# Patient Record
Sex: Female | Born: 1960 | Race: White | Hispanic: No | Marital: Married | Smoking: Never smoker
Health system: Southern US, Community
[De-identification: ages and names within clinical notes are randomized; demographics above are authoritative.]

## PROBLEM LIST (undated history)

## (undated) DIAGNOSIS — M545 Low back pain, unspecified: Secondary | ICD-10-CM

## (undated) DIAGNOSIS — Z5189 Encounter for other specified aftercare: Secondary | ICD-10-CM

## (undated) DIAGNOSIS — R7303 Prediabetes: Secondary | ICD-10-CM

## (undated) DIAGNOSIS — N189 Chronic kidney disease, unspecified: Secondary | ICD-10-CM

## (undated) DIAGNOSIS — D649 Anemia, unspecified: Secondary | ICD-10-CM

## (undated) DIAGNOSIS — E785 Hyperlipidemia, unspecified: Secondary | ICD-10-CM

## (undated) DIAGNOSIS — E039 Hypothyroidism, unspecified: Secondary | ICD-10-CM

## (undated) DIAGNOSIS — M1990 Unspecified osteoarthritis, unspecified site: Secondary | ICD-10-CM

## (undated) DIAGNOSIS — T7840XA Allergy, unspecified, initial encounter: Secondary | ICD-10-CM

## (undated) DIAGNOSIS — E559 Vitamin D deficiency, unspecified: Secondary | ICD-10-CM

## (undated) DIAGNOSIS — I10 Essential (primary) hypertension: Secondary | ICD-10-CM

## (undated) DIAGNOSIS — R600 Localized edema: Secondary | ICD-10-CM

## (undated) DIAGNOSIS — E079 Disorder of thyroid, unspecified: Secondary | ICD-10-CM

## (undated) DIAGNOSIS — M549 Dorsalgia, unspecified: Secondary | ICD-10-CM

## (undated) HISTORY — DX: Encounter for other specified aftercare: Z51.89

## (undated) HISTORY — DX: Dorsalgia, unspecified: M54.9

## (undated) HISTORY — PX: TUBAL LIGATION: SHX77

## (undated) HISTORY — DX: Vitamin D deficiency, unspecified: E55.9

## (undated) HISTORY — DX: Localized edema: R60.0

## (undated) HISTORY — PX: SPINE SURGERY: SHX786

## (undated) HISTORY — DX: Hyperlipidemia, unspecified: E78.5

## (undated) HISTORY — DX: Disorder of thyroid, unspecified: E07.9

## (undated) HISTORY — PX: ABDOMINAL HYSTERECTOMY: SHX81

## (undated) HISTORY — DX: Prediabetes: R73.03

## (undated) HISTORY — DX: Essential (primary) hypertension: I10

## (undated) HISTORY — DX: Allergy, unspecified, initial encounter: T78.40XA

## (undated) HISTORY — DX: Hypothyroidism, unspecified: E03.9

## (undated) HISTORY — PX: MENISCUS REPAIR: SHX5179

## (undated) HISTORY — DX: Low back pain, unspecified: M54.50

## (undated) HISTORY — DX: Chronic kidney disease, unspecified: N18.9

## (undated) HISTORY — DX: Anemia, unspecified: D64.9

---
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
## 1999-02-05 ENCOUNTER — Other Ambulatory Visit: Admission: RE | Admit: 1999-02-05 | Discharge: 1999-02-05 | Payer: Self-pay | Admitting: Obstetrics & Gynecology

## 2000-02-05 ENCOUNTER — Other Ambulatory Visit: Admission: RE | Admit: 2000-02-05 | Discharge: 2000-02-05 | Payer: Self-pay | Admitting: Obstetrics and Gynecology

## 2001-03-14 ENCOUNTER — Other Ambulatory Visit: Admission: RE | Admit: 2001-03-14 | Discharge: 2001-03-14 | Payer: Self-pay | Admitting: Obstetrics and Gynecology

## 2001-07-27 ENCOUNTER — Other Ambulatory Visit: Admission: RE | Admit: 2001-07-27 | Discharge: 2001-07-27 | Payer: Self-pay | Admitting: Obstetrics and Gynecology

## 2003-03-28 ENCOUNTER — Other Ambulatory Visit: Admission: RE | Admit: 2003-03-28 | Discharge: 2003-03-28 | Payer: Self-pay | Admitting: Obstetrics and Gynecology

## 2004-12-03 ENCOUNTER — Emergency Department (HOSPITAL_COMMUNITY): Admission: EM | Admit: 2004-12-03 | Discharge: 2004-12-03 | Payer: Self-pay | Admitting: Emergency Medicine

## 2004-12-09 ENCOUNTER — Other Ambulatory Visit: Admission: RE | Admit: 2004-12-09 | Discharge: 2004-12-09 | Payer: Self-pay | Admitting: Obstetrics and Gynecology

## 2005-01-26 ENCOUNTER — Ambulatory Visit: Payer: Self-pay | Admitting: Cardiology

## 2005-12-21 ENCOUNTER — Other Ambulatory Visit: Admission: RE | Admit: 2005-12-21 | Discharge: 2005-12-21 | Payer: Self-pay | Admitting: Obstetrics and Gynecology

## 2006-11-16 HISTORY — PX: TOTAL VAGINAL HYSTERECTOMY: SHX2548

## 2007-11-18 ENCOUNTER — Ambulatory Visit (HOSPITAL_COMMUNITY): Admission: RE | Admit: 2007-11-18 | Discharge: 2007-11-19 | Payer: Self-pay | Admitting: Obstetrics and Gynecology

## 2007-11-18 ENCOUNTER — Encounter (INDEPENDENT_AMBULATORY_CARE_PROVIDER_SITE_OTHER): Payer: Self-pay | Admitting: Obstetrics and Gynecology

## 2010-12-17 HISTORY — PX: OTHER SURGICAL HISTORY: SHX169
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
## 2011-03-31 NOTE — Discharge Summary (Signed)
[name], [name]             ACCOUNT NO.:  [id number]   MEDICAL RECORD NO.:  [id number]          PATIENT TYPE:  OIB   LOCATION:  9319                          [facility]ILITY:  [facility]   PHYSICIAN:  Guy Sandifer. Henderson Cloud, M.D. DATE OF BIRTH:  Jan 04, 1961   DATE OF ADMISSION:  11/18/2007  DATE OF DISCHARGE:  11/19/2007                               DISCHARGE SUMMARY   ADMITTING DIAGNOSIS:  Uterine leiomyomata.   DISCHARGE DIAGNOSIS:  Uterine leiomyomata.   PROCEDURE:  On November 18, 2007 is laparoscopically-assisted vaginal  hysterectomy with aspiration of right ovarian cyst.   REASON FOR ADMISSION:  This patient is a 50 year old married white  female G2, P1 status post tubal ligation with increasingly heavy menses.  Details are dictated in the history and physical.  She is admitted for  surgical management.   HOSPITAL COURSE:  The patient is admitted to hospital and undergoes the  above procedure.  On the evening of the surgery, she has good pain  relief.  Vital signs are stable.  She is afebrile with clear urine  output.  On the day of discharge, she is feeling good.  She is  ambulating, passing flatus, tolerating a regular diet, and voiding.  Vital signs are stable.  She remains afebrile.  Hemoglobin of 11.7 and  pathology is pending.   CONDITION ON DISCHARGE:  Good.   DIET:  Regular as tolerated.   ACTIVITY:  No lifting, operation of automobiles, no vaginal entry.   PLAN:  She is to call the office for problems including, but not limited  to temperature of 101 degrees, persistent nausea, vomiting, heavy  bleeding or increasing pain.   MEDICATIONS:  Percocet 5/325 mg #30 1 to 2 p.o. q.6 hours p.r.n.,  ibuprofen 800 mg q.8 hours p.r.n., multivitamin daily.  Followup is in  the office in 2 weeks with Dr. Henderson Cloud.      Guy Sandifer Henderson Cloud, M.D.  Electronically Signed     JET/MEDQ  D:  11/19/2007  T:  11/19/2007  Job:  [id number]

## 2011-03-31 NOTE — Op Note (Signed)
[name], [name]             ACCOUNT NO.:  [id number]   MEDICAL RECORD NO.:  [id number]          PATIENT TYPE:  AMB   LOCATION:  SDC                           [facility]   PHYSICIAN:  Guy Sandifer. Henderson Cloud, M.D. DATE OF BIRTH:  1961/05/16   DATE OF PROCEDURE:  11/18/2007  DATE OF DISCHARGE:                               OPERATIVE REPORT   PREOPERATIVE DIAGNOSIS:  Uterine leiomyomata.   POSTOPERATIVE DIAGNOSES:  1. Uterine leiomyomata.  2. Right ovarian cyst.   PROCEDURE:  Laparoscopically-assisted vaginal hysterectomy and  aspiration of right ovarian cyst.   SURGEON:  Guy Sandifer. Henderson Cloud, MD   ASSISTANT:  Freddy Finner, MD   ANESTHESIA:  General with endotracheal intubation.   ESTIMATED BLOOD LOSS:  100 mL.   SPECIMENS:  Uterus and aspirate of right ovarian cyst, both to  pathology.   INDICATIONS AND CONSENT:  This patient is a 50 year old married white  female G2, P1, status post tubal ligation with increasingly heavy  menses.  Details are dictated in the history and physical.  Laparoscopically-assisted vaginal hysterectomy and removal of an ovary  only if distinctly abnormal have been discussed preoperatively.  Potential risks and complications have been discussed including, but not  limited to, infection, organ damage, bleeding with transfusion of blood  products, HIV and hepatitis acquisition, DVT, PE, pneumonia, fistula  formation, laparotomy, postoperative pain or dyspareunia.  All questions  have been answered and consent is signed on the chart.   FINDINGS:  Upper abdomen is grossly normal.  Uterus is about 8 weeks in  size, irregular in contour with uterine leiomyomata.  The right ovary  has a 1-cm, smooth, translucent cyst.  The left ovary is normal.  Tubes  are status post ligation bilaterally.  Anterior and posterior cul-de-  sacs are normal.   PROCEDURE:  The patient was taken to the operating room where she was  identified, placed in the dorsal supine  position and general anesthesia  was induced via endotracheal intubation.  She was then placed in the  dorsal lithotomy position where she was prepped abdominally and  vaginally.  The bladder was straight catheterized.  A Hulka tenaculum  was placed in the uterus as a manipulator, and she was draped in a  sterile fashion.  The infraumbilical and suprapubic areas were injected  in the midline with 0.5% plain Marcaine.  A small infraumbilical  incision was then made and a disposable Veress needle was placed on the  first attempt without difficulty.  Placement was verified with the  syringe and drop test, and 2 liters of gas were insufflated under low  pressure with good tympany in the right upper quadrant.  The Veress  needle was removed and a 10/11 XL bladeless disposable trocar sleeve was  placed using direct visualization with the diagnostic laparoscopic.  After placement, the operative laparoscope was placed.  A small  suprapubic incision is made in the midline, and a 5-mm XL bladeless  disposable trocar sleeve was placed under direct visualization without  difficulty.  The above findings were noted.  Using the gyrus bipolar  cautery cutting instrument,  the proximal ligaments were taken down  bilaterally to the level of the vesicouterine peritoneum.  The  vesicouterine peritoneum was taken down sharply.  Good hemostasis was  maintained.  The suprapubic trocar sleeve was removed, the instruments  were removed, and attention was turned to the vagina.  The posterior cul-  de-sac was entered sharply and the cervix was circumscribed with  unipolar cautery.  The mucosa was advanced sharply and bluntly.  Then  using the gyrus bipolar cautery instrument, the uterosacral ligaments  followed by the cardinal ligaments and the uterine vessels were taken  bilaterally.  The fundus was delivered posteriorly, the proximal  ligaments were taken down, and the specimen was delivered.  The  uterosacral  ligaments were then plicated to the vaginal cuff bilaterally  with separate sutures of zero Monocryl.  All suture will be zero  Monocryl unless otherwise designated.  The uterosacral ligaments were  then plicated in the midline with a third suture.  The cuff was closed  with figure-of-eights.  A Foley catheter was placed in the bladder as a  drain, and clear urine was noted.  Attention was returned to the  abdomen.  A pneumoperitoneum was reintroduced.  The suprapubic trocar  sleeve was reintroduced under direct visualization.  Copious irrigation  revealed minor oozing at peritoneal edges which was controlled with  bipolar cautery.  The right ovary was then aspirated for approximately 3  mL of straw-colored serous fluid which was sent to cytology.  Good  hemostasis was noted all around.  Excess fluid was removed.  Suprapubic  trocar sleeve was removed.  Pneumoperitoneum was reduced and the  umbilical trocar sleeve was removed.  The umbilical incision was closed  with interrupted skin stitches of 3-0 Vicryl.  Dermabond was placed on  both incisions.  All counts were correct.  The patient was awakened and  taken to the recovery room in stable condition.      Guy Sandifer Henderson Cloud, M.D.  Electronically Signed     JET/MEDQ  D:  11/18/2007  T:  11/18/2007  Job:  [id number]

## 2011-08-06 LAB — CBC
HCT: 33.4 — ABNORMAL LOW
Platelets: 331
RBC: 3.49 — ABNORMAL LOW
WBC: 13.8 — ABNORMAL HIGH

## 2011-08-21 LAB — COMPREHENSIVE METABOLIC PANEL
Alkaline Phosphatase: 59
BUN: 14
Chloride: 104
Creatinine, Ser: 0.8
Glucose, Bld: 95
Potassium: 4.1
Total Bilirubin: 0.8
Total Protein: 6.6

## 2011-08-21 LAB — CBC
HCT: 40
Hemoglobin: 13.8
MCV: 95.6
RDW: 13.8

## 2011-08-21 LAB — URINALYSIS, ROUTINE W REFLEX MICROSCOPIC
Nitrite: NEGATIVE
Protein, ur: NEGATIVE

## 2012-01-08 ENCOUNTER — Other Ambulatory Visit: Payer: Self-pay

## 2012-01-08 MED ORDER — ENALAPRIL MALEATE 10 MG PO TABS: 10.0000 mg | ORAL_TABLET | Freq: Every day | ORAL | Status: DC

## 2012-02-10 ENCOUNTER — Other Ambulatory Visit: Payer: Self-pay | Admitting: *Deleted

## 2012-02-10 MED ORDER — METOPROLOL SUCCINATE ER 25 MG PO TB24: 25.0000 mg | ORAL_TABLET | Freq: Every day | ORAL | Status: DC

## 2012-06-10 ENCOUNTER — Ambulatory Visit (INDEPENDENT_AMBULATORY_CARE_PROVIDER_SITE_OTHER): Payer: BC Managed Care – PPO | Admitting: Internal Medicine

## 2012-06-10 VITALS — BP 122/76 | HR 75 | Temp 98.0°F | Resp 16 | Ht 60.0 in | Wt 187.0 lb

## 2012-06-10 DIAGNOSIS — S61409A Unspecified open wound of unspecified hand, initial encounter: Secondary | ICD-10-CM

## 2012-06-10 DIAGNOSIS — I10 Essential (primary) hypertension: Secondary | ICD-10-CM

## 2012-06-10 DIAGNOSIS — E039 Hypothyroidism, unspecified: Secondary | ICD-10-CM

## 2012-06-10 NOTE — Progress Notes (Signed)
Patient ID: [name] MRN: [id number], DOB: 1961-09-21, 51 y.o. Date of Encounter: 06/10/2012, 11:03 AM   PROCEDURE NOTE: Superficial wound of right thenar eminence with smaller superficial laceration adjacent.  Verbal consent obtained. Sterile technique employed. Numbing: Anesthesia obtained with 2% lidocaine plain.   Cleansed with soap and water. Irrigated.  Wound explored, no deep structures involved, no foreign bodies.   Wound #1 repaired with # 5 SI and #2 HM using 4-0 ethilon.  Wound #2 repaired with #1 SI suture.  Hemostasis obtained. Wound cleansed and dressed.  Wound care instructions including precautions covered with patient. Handout given.  Anticipate suture removal in 7-10 days  Rhoderick Moody, PA-C 06/10/2012 11:03 AM

## 2012-06-10 NOTE — Progress Notes (Signed)
  Subjective:    Patient ID: [name], female    DOB: 19-Dec-1960, 51 y.o.   MRN: [id number]  HPI  At home cut hand on glass in sink that broke See scanned hand sheet Tdap 2010 Review of Systems     Objective:   Physical Exam See scanned hand sheet Repair wound       Assessment & Plan:  Wound care

## 2012-06-10 NOTE — Patient Instructions (Addendum)

## 2012-06-14 ENCOUNTER — Other Ambulatory Visit: Payer: Self-pay | Admitting: Obstetrics and Gynecology

## 2012-06-14 DIAGNOSIS — R928 Other abnormal and inconclusive findings on diagnostic imaging of breast: Secondary | ICD-10-CM

## 2012-06-18 ENCOUNTER — Ambulatory Visit (INDEPENDENT_AMBULATORY_CARE_PROVIDER_SITE_OTHER): Payer: BC Managed Care – PPO | Admitting: Physician Assistant

## 2012-06-18 VITALS — BP 125/80 | HR 81 | Temp 98.0°F | Resp 16 | Ht 60.0 in | Wt 189.0 lb

## 2012-06-18 DIAGNOSIS — S61009A Unspecified open wound of unspecified thumb without damage to nail, initial encounter: Secondary | ICD-10-CM

## 2012-06-18 DIAGNOSIS — S61209A Unspecified open wound of unspecified finger without damage to nail, initial encounter: Secondary | ICD-10-CM

## 2012-06-18 NOTE — Progress Notes (Signed)
  Subjective:    Patient ID: [name], female    DOB: 05-Nov-1961, 51 y.o.   MRN: [id number]  HPI  Pt presents to clinic for suture removal. She has had some redness around the wound and tightness.  Review of Systems     Objective:   Physical Exam   Sutures removed from well healed wound.  There is some erythema from stitch reaction but no signs of infection.     Assessment & Plan:  Wound thumb - well healed - wound care d/w pt.

## 2012-06-20 ENCOUNTER — Ambulatory Visit
Admission: RE | Admit: 2012-06-20 | Discharge: 2012-06-20 | Disposition: A | Discharge status: Home / Self Care | Payer: BC Managed Care – PPO | Source: Ambulatory Visit | Attending: Obstetrics and Gynecology | Admitting: Obstetrics and Gynecology

## 2012-06-20 DIAGNOSIS — R928 Other abnormal and inconclusive findings on diagnostic imaging of breast: Secondary | ICD-10-CM

## 2012-07-05 ENCOUNTER — Encounter: Payer: Self-pay | Admitting: Internal Medicine

## 2012-07-19 ENCOUNTER — Other Ambulatory Visit: Payer: Self-pay | Admitting: Physician Assistant

## 2012-09-16 ENCOUNTER — Other Ambulatory Visit: Payer: Self-pay | Admitting: Family Medicine

## 2012-09-16 NOTE — Telephone Encounter (Signed)
Chart pulled to PA pool at nurses station [phone]

## 2012-09-16 NOTE — Telephone Encounter (Signed)
Please pull chart for lab review

## 2012-09-28 ENCOUNTER — Other Ambulatory Visit: Payer: Self-pay | Admitting: Family Medicine

## 2012-11-07 ENCOUNTER — Other Ambulatory Visit: Payer: Self-pay | Admitting: Physician Assistant

## 2012-12-18 ENCOUNTER — Ambulatory Visit (INDEPENDENT_AMBULATORY_CARE_PROVIDER_SITE_OTHER): Payer: BC Managed Care – PPO | Admitting: Family Medicine

## 2012-12-18 VITALS — BP 132/83 | HR 102 | Temp 98.5°F | Resp 17 | Ht 60.5 in | Wt 191.0 lb

## 2012-12-18 DIAGNOSIS — E079 Disorder of thyroid, unspecified: Secondary | ICD-10-CM

## 2012-12-18 DIAGNOSIS — E039 Hypothyroidism, unspecified: Secondary | ICD-10-CM

## 2012-12-18 DIAGNOSIS — Q851 Tuberous sclerosis: Secondary | ICD-10-CM | POA: Insufficient documentation

## 2012-12-18 DIAGNOSIS — Z23 Encounter for immunization: Secondary | ICD-10-CM

## 2012-12-18 LAB — POCT CBC
Granulocyte percent: 55.3 %G (ref 37–80)
HCT, POC: 47 % (ref 37.7–47.9)
MCHC: 32.8 g/dL (ref 31.8–35.4)
MID (cbc): 0.4 (ref 0–0.9)
MPV: 8.9 fL (ref 0–99.8)
POC Granulocyte: 3.8 (ref 2–6.9)
POC LYMPH PERCENT: 38.6 %L (ref 10–50)
POC MID %: 6.1 %M (ref 0–12)
Platelet Count, POC: 394 10*3/uL (ref 142–424)
RDW, POC: 14.2 %

## 2012-12-18 MED ORDER — LEVOTHYROXINE SODIUM 112 MCG PO TABS: 112.0000 ug | ORAL_TABLET | Freq: Every day | ORAL | Status: DC

## 2012-12-18 NOTE — Progress Notes (Signed)
[address]   [place], [place]  [address]   [phone]  Subjective:    Patient ID: [name], female    DOB: [birth year], 52 y.o.   MRN: [id number]  HPI This 52 y.o. female presents for evaluation of the following:  1.  Hypothyroidism: diagnosed since mid 30s.  Family history of thyroid abnormalities. No history of goiter.  Symptomatic. Continue to have symptoms despite normal levels.  No bradycardia; no constipation.  Feels much better slightly over medicated.  +thinning hair.  2.  Health Maintenance: Pap smear 08/2012 [place].  Mammogram 08/2012.  Fecal occult blood 08/2012.  TDAP 06/2009.  Flu vaccines sporadic; 2013.  No colonoscopy.  Bone density 2005.  Eye exam 2013; +glasses.  Dental exam every six months.    3. 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  Followed by nephrology at [facility]. Looking for dermatologist to remove various facial skin lesions.  Normal BUN/Cr.  Review of Systems  Constitutional: Positive for fatigue. Negative for fever, chills and diaphoresis.  Respiratory: Negative for shortness of breath and wheezing.   Cardiovascular: Negative for chest pain, palpitations and leg swelling.  Skin: Positive for color change.        Past Medical History  Diagnosis Date  . Hypertension   . Thyroid disease   . Chronic kidney disease     Tuberous Sclerosis; [facility] at [facility].  [name] Allergy     dust mite allergies; Zyrtec.  [name] Blood transfusion without reported diagnosis     post-operatively in 20s after L1 fracture with spinal cord injury after horseback riding.  [name] Hyperlipidemia     Past Surgical History  Procedure Date  . Tubal ligation   . Cesarean section   . Abdominal hysterectomy     DUB; ovaries remaining.  [name] Spine surgery     L1 fracture with spinal cord injury with horseback riding.    Prior to Admission medications   Medication Sig Start Date End Date Taking? Authorizing Provider  enalapril (VASOTEC) 10 MG tablet TAKE 3 TABLETS  EVERY DAY 07/19/12  Yes Ryan M Dunn, PA-C  levothyroxine (SYNTHROID, LEVOTHROID) 112 MCG tablet Take 1 tablet (112 mcg total) by mouth daily. Need office visit & labs for additional refills. SECOND NOTICE. 11/07/12  Yes Chelle Tessa Lerner, PA-C    Allergies  Allergen Reactions  . Sulfa Antibiotics Hives    History   Social History  . Marital Status: Married    Spouse Name: N/A    Number of Children: N/A  . Years of Education: N/A   Occupational History  . Not on file.   Social History Main Topics  . Smoking status: Never Smoker   . Smokeless tobacco: Not on file  . Alcohol Use: No  . Drug Use: No  . Sexually Active: Yes    Birth Control/ Protection: None   Other Topics Concern  . Not on file   Social History Narrative   Marital status: married x 20+ years; happily married.   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   Tobacco; none   Alcohol:  Socially   Drugs: none   Exercise: sporadic    Family History  Problem Relation Age of Onset  . Thyroid disease Mother   . Arthritis Mother   . Asthma Mother   . Migraines Mother   . Heart disease Father 44    AMI age  80; CABG at 85.  . Arthritis Father     Objective:   Physical Exam  Nursing note and vitals reviewed. Constitutional: She is oriented to person, place, and time. She appears well-developed and well-nourished. No distress.  HENT:  Mouth/Throat: Oropharynx is clear and moist.  Eyes: Conjunctivae normal are normal. Pupils are equal, round, and reactive to light.  Neck: Normal range of motion. Neck supple. No thyromegaly present.  Cardiovascular: Normal rate, regular rhythm and normal heart sounds.  Exam reveals no gallop and no friction rub.   No murmur heard. Pulmonary/Chest: Effort normal and breath sounds normal. She has no wheezes. She has no rales.  Lymphadenopathy:    She has no cervical adenopathy.  Neurological: She is alert  and oriented to person, place, and time. She has normal reflexes. No cranial nerve deficit. She exhibits normal muscle tone. Coordination normal.  Skin: She is not diaphoretic.  Psychiatric: She has a normal mood and affect. Her behavior is normal. Judgment and thought content normal.    INFLUENZA VACCINE ADMINISTERED.      Results for orders placed in visit on 12/18/12  POCT CBC      Component Value Range   WBC 6.9  4.6 - 10.2 K/uL   Lymph, poc 2.7  0.6 - 3.4   POC LYMPH PERCENT 38.6  10 - 50 %L   MID (cbc) 0.4  0 - 0.9   POC MID % 6.1  0 - 12 %M   POC Granulocyte 3.8  2 - 6.9   Granulocyte percent 55.3  37 - 80 %G   RBC 4.81  4.04 - 5.48 M/uL   Hemoglobin 15.4  12.2 - 16.2 g/dL   HCT, POC 16.1  09.6 - 47.9 %   MCV 97.7 (*) 80 - 97 fL   MCH, POC 32.0 (*) 27 - 31.2 pg   MCHC 32.8  31.8 - 35.4 g/dL   RDW, POC 04.5     Platelet Count, POC 394  142 - 424 K/uL   MPV 8.9  0 - 99.8 fL    Assessment & Plan:   1. Flu vaccine need  Flu vaccine greater than or equal to 3yo preservative free IM  2. Thyroid disease  POCT CBC, TSH, T4, Free  3. Hypothyroidism    4. Tuberous sclerosis       1.  Hypothyroidism:  Stable; obtain labs; refill provided. 2.  S/p flu vaccine in office. 3.  Tuberous Sclerosis:  Stable; followed by nephrology at [facility] yearly.

## 2012-12-18 NOTE — Patient Instructions (Addendum)
1. Flu vaccine need  Flu vaccine greater than or equal to 53yo preservative free IM  2. Thyroid disease  POCT CBC, TSH, T4, Free  3. Hypothyroidism    4. Tuberous sclerosis

## 2012-12-19 LAB — T4, FREE: Free T4: 1.42 ng/dL (ref 0.80–1.80)

## 2012-12-19 LAB — TSH: TSH: 0.324 u[IU]/mL — ABNORMAL LOW (ref 0.350–4.500)

## 2012-12-19 MED ORDER — LEVOTHYROXINE SODIUM 112 MCG PO TABS: 112.0000 ug | ORAL_TABLET | Freq: Every day | ORAL | Status: DC

## 2012-12-19 NOTE — Addendum Note (Signed)
Addended by: Ethelda Chick on: 12/19/2012 02:58 PM   Modules accepted: Orders

## 2013-03-02 ENCOUNTER — Encounter: Payer: Self-pay | Admitting: Emergency Medicine

## 2013-08-08 ENCOUNTER — Ambulatory Visit (INDEPENDENT_AMBULATORY_CARE_PROVIDER_SITE_OTHER): Payer: BC Managed Care – PPO | Admitting: Family Medicine

## 2013-08-08 VITALS — BP 126/74 | HR 77 | Temp 98.1°F | Resp 18 | Ht 60.5 in | Wt 171.0 lb

## 2013-08-08 DIAGNOSIS — M549 Dorsalgia, unspecified: Secondary | ICD-10-CM

## 2013-08-08 DIAGNOSIS — Q851 Tuberous sclerosis: Secondary | ICD-10-CM

## 2013-08-08 MED ORDER — ENALAPRIL MALEATE 20 MG PO TABS: 20.0000 mg | ORAL_TABLET | Freq: Two times a day (BID) | ORAL | Status: DC

## 2013-08-08 NOTE — Progress Notes (Signed)
52 yo woman with tuberous sclerosis who needs refill on enalapril 10 mg 2 tablets twice daily.  She's been on the med per nephrologist for years.  She has not gotten a refill or call back from her nephrologist for last several days since she ran out of meds.  Objective:  NAD Chest:  Clear Heart:  Reg, no murmur BP 125/84  Assessment:  Tuberous sclerosis with need for protection for kidneys  Plan:  Tuberous sclerosis - Plan: enalapril (VASOTEC) 20 MG tablet  Signed, Elvina Sidle, MD  Also having low back pains x 6 months.  Pain radiates down each leg from time to time.  She has received PT in past. She has been diagnosed with spinal stenosis She has lost 15 lbs.  Obj:  NAD Good reflexes bilaterally SLR:  Negative  Assessment:  Chronic LBP  Plan:  Referral to n'surg.

## 2013-09-21 ENCOUNTER — Other Ambulatory Visit: Payer: Self-pay

## 2013-10-05 DIAGNOSIS — M48061 Spinal stenosis, lumbar region without neurogenic claudication: Secondary | ICD-10-CM | POA: Insufficient documentation

## 2013-11-03 ENCOUNTER — Other Ambulatory Visit: Payer: Self-pay | Admitting: Obstetrics and Gynecology

## 2013-11-03 DIAGNOSIS — Z1231 Encounter for screening mammogram for malignant neoplasm of breast: Secondary | ICD-10-CM

## 2013-12-18 ENCOUNTER — Ambulatory Visit
Admission: RE | Admit: 2013-12-18 | Discharge: 2013-12-18 | Disposition: A | Discharge status: Home / Self Care | Payer: BC Managed Care – PPO | Source: Ambulatory Visit | Attending: Obstetrics and Gynecology | Admitting: Obstetrics and Gynecology

## 2013-12-18 DIAGNOSIS — Z1231 Encounter for screening mammogram for malignant neoplasm of breast: Secondary | ICD-10-CM

## 2013-12-20 ENCOUNTER — Other Ambulatory Visit: Payer: Self-pay | Admitting: Obstetrics and Gynecology

## 2013-12-20 DIAGNOSIS — R928 Other abnormal and inconclusive findings on diagnostic imaging of breast: Secondary | ICD-10-CM

## 2013-12-27 ENCOUNTER — Ambulatory Visit
Admission: RE | Admit: 2013-12-27 | Discharge: 2013-12-27 | Disposition: A | Discharge status: Home / Self Care | Payer: BC Managed Care – PPO | Source: Ambulatory Visit | Attending: Obstetrics and Gynecology | Admitting: Obstetrics and Gynecology

## 2013-12-27 DIAGNOSIS — R928 Other abnormal and inconclusive findings on diagnostic imaging of breast: Secondary | ICD-10-CM

## 2014-01-26 ENCOUNTER — Other Ambulatory Visit: Payer: Self-pay | Admitting: Family Medicine

## 2014-02-15 ENCOUNTER — Ambulatory Visit (INDEPENDENT_AMBULATORY_CARE_PROVIDER_SITE_OTHER): Payer: BC Managed Care – PPO | Admitting: Emergency Medicine

## 2014-02-15 VITALS — BP 120/84 | HR 90 | Temp 98.2°F | Resp 16 | Ht 60.75 in | Wt 184.8 lb

## 2014-02-15 DIAGNOSIS — R5381 Other malaise: Secondary | ICD-10-CM

## 2014-02-15 DIAGNOSIS — J018 Other acute sinusitis: Secondary | ICD-10-CM

## 2014-02-15 DIAGNOSIS — R5383 Other fatigue: Secondary | ICD-10-CM

## 2014-02-15 DIAGNOSIS — I10 Essential (primary) hypertension: Secondary | ICD-10-CM

## 2014-02-15 DIAGNOSIS — E669 Obesity, unspecified: Secondary | ICD-10-CM | POA: Insufficient documentation

## 2014-02-15 LAB — POCT URINALYSIS DIPSTICK
BILIRUBIN UA: NEGATIVE
Blood, UA: NEGATIVE
Glucose, UA: NEGATIVE
Ketones, UA: NEGATIVE
LEUKOCYTES UA: NEGATIVE
Nitrite, UA: NEGATIVE
PROTEIN UA: NEGATIVE
Spec Grav, UA: 1.02
Urobilinogen, UA: 0.2
pH, UA: 5.5

## 2014-02-15 LAB — POCT CBC
GRANULOCYTE PERCENT: 50.3 % (ref 37–80)
HEMATOCRIT: 47.6 % (ref 37.7–47.9)
Hemoglobin: 15.6 g/dL (ref 12.2–16.2)
Lymph, poc: 3.1 (ref 0.6–3.4)
MCH, POC: 32.2 pg — AB (ref 27–31.2)
MCHC: 32.6 g/dL (ref 31.8–35.4)
MCV: 98.7 fL — AB (ref 80–97)
MID (cbc): 0.4 (ref 0–0.9)
MPV: 9.2 fL (ref 0–99.8)
POC Granulocyte: 3.5 (ref 2–6.9)
POC LYMPH PERCENT: 44.4 %L (ref 10–50)
POC MID %: 5.3 %M (ref 0–12)
Platelet Count, POC: 400 10*3/uL (ref 142–424)
RBC: 4.82 M/uL (ref 4.04–5.48)
RDW, POC: 13.2 %
WBC: 7 10*3/uL (ref 4.6–10.2)

## 2014-02-15 LAB — POCT UA - MICROSCOPIC ONLY
BACTERIA, U MICROSCOPIC: NEGATIVE
CASTS, UR, LPF, POC: NEGATIVE
Crystals, Ur, HPF, POC: NEGATIVE
Epithelial cells, urine per micros: NEGATIVE
Mucus, UA: NEGATIVE
RBC, urine, microscopic: NEGATIVE
WBC, Ur, HPF, POC: NEGATIVE
YEAST UA: NEGATIVE

## 2014-02-15 LAB — LIPID PANEL
Cholesterol: 274 mg/dL — ABNORMAL HIGH (ref 0–200)
HDL: 55 mg/dL (ref >39)
LDL CALC: 185 mg/dL — AB (ref 0–99)
Total CHOL/HDL Ratio: 5 Ratio
Triglycerides: 168 mg/dL — ABNORMAL HIGH (ref <150)
VLDL: 34 mg/dL (ref 0–40)

## 2014-02-15 LAB — TSH: TSH: 5.472 u[IU]/mL — ABNORMAL HIGH (ref 0.350–4.500)

## 2014-02-15 LAB — COMPREHENSIVE METABOLIC PANEL
ALBUMIN: 4.8 g/dL (ref 3.5–5.2)
ALK PHOS: 67 U/L (ref 39–117)
ALT: 23 U/L (ref 0–35)
AST: 20 U/L (ref 0–37)
BUN: 13 mg/dL (ref 6–23)
CHLORIDE: 104 meq/L (ref 96–112)
CO2: 26 mEq/L (ref 19–32)
Calcium: 10 mg/dL (ref 8.4–10.5)
Creat: 0.88 mg/dL (ref 0.50–1.10)
Glucose, Bld: 93 mg/dL (ref 70–99)
Potassium: 4.2 mEq/L (ref 3.5–5.3)
SODIUM: 138 meq/L (ref 135–145)
TOTAL PROTEIN: 7.4 g/dL (ref 6.0–8.3)
Total Bilirubin: 0.7 mg/dL (ref 0.2–1.2)

## 2014-02-15 MED ORDER — GUAIFENESIN-DM 100-10 MG/5ML PO SYRP: 5.0000 mL | ORAL_SOLUTION | ORAL | Status: DC | PRN

## 2014-02-15 MED ORDER — AMOXICILLIN-POT CLAVULANATE 875-125 MG PO TABS: 1.0000 | ORAL_TABLET | Freq: Two times a day (BID) | ORAL | Status: DC

## 2014-02-15 MED ORDER — PSEUDOEPHEDRINE-GUAIFENESIN ER 60-600 MG PO TB12: 1.0000 | ORAL_TABLET | Freq: Two times a day (BID) | ORAL | Status: DC

## 2014-02-15 NOTE — Patient Instructions (Signed)

## 2014-02-15 NOTE — Progress Notes (Signed)
[facility] [address] [place] [address] [phone]  Date:  02/15/2014   Name:  [name]   DOB:  02-09-1961   MRN:  [id number]  PCP:  Tally Due, MD    Chief Complaint: Fatigue and Medication Refill   History of Present Illness:  [name] is a 53 y.o. very pleasant female patient who presents with the following:  Concerned about lack of energy.  Has nasal congestion, post nasal drip.  No nasal drainage.  Sore throat.  Non productive cough.  No wheezing or shortness of breath.  No fever or chills.  No nausea or vomiting.  No stool change or rash.  Tolerating her meds well with no adverse side effect.  No improvement with over the counter medications or other home remedies. Denies other complaint or health concern today.   Patient Active Problem List   Diagnosis Date Noted  . 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 Hypothyroid 06/10/2012  . HTN (hypertension) 06/10/2012    Past Medical History  Diagnosis Date  . Hypertension   . Thyroid disease   . Chronic kidney disease     Tuberous Sclerosis; B[facility]er at [facility].  [name] Allergy     dust mite allergies; Zyrtec.  [name] Blood transfusion without reported diagnosis     post-operatively in 20s after L1 fracture with spinal cord injury after horseback riding.  [name] Hyperlipidemia     Past Surgical History  Procedure Laterality Date  . Tubal ligation    . Cesarean section    . Abdominal hysterectomy      DUB; ovaries remaining.  [name] Spine surgery      L1 fracture with spinal cord injury with horseback riding.  . Sleep study  12/17/2010    No sleep disordered breathing or periodic limb mvmets.  Excess light sleep.    History  Substance Use Topics  . Smoking status: Never Smoker   . Smokeless tobacco: Not on file  . Alcohol Use: No    Family History  Problem Relation Age of Onset  . Thyroid disease Mother   . Arthritis Mother   . Asthma Mother   . Migraines Mother   . Heart disease  Father 61    AMI age 76; CABG at 27.  . Arthritis Father     Allergies  Allergen Reactions  . Sulfa Antibiotics Hives    Medication list has been reviewed and updated.  Current Outpatient Prescriptions on File Prior to Visit  Medication Sig Dispense Refill  . enalapril (VASOTEC) 20 MG tablet Take 1 tablet (20 mg total) by mouth 2 (two) times daily.  180 tablet  3  . levothyroxine (SYNTHROID, LEVOTHROID) 112 MCG tablet Take 1 tablet (112 mcg total) by mouth daily. PATIENT NEEDS OFFICE VISIT FOR ADDITIONAL REFILLS  30 tablet  0   No current facility-administered medications on file prior to visit.    Review of Systems:  As per HPI, otherwise negative.    Physical Examination: Filed Vitals:   02/15/14 1008  BP: 120/84  Pulse: 90  Temp: 98.2 F (36.8 C)  Resp: 16   Filed Vitals:   02/15/14 1008  Height: 5' 0.75" (1.543 m)  Weight: 184 lb 12.8 oz (83.825 kg)   Body mass index is 35.21 kg/(m^2). Ideal Body Weight: Weight in (lb) to have BMI = 25: 131  GEN: WDWN, NAD, Non-toxic, A & O x 3 HEENT: Atraumatic, Normocephalic. Neck supple. No masses, No LAD. Ears and  Nose: No external deformity.  Purulent nasal drainage. CV: RRR, No M/G/R. No JVD. No thrill. No extra heart sounds. PULM: CTA B, no wheezes, crackles, rhonchi. No retractions. No resp. distress. No accessory muscle use. ABD: S, NT, ND, +BS. No rebound. No HSM. EXTR: No c/c/e NEURO Normal gait.  PSYCH: Normally interactive. Conversant. Not depressed or anxious appearing.  Calm demeanor.    Assessment and Plan: Sinusitis augmentin mucinex  Signed,  Phillips OdorJeffery Gwendloyn Forsee, MD   Results for orders placed in visit on 02/15/14  POCT CBC      Result Value Ref Range   WBC 7.0  4.6 - 10.2 K/uL   Lymph, poc 3.1  0.6 - 3.4   POC LYMPH PERCENT 44.4  10 - 50 %L   MID (cbc) 0.4  0 - 0.9   POC MID % 5.3  0 - 12 %M   POC Granulocyte 3.5  2 - 6.9   Granulocyte percent 50.3  37 - 80 %G   RBC 4.82  4.04 - 5.48 M/uL    Hemoglobin 15.6  12.2 - 16.2 g/dL   HCT, POC 29.547.6  62.137.7 - 47.9 %   MCV 98.7 (*) 80 - 97 fL   MCH, POC 32.2 (*) 27 - 31.2 pg   MCHC 32.6  31.8 - 35.4 g/dL   RDW, POC 30.813.2     Platelet Count, POC 400  142 - 424 K/uL   MPV 9.2  0 - 99.8 fL  POCT URINALYSIS DIPSTICK      Result Value Ref Range   Color, UA lt yellow     Clarity, UA clear     Glucose, UA neg     Bilirubin, UA neg     Ketones, UA neg     Spec Grav, UA 1.020     Blood, UA neg     pH, UA 5.5     Protein, UA neg     Urobilinogen, UA 0.2     Nitrite, UA neg     Leukocytes, UA Negative    POCT UA - MICROSCOPIC ONLY      Result Value Ref Range   WBC, Ur, HPF, POC neg     RBC, urine, microscopic neg     Bacteria, U Microscopic neg     Mucus, UA neg     Epithelial cells, urine per micros neg     Crystals, Ur, HPF, POC neg     Casts, Ur, LPF, POC neg     Yeast, UA neg

## 2014-02-16 MED ORDER — ATORVASTATIN CALCIUM 20 MG PO TABS: 20.0000 mg | ORAL_TABLET | Freq: Every day | ORAL | Status: DC

## 2014-02-16 MED ORDER — LEVOTHYROXINE SODIUM 125 MCG PO TABS: 125.0000 ug | ORAL_TABLET | Freq: Every day | ORAL | Status: DC

## 2014-02-16 NOTE — Addendum Note (Signed)
Addended by: Carmelina DaneANDERSON, Tiannah Greenly S on: 02/16/2014 10:41 AM   Modules accepted: Orders

## 2014-05-17 ENCOUNTER — Other Ambulatory Visit: Payer: Self-pay | Admitting: Dermatology

## 2014-05-31 ENCOUNTER — Ambulatory Visit (INDEPENDENT_AMBULATORY_CARE_PROVIDER_SITE_OTHER): Payer: BC Managed Care – PPO | Admitting: Family Medicine

## 2014-05-31 VITALS — BP 120/76 | HR 85 | Temp 97.9°F | Resp 16 | Ht 61.25 in | Wt 184.4 lb

## 2014-05-31 DIAGNOSIS — E785 Hyperlipidemia, unspecified: Secondary | ICD-10-CM

## 2014-05-31 DIAGNOSIS — Q851 Tuberous sclerosis: Secondary | ICD-10-CM

## 2014-05-31 DIAGNOSIS — I10 Essential (primary) hypertension: Secondary | ICD-10-CM

## 2014-05-31 DIAGNOSIS — E039 Hypothyroidism, unspecified: Secondary | ICD-10-CM

## 2014-05-31 DIAGNOSIS — E669 Obesity, unspecified: Secondary | ICD-10-CM

## 2014-05-31 LAB — TSH: TSH: 0.079 u[IU]/mL — ABNORMAL LOW (ref 0.350–4.500)

## 2014-05-31 MED ORDER — ENALAPRIL MALEATE 20 MG PO TABS: 20.0000 mg | ORAL_TABLET | Freq: Two times a day (BID) | ORAL | Status: DC

## 2014-05-31 NOTE — Patient Instructions (Signed)
Hypothyroidism The thyroid is a large gland located in the lower front of your neck. The thyroid gland helps control metabolism. Metabolism is how your body handles food. It controls metabolism with the hormone thyroxine. When this gland is underactive (hypothyroid), it produces too little hormone.  CAUSES These include:   Absence or destruction of thyroid tissue.  Goiter due to iodine deficiency.  Goiter due to medications.  Congenital defects (since birth).  Problems with the pituitary. This causes a lack of TSH (thyroid stimulating hormone). This hormone tells the thyroid to turn out more hormone. SYMPTOMS  Lethargy (feeling as though you have no energy)  Cold intolerance  Weight gain (in spite of normal food intake)  Dry skin  Coarse hair  Menstrual irregularity (if severe, may lead to infertility)  Slowing of thought processes Cardiac problems are also caused by insufficient amounts of thyroid hormone. Hypothyroidism in the newborn is cretinism, and is an extreme form. It is important that this form be treated adequately and immediately or it will lead rapidly to retarded physical and mental development. DIAGNOSIS  To prove hypothyroidism, your caregiver may do blood tests and ultrasound tests. Sometimes the signs are hidden. It may be necessary for your caregiver to watch this illness with blood tests either before or after diagnosis and treatment. TREATMENT  Low levels of thyroid hormone are increased by using synthetic thyroid hormone. This is a safe, effective treatment. It usually takes about four weeks to gain the full effects of the medication. After you have the full effect of the medication, it will generally take another four weeks for problems to leave. Your caregiver may start you on low doses. If you have had heart problems the dose may be gradually increased. It is generally not an emergency to get rapidly to normal. HOME CARE INSTRUCTIONS   Take your  medications as your caregiver suggests. Let your caregiver know of any medications you are taking or start taking. Your caregiver will help you with dosage schedules.  As your condition improves, your dosage needs may increase. It will be necessary to have continuing blood tests as suggested by your caregiver.  Report all suspected medication side effects to your caregiver. SEEK MEDICAL CARE IF: Seek medical care if you develop:  Sweating.  Tremulousness (tremors).  Anxiety.  Rapid weight loss.  Heat intolerance.  Emotional swings.  Diarrhea.  Weakness. SEEK IMMEDIATE MEDICAL CARE IF:  You develop chest pain, an irregular heart beat (palpitations), or a rapid heart beat. MAKE SURE YOU:   Understand these instructions.  Will watch your condition.  Will get help right away if you are not doing well or get worse. Document Released: 11/02/2005 Document Revised: 01/25/2012 Document Reviewed: 06/22/2008 ExitCare Patient Information 2015 [employer]. This information is not intended to replace advice given to you by your health care provider. Make sure you discuss any questions you have with your health care provider.  

## 2014-05-31 NOTE — Progress Notes (Addendum)
Subjective:  This chart was scribed for Norberto SorensonEva Shaw, MD by Charline BillsEssence Howell, ED Scribe. The patient was seen in room 8. Patient's care was started at 2:39 PM.   Patient ID: [name], female    DOB: 1961-07-06, 53 y.o.   MRN: [id number]  Chief Complaint  Patient presents with  . Medication Refill    refill and lab work for levothroid refill   HPI HPI Comments: Deanna BatheMargaret L Tuckett is a 53 y.o. female, with a h/o hypothyroidism, who presents to the [facility] for Levothroid refill. Labs were done in April. At that time her TSH was mildly elevated at 5.4. Her levothyroxine was increased from 112 to 125. CBC was normal, UA normal, CMP normal, lipid panel was elevated LDL of 184 and non HDL of 782219. She was recommended to start Lipitor 20 mg daily.   Pt states that she likes the higher dosage of levothyroxine but she does report side effects of feeling cold "from the inside out" and fatigue. Pt was diagnosed with hypothyroidism after child birth approximately 23 years ago. She denies radiation or surgery. Pt also has a family h/o thyroid disease.   Pt states that she tried Lipitor before and did not like being on it. She states that she felt foggy and tired while on the medication.  After she stopped the medication, her symptoms resolved.  Then she retried the lipitor a second time - memory loss and trouble thinking recurred and even her husband noticed so she would really prefer to stay off the statins if possible. Pt states that she eats a lot of fruits and vegetables and plans to restart fish oil. Pt is a nonsmoker.     Past Medical History  Diagnosis Date  . Hypertension   . Thyroid disease   . Chronic kidney disease     [facility] S[facility]; [facility]  [name]. Allergy     dust mite allergies; Zyrtec.  [name]. 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  [name]. Hyperlipidemia     Current Outpatient Prescriptions on File Prior to Visit  Medication Sig Dispense Refill  . enalapril (VASOTEC) 20 MG tablet Take 1 tablet (20 mg total) by mouth 2 (two) times daily.  180 tablet  3  . levothyroxine (SYNTHROID, LEVOTHROID) 125 MCG tablet Take 1 tablet (125 mcg total) by mouth daily. PATIENT NEEDS OFFICE VISIT FOR ADDITIONAL REFILLS  30 tablet  2  . amoxicillin-clavulanate (AUGMENTIN) 875-125 MG per tablet Take 1 tablet by mouth 2 (two) times daily.  20 tablet  0  . atorvastatin (LIPITOR) 20 MG tablet Take 1 tablet (20 mg total) by mouth daily.  90 tablet  3  . guaiFENesin-dextromethorphan (ROBITUSSIN DM) 100-10 MG/5ML syrup Take 5 mLs by mouth every 4 (four) hours as needed for cough.  118 mL  0  . pseudoephedrine-guaifenesin (MUCINEX D) 60-600 MG per tablet Take 1 tablet by mouth every 12 (twelve) hours.  18 tablet  0   No current facility-administered medications on file prior to visit.   Allergies  Allergen Reactions  . Sulfa Antibiotics Hives   Review of Systems  Constitutional: Positive for chills and fatigue. Negative for diaphoresis.  Eyes: Negative for visual disturbance.  Respiratory: Negative for cough, chest tightness and shortness of breath.   Cardiovascular: Negative for chest pain, palpitations and leg swelling.  Gastrointestinal: Negative for nausea, vomiting, abdominal pain and  diarrhea.  Skin: Negative for rash.  Neurological: Negative for dizziness, weakness, light-headedness, numbness and headaches.   Triage Vitals: BP 120/76  Pulse 85  Temp(Src) 97.9 F (36.6 C) (Oral)  Resp 16  Ht 5' 1.25" (1.556 m)  Wt 184 lb 6.4 oz (83.643 kg)  BMI 34.55 kg/m2  SpO2 97%    Objective:   Physical Exam  Nursing note and vitals reviewed. Constitutional: She is oriented to person, place, and time. She appears well-developed and well-nourished.  HENT:  Head: Normocephalic and atraumatic.  Eyes: Conjunctivae and EOM are normal.  Neck: Neck supple.   Cardiovascular: Normal rate, regular rhythm and normal heart sounds.   Pulmonary/Chest: Effort normal and breath sounds normal.  Musculoskeletal: Normal range of motion.  Lymphadenopathy:    She has no cervical adenopathy.  Neurological: She is alert and oriented to person, place, and time.  Skin: Skin is warm and dry.  Psychiatric: She has a normal mood and affect. Her behavior is normal.      Assessment & Plan:   Essential hypertension - BM well controlled so cont current regiemn  Hypothyroidism, unspecified hypothyroidism type - Plan: TSH - pt wants to keep tsh at lower end - wants levothyroxine to be as high as possible. Increased from 112 to 125 at last visit - recheck and refill after lab returns.  ADDENDUM: TSH suppressed - recommend pt alt 112 and 125 qod and recheck in 3-6 mos but pt does not want to change dose as her sxs have improved, ok to cont 125 x 2 mos only and recheck again with repeat TSH before additional refills.  Obesity, unspecified  Other and unspecified hyperlipidemia - reviewed labs - last with markedly elev LDL - however due to well-controlled BP, elev HDL, and lack of other risk factors, ASCVD risk calc still only places pt at 3% so no need to start statin at this time - esp considering her intolerance. Restart fish oil and cont to work on diet/exericse. Recheck in 6-12 mos.  Tuberous sclerosis - Plan: enalapril (VASOTEC) 20 MG tablet  Meds ordered this encounter  Medications  . enalapril (VASOTEC) 20 MG tablet    Sig: Take 1 tablet (20 mg total) by mouth 2 (two) times daily.    Dispense:  180 tablet    Refill:  3    I personally performed the services described in this documentation, which was scribed in my presence. The recorded information has been reviewed and considered, and addended by me as needed.  Norberto Sorenson, MD MPH

## 2014-06-07 MED ORDER — LEVOTHYROXINE SODIUM 125 MCG PO TABS: 125.0000 ug | ORAL_TABLET | Freq: Every day | ORAL | Status: DC

## 2014-06-07 NOTE — Addendum Note (Signed)
Addended by: Norberto SorensonSHAW, EVA on: 06/07/2014 06:27 AM   Modules accepted: Orders

## 2014-08-05 ENCOUNTER — Ambulatory Visit (INDEPENDENT_AMBULATORY_CARE_PROVIDER_SITE_OTHER): Payer: BC Managed Care – PPO | Admitting: Physician Assistant

## 2014-08-05 VITALS — BP 128/83 | HR 86 | Temp 98.0°F | Resp 20 | Ht 60.0 in | Wt 186.4 lb

## 2014-08-05 DIAGNOSIS — Z23 Encounter for immunization: Secondary | ICD-10-CM

## 2014-08-05 DIAGNOSIS — E039 Hypothyroidism, unspecified: Secondary | ICD-10-CM

## 2014-08-05 MED ORDER — LEVOTHYROXINE SODIUM 125 MCG PO TABS: 125.0000 ug | ORAL_TABLET | Freq: Every day | ORAL | Status: DC

## 2014-08-05 NOTE — Patient Instructions (Addendum)
I will contact you with your lab results as soon as they are available.   If you have not heard from me in 2 weeks, please contact me.  The fastest way to get your results is to register for My Chart (see the instructions on the last page of this printout).   Influenza Vaccine (Flu Vaccine, Inactivated or Recombinant) 2014-2015: What You Need to Know 1. Why get vaccinated? Influenza ("flu") is a contagious disease that spreads around the [place] every winter, usually between October and May. Flu is caused by influenza viruses, and is spread mainly by coughing, sneezing, and close contact. Anyone can get flu, but the risk of getting flu is highest among children. Symptoms come on suddenly and may last several days. They can include:  fever/chills  sore throat  muscle aches  fatigue  cough  headache  runny or stuffy nose Flu can make some people much sicker than others. These people include young children, people 65 and older, pregnant women, and people with certain health conditions-such as heart, lung or kidney disease, nervous system disorders, or a weakened immune system. Flu vaccination is especially important for these people, and anyone in close contact with them. Flu can also lead to pneumonia, and make existing medical conditions worse. It can cause diarrhea and seizures in children. Each year thousands of people in the [place] die from flu, and many more are hospitalized. Flu vaccine is the best protection against flu and its complications. Flu vaccine also helps prevent spreading flu from person to person. 2. Inactivated and recombinant flu vaccines You are getting an injectable flu vaccine, which is either an "inactivated" or "recombinant" vaccine. These vaccines do not contain any live influenza virus. They are given by injection with a needle, and often called the "flu shot."  A different live, attenuated (weakened) influenza vaccine is sprayed into the nostrils.  This vaccine is described in a separate Vaccine Information Statement. Flu vaccination is recommended every year. Some children 6 months through 8 years of age might need two doses during one year. Flu viruses are always changing. Each year's flu vaccine is made to protect against 3 or 4 viruses that are likely to cause disease that year. Flu vaccine cannot prevent all cases of flu, but it is the best defense against the disease.  It takes about 2 weeks for protection to develop after the vaccination, and protection lasts several months to a year. Some illnesses that are not caused by influenza virus are often mistaken for flu. Flu vaccine will not prevent these illnesses. It can only prevent influenza. Some inactivated flu vaccine contains a very small amount of a mercury-based preservative called thimerosal. Studies have shown that thimerosal in vaccines is not harmful, but flu vaccines that do not contain a preservative are available. 3. Some people should not get this vaccine Tell the person who gives you the vaccine:  If you have any severe, life-threatening allergies. If you ever had a life-threatening allergic reaction after a dose of flu vaccine, or have a severe allergy to any part of this vaccine, including (for example) an allergy to gelatin, antibiotics, or eggs, you may be advised not to get vaccinated. Most, but not all, types of flu vaccine contain a small amount of egg protein.  If you ever had Guillain-Barr Syndrome (a severe paralyzing illness, also called GBS). Some people with a history of GBS should not get this vaccine. This should be discussed with your doctor.  If you   are not feeling well. It is usually okay to get flu vaccine when you have a mild illness, but you might be advised to wait until you feel better. You should come back when you are better. 4. Risks of a vaccine reaction With a vaccine, like any medicine, there is a chance of side effects. These are usually mild  and go away on their own. Problems that could happen after any vaccine:  Brief fainting spells can happen after any medical procedure, including vaccination. Sitting or lying down for about 15 minutes can help prevent fainting, and injuries caused by a fall. Tell your doctor if you feel dizzy, or have vision changes or ringing in the ears.  Severe shoulder pain and reduced range of motion in the arm where a shot was given can happen, very rarely, after a vaccination.  Severe allergic reactions from a vaccine are very rare, estimated at less than 1 in a million doses. If one were to occur, it would usually be within a few minutes to a few hours after the vaccination. Mild problems following inactivated flu vaccine:  soreness, redness, or swelling where the shot was given  hoarseness  sore, red or itchy eyes  cough  fever  aches  headache  itching  fatigue If these problems occur, they usually begin soon after the shot and last 1 or 2 days. Moderate problems following inactivated flu vaccine:  Young children who get inactivated flu vaccine and pneumococcal vaccine (PCV13) at the same time may be at increased risk for seizures caused by fever. Ask your doctor for more information. Tell your doctor if a child who is getting flu vaccine has ever had a seizure. Inactivated flu vaccine does not contain live flu virus, so you cannot get the flu from this vaccine. As with any medicine, there is a very remote chance of a vaccine causing a serious injury or death. The safety of vaccines is always being monitored. For more information, visit: www.cdc.gov/vaccinesafety/ 5. What if there is a serious reaction? What should I look for?  Look for anything that concerns you, such as signs of a severe allergic reaction, very high fever, or behavior changes. Signs of a severe allergic reaction can include hives, swelling of the face and throat, difficulty breathing, a fast heartbeat, dizziness, and  weakness. These would start a few minutes to a few hours after the vaccination. What should I do?  If you think it is a severe allergic reaction or other emergency that can't wait, call 9-1-1 and get the person to the nearest hospital. Otherwise, call your doctor.  Afterward, the reaction should be reported to the Vaccine Adverse Event Reporting System (VAERS). Your doctor should file this report, or you can do it yourself through the VAERS web site at www.vaers.hhs.gov, or by calling 1-800-822-7967. VAERS does not give medical advice. 6. The National Vaccine Injury Compensation Program The National Vaccine Injury Compensation Program (VICP) is a federal program that was created to compensate people who may have been injured by certain vaccines. Persons who believe they may have been injured by a vaccine can learn about the program and about filing a claim by calling 1-800-338-2382 or visiting the VICP website at www.hrsa.gov/vaccinecompensation. There is a time limit to file a claim for compensation. 7. How can I learn more?  Ask your health care provider.  Call your local or state health department.  Contact the Centers for Disease Control and Prevention (CDC):  Call 1-800-232-4636 (1-800-CDC-INFO) or  Visit   CDC's website at www.cdc.gov/flu CDC Vaccine Information Statement (Interim) Inactivated Influenza Vaccine (07/04/2013) Document Released: 08/27/2006 Document Revised: 03/19/2014 Document Reviewed: 10/20/2013 ExitCare Patient Information 2015 [employer]. This information is not intended to replace advice given to you by your health care provider. Make sure you discuss any questions you have with your health care provider.  

## 2014-08-05 NOTE — Progress Notes (Signed)
   Subjective:    Patient ID: [name], female    DOB: 02-07-1961, 53 y.o.   MRN: [id number]   PCP: Tally Due, MD  Chief Complaint  Patient presents with  . Medication Refill    Levothyroxine   Medications, allergies, past medical history, surgical history, family history, social history and problem list reviewed and updated.  Patient Active Problem List   Diagnosis Date Noted  . Obesity, unspecified 02/15/2014  . 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 Hypothyroid 06/10/2012  . HTN (hypertension) 06/10/2012    Prior to Admission medications   Medication Sig Start Date End Date Taking? Authorizing Provider  enalapril (VASOTEC) 20 MG tablet Take 1 tablet (20 mg total) by mouth 2 (two) times daily. 05/31/14  Yes Sherren Mocha, MD  levothyroxine (SYNTHROID, LEVOTHROID) 125 MCG tablet Take 1 tablet (125 mcg total) by mouth daily. PATIENT NEEDS OFFICE VISIT FOR ADDITIONAL REFILLS 06/07/14  Yes Sherren Mocha, MD     HPI  This 53 y.o. female presents for evaluation of hypothyroidism.  At her visit in 05/2014 the TSH was suppressed, but since she was feeling so much better on the 125 mcg dose, she was advised to continue it and come back for a recheck.  She took her last dose of levothyroxine yesterday morning.   Review of Systems     Objective:   Physical Exam  Vitals reviewed. Constitutional: She is oriented to person, place, and time. She appears well-developed and well-nourished. No distress.  Eyes: Conjunctivae are normal. No scleral icterus.  Neck: No thyromegaly present.  Cardiovascular: Normal rate, regular rhythm, normal heart sounds and intact distal pulses.   Pulmonary/Chest: Effort normal and breath sounds normal.  Lymphadenopathy:    She has no cervical adenopathy.  Neurological: She is alert and oriented to person, place, and time.  Skin: Skin is warm and dry.  Psychiatric: She has a normal mood and affect. Her behavior is normal.          Assessment &  Plan:  1. Hypothyroidism, unspecified hypothyroidism type Await lab results. Adjust dose if indicated. - TSH - levothyroxine (SYNTHROID, LEVOTHROID) 125 MCG tablet; Take 1 tablet (125 mcg total) by mouth daily.  Dispense: 30 tablet; Refill: 5  2. Need for influenza vaccination - Flu Vaccine QUAD 36+ mos IM   Fernande Bras, PA-C Physician Assistant-Certified [facility]

## 2014-08-06 LAB — TSH: TSH: 0.026 u[IU]/mL — ABNORMAL LOW (ref 0.350–4.500)

## 2014-08-06 MED ORDER — LEVOTHYROXINE SODIUM 112 MCG PO TABS: ORAL_TABLET | ORAL | Status: DC

## 2014-08-06 NOTE — Addendum Note (Signed)
Addended by: Fernande Bras on: 08/06/2014 06:58 PM   Modules accepted: Orders

## 2014-10-23 ENCOUNTER — Ambulatory Visit (INDEPENDENT_AMBULATORY_CARE_PROVIDER_SITE_OTHER): Payer: BC Managed Care – PPO | Admitting: Internal Medicine

## 2014-10-23 ENCOUNTER — Other Ambulatory Visit: Payer: Self-pay | Admitting: Family Medicine

## 2014-10-23 VITALS — BP 132/84 | HR 80 | Temp 98.3°F | Resp 16 | Ht 61.0 in | Wt 190.0 lb

## 2014-10-23 DIAGNOSIS — Z79899 Other long term (current) drug therapy: Secondary | ICD-10-CM

## 2014-10-23 DIAGNOSIS — E039 Hypothyroidism, unspecified: Secondary | ICD-10-CM

## 2014-10-23 LAB — TSH: TSH: 0.224 u[IU]/mL — ABNORMAL LOW (ref 0.350–4.500)

## 2014-10-23 MED ORDER — LEVOTHYROXINE SODIUM 112 MCG PO TABS: ORAL_TABLET | ORAL | Status: DC

## 2014-10-23 NOTE — Progress Notes (Signed)
   Subjective:    Patient ID: Deanna BatheMargaret L Totino, female    DOB: Jun 30, 1961, 53 y.o.   MRN: [id number]  HPI Recheck on thyroid, difficulty last couple of visits getting her thyroids levels normal. She will need a medication refill.  Shes on 125/112 alternate mcg    Review of Systems     Objective:   Physical Exam  Constitutional: She is oriented to person, place, and time. She appears well-developed and well-nourished. No distress.  HENT:  Head: Normocephalic.  Eyes: EOM are normal.  Neck: Normal range of motion.  Pulmonary/Chest: Effort normal.  Neurological: She is alert and oriented to person, place, and time. She exhibits normal muscle tone. Coordination normal.  Psychiatric: She has a normal mood and affect. Her behavior is normal. Judgment and thought content normal.  Vitals reviewed.         Assessment & Plan:  Hypothyroid/Med rf

## 2014-10-23 NOTE — Patient Instructions (Signed)
Hypothyroidism The thyroid is a large gland located in the lower front of your neck. The thyroid gland helps control metabolism. Metabolism is how your body handles food. It controls metabolism with the hormone thyroxine. When this gland is underactive (hypothyroid), it produces too little hormone.  CAUSES These include:   Absence or destruction of thyroid tissue.  Goiter due to iodine deficiency.  Goiter due to medications.  Congenital defects (since birth).  Problems with the pituitary. This causes a lack of TSH (thyroid stimulating hormone). This hormone tells the thyroid to turn out more hormone. SYMPTOMS  Lethargy (feeling as though you have no energy)  Cold intolerance  Weight gain (in spite of normal food intake)  Dry skin  Coarse hair  Menstrual irregularity (if severe, may lead to infertility)  Slowing of thought processes Cardiac problems are also caused by insufficient amounts of thyroid hormone. Hypothyroidism in the newborn is cretinism, and is an extreme form. It is important that this form be treated adequately and immediately or it will lead rapidly to retarded physical and mental development. DIAGNOSIS  To prove hypothyroidism, your caregiver may do blood tests and ultrasound tests. Sometimes the signs are hidden. It may be necessary for your caregiver to watch this illness with blood tests either before or after diagnosis and treatment. TREATMENT  Low levels of thyroid hormone are increased by using synthetic thyroid hormone. This is a safe, effective treatment. It usually takes about four weeks to gain the full effects of the medication. After you have the full effect of the medication, it will generally take another four weeks for problems to leave. Your caregiver may start you on low doses. If you have had heart problems the dose may be gradually increased. It is generally not an emergency to get rapidly to normal. HOME CARE INSTRUCTIONS   Take your  medications as your caregiver suggests. Let your caregiver know of any medications you are taking or start taking. Your caregiver will help you with dosage schedules.  As your condition improves, your dosage needs may increase. It will be necessary to have continuing blood tests as suggested by your caregiver.  Report all suspected medication side effects to your caregiver. SEEK MEDICAL CARE IF: Seek medical care if you develop:  Sweating.  Tremulousness (tremors).  Anxiety.  Rapid weight loss.  Heat intolerance.  Emotional swings.  Diarrhea.  Weakness. SEEK IMMEDIATE MEDICAL CARE IF:  You develop chest pain, an irregular heart beat (palpitations), or a rapid heart beat. MAKE SURE YOU:   Understand these instructions.  Will watch your condition.  Will get help right away if you are not doing well or get worse. Document Released: 11/02/2005 Document Revised: 01/25/2012 Document Reviewed: 06/22/2008 ExitCare Patient Information 2015 [employer]. This information is not intended to replace advice given to you by your health care provider. Make sure you discuss any questions you have with your health care provider.  

## 2014-10-25 MED ORDER — LEVOTHYROXINE SODIUM 112 MCG PO TABS: ORAL_TABLET | ORAL | Status: DC

## 2014-10-25 NOTE — Addendum Note (Signed)
Addended by: Norberto SorensonSHAW, EVA on: 10/25/2014 03:33 PM   Modules accepted: Orders

## 2014-12-13 ENCOUNTER — Other Ambulatory Visit (INDEPENDENT_AMBULATORY_CARE_PROVIDER_SITE_OTHER): Payer: BC Managed Care – PPO | Admitting: Family Medicine

## 2014-12-13 DIAGNOSIS — Z79899 Other long term (current) drug therapy: Secondary | ICD-10-CM

## 2014-12-13 DIAGNOSIS — E039 Hypothyroidism, unspecified: Secondary | ICD-10-CM

## 2014-12-13 LAB — THYROID PANEL WITH TSH
Free Thyroxine Index: 3.4 (ref 1.4–3.8)
T3 Uptake: 33 % (ref 22–35)
T4, Total: 10.2 ug/dL (ref 4.5–12.0)
TSH: 0.042 u[IU]/mL — ABNORMAL LOW (ref 0.350–4.500)

## 2014-12-18 ENCOUNTER — Other Ambulatory Visit: Payer: Self-pay | Admitting: Obstetrics and Gynecology

## 2014-12-20 LAB — CYTOLOGY - PAP

## 2014-12-27 ENCOUNTER — Other Ambulatory Visit: Payer: Self-pay | Admitting: Family Medicine

## 2015-01-30 ENCOUNTER — Other Ambulatory Visit: Payer: Self-pay | Admitting: Family Medicine

## 2015-01-30 DIAGNOSIS — Z79899 Other long term (current) drug therapy: Secondary | ICD-10-CM

## 2015-01-30 DIAGNOSIS — E039 Hypothyroidism, unspecified: Secondary | ICD-10-CM

## 2015-01-30 MED ORDER — LEVOTHYROXINE SODIUM 112 MCG PO TABS: 112.0000 ug | ORAL_TABLET | Freq: Every day | ORAL | Status: DC

## 2015-02-14 ENCOUNTER — Ambulatory Visit (INDEPENDENT_AMBULATORY_CARE_PROVIDER_SITE_OTHER): Payer: BC Managed Care – PPO | Admitting: Family Medicine

## 2015-02-14 VITALS — BP 124/76 | HR 84 | Temp 98.2°F | Resp 17 | Ht 60.5 in | Wt 182.0 lb

## 2015-02-14 DIAGNOSIS — Z79899 Other long term (current) drug therapy: Secondary | ICD-10-CM

## 2015-02-14 DIAGNOSIS — E039 Hypothyroidism, unspecified: Secondary | ICD-10-CM

## 2015-02-14 DIAGNOSIS — I10 Essential (primary) hypertension: Secondary | ICD-10-CM

## 2015-02-14 DIAGNOSIS — Q851 Tuberous sclerosis: Secondary | ICD-10-CM

## 2015-02-14 LAB — BASIC METABOLIC PANEL
BUN: 17 mg/dL (ref 6–23)
CO2: 27 mEq/L (ref 19–32)
Calcium: 9.9 mg/dL (ref 8.4–10.5)
Chloride: 105 mEq/L (ref 96–112)
Creat: 0.82 mg/dL (ref 0.50–1.10)
Glucose, Bld: 94 mg/dL (ref 70–99)
Potassium: 4.5 mEq/L (ref 3.5–5.3)
SODIUM: 140 meq/L (ref 135–145)

## 2015-02-14 LAB — THYROID PANEL WITH TSH
Free Thyroxine Index: 3.1 (ref 1.4–3.8)
T3 Uptake: 31 % (ref 22–35)
T4, Total: 10.1 ug/dL (ref 4.5–12.0)
TSH: 0.153 u[IU]/mL — ABNORMAL LOW (ref 0.350–4.500)

## 2015-02-14 MED ORDER — ENALAPRIL MALEATE 20 MG PO TABS: 40.0000 mg | ORAL_TABLET | Freq: Every day | ORAL | Status: DC

## 2015-02-14 MED ORDER — LEVOTHYROXINE SODIUM 112 MCG PO TABS: 112.0000 ug | ORAL_TABLET | Freq: Every day | ORAL | Status: DC

## 2015-02-18 MED ORDER — LEVOTHYROXINE SODIUM 100 MCG PO TABS: 100.0000 ug | ORAL_TABLET | Freq: Every day | ORAL | Status: DC

## 2015-02-26 NOTE — Progress Notes (Signed)
This chart was scribed for Norberto SorensonEva Shaw, MD by Luisa DagoPriscilla Tutu, Medical Scribe. This patient was seen in room 9 and the patient's care was started at 10:37 AM.  Subjective:    Patient ID: [name], female    DOB: 04/15/1961, 54 y.o.   MRN: [id number]  Chief Complaint  Patient presents with  . Medication Refill    synthroid    HPI Deanna BatheMargaret L Stevens is a 54 y.o. female whose levothyroxine medication has been changing quite a bit. One year ago her TSH was mildly elevated at 5.5 but since that time at 8 months and 3 months TSH has been too surpressed. It appears that pt was initially taking levothyroxine 112 mg a year ago. So dose was increased to 125 mg when TSH was mildly elevated at 5.5 as pt was complaining of fatigue at that time. Since that time with her TSH's being surpressed she was switched to 112 mg levothyroxine. She has been hypothyroid since child birth 25 years ago. Pt feels better when her TSH level is at the lower end of the spectrum and like to be on a higher medication dose and so has been alternating between 112 mg and 125 mg levothyroxine. Pt last lipid panel one year previously was quite elevated with LDL of 185 and non-HDL of 956219. Given an ASCVD risk of 3.2% and pt was recommended to start on Lipitor but had fatigue and mental slowing on it.   Today, pt states that she is no longer alternating between the 112 mg and 125 mg of levothyroxine. Last dose of alternating dosages was over 3 months ago. Now she is taking the prescribed levothyroxine 112 mg daily. Currently, she is out of her medication and would like a refill on the 112 mg of levothyroxine.  Pt states that she has been trying to manage her stress by exercising and watching her diet. She states that she has been intermittently checking her BP at home with readings of 120-130/ 80. Pt reports taking her levothyroxine at dinner. She denies any adverse side effects. Pt deferred blood work to a later office visit.   Pt  denies any  fever, neck pain, sore throat, visual disturbance, CP, cough, SOB, abdominal pain, nausea, emesis, diarrhea, urinary symptoms, back pain, HA, weakness, numbness and rash as associated symptoms.     Patient Active Problem List   Diagnosis Date Noted  . Obesity, unspecified 02/15/2014  . 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 Hypothyroid 06/10/2012  . HTN (hypertension) 06/10/2012   Past Medical History  Diagnosis Date  . Hypertension   . Thyroid disease   . Chronic kidney disease     [facility]  [name]. Allergy     dust mite allergies; Zyrtec.  [name]. 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  [name]. Hyperlipidemia    Past Surgical History  Procedure Laterality Date  . Tubal ligation    . Cesarean section    . Abdominal hysterectomy      DUB; ovaries remaining.  [name]. Spine surgery      L1 fracture with spinal cord injury with horseback riding.  . Sleep study  12/17/2010    No sleep disordered breathing or periodic limb mvmets.  Excess light sleep.   Allergies  Allergen Reactions  . Sulfa Antibiotics Hives   Prior to Admission medications   Medication Sig Start Date End Date Taking? Authorizing  Provider  enalapril (VASOTEC) 20 MG tablet Take 1 tablet (20 mg total) by mouth 2 (two) times daily. 05/31/14  Yes Sherren Mocha, MD  levothyroxine (SYNTHROID, LEVOTHROID) 112 MCG tablet Take 1 tablet (112 mcg total) by mouth daily before breakfast. 01/30/15  Yes Sherren Mocha, MD     Review of Systems  Constitutional: Positive for chills and fatigue. Negative for fever.  HENT: Negative for congestion and sore throat.   Eyes: Negative for visual disturbance.  Respiratory: Negative for cough and shortness of breath.   Cardiovascular: Negative for chest pain and leg swelling.  Gastrointestinal: Negative for nausea, vomiting, abdominal pain and diarrhea.  Genitourinary: Negative for  dysuria.  Musculoskeletal: Negative for back pain and neck pain.  Skin: Negative for rash.  Neurological: Negative for headaches.     BP 124/76 mmHg  Pulse 84  Temp(Src) 98.2 F (36.8 C) (Oral)  Resp 17  Ht 5' 0.5" (1.537 m)  Wt 182 lb (82.555 kg)  BMI 34.95 kg/m2  SpO2 97%  Objective:   Physical Exam  Constitutional: She appears well-developed and well-nourished. No distress.  HENT:  Head: Normocephalic and atraumatic.  Eyes: Conjunctivae are normal. Right eye exhibits no discharge. Left eye exhibits no discharge.  Neck: Normal range of motion. Neck supple. No thyromegaly present.  Cardiovascular: Normal rate, regular rhythm, S1 normal, S2 normal and normal heart sounds.  Exam reveals no gallop and no friction rub.   No murmur heard. Pulmonary/Chest: Effort normal and breath sounds normal. No respiratory distress. She has no wheezes. She has no rales.  Abdominal: Soft. She exhibits no distension. There is no tenderness.  Musculoskeletal: She exhibits no edema or tenderness.  Lymphadenopathy:    She has cervical adenopathy (mild).  Neurological: She is alert.  Skin: Skin is warm and dry.  Psychiatric: She has a normal mood and affect. Her behavior is normal. Thought content normal.  Nursing note and vitals reviewed.  Assessment & Plan:   10:46 AM--Past TSH levels discussed and interpreted for the pt.  Hypothyroidism, unspecified hypothyroidism type - Plan: Thyroid Panel With TSH, levothyroxine (SYNTHROID, LEVOTHROID) 100 MCG tablet, DISCONTINUED: levothyroxine (SYNTHROID, LEVOTHROID) 112 MCG tablet - decrease form 112 mcg qd to 100 since TSH is still suppressed as it has been on the last 3 labs. Recheck in 2-3 months.  Encounter for medication review - Plan: Basic metabolic panel, levothyroxine (SYNTHROID, LEVOTHROID) 100 MCG tablet, DISCONTINUED: levothyroxine (SYNTHROID, LEVOTHROID) 112 MCG tablet  Tuberous sclerosis - Plan: enalapril (VASOTEC) 20 MG tablet  Essential  hypertension - Plan: Basic metabolic panel  Meds ordered this encounter  Medications  . DISCONTD: levothyroxine (SYNTHROID, LEVOTHROID) 112 MCG tablet    Sig: Take 1 tablet (112 mcg total) by mouth daily before breakfast.    Dispense:  90 tablet    Refill:  0  . enalapril (VASOTEC) 20 MG tablet    Sig: Take 2 tablets (40 mg total) by mouth daily.    Dispense:  180 tablet    Refill:  3  . levothyroxine (SYNTHROID, LEVOTHROID) 100 MCG tablet    Sig: Take 1 tablet (100 mcg total) by mouth daily before breakfast.    Dispense:  90 tablet    Refill:  0    I personally performed the services described in this documentation, which was scribed in my presence. The recorded information has been reviewed and considered, and addended by me as needed.  Norberto Sorenson, MD MPH   Results for orders placed or performed in  visit on 02/14/15  Basic metabolic panel  Result Value Ref Range   Sodium 140 135 - 145 mEq/L   Potassium 4.5 3.5 - 5.3 mEq/L   Chloride 105 96 - 112 mEq/L   CO2 27 19 - 32 mEq/L   Glucose, Bld 94 70 - 99 mg/dL   BUN 17 6 - 23 mg/dL   Creat 1.61 0.96 - 0.45 mg/dL   Calcium 9.9 8.4 - 40.9 mg/dL  Thyroid Panel With TSH  Result Value Ref Range   T4, Total 10.1 4.5 - 12.0 ug/dL   T3 Uptake 31 22 - 35 %   Free Thyroxine Index 3.1 1.4 - 3.8   TSH 0.153 (L) 0.350 - 4.500 uIU/mL

## 2015-05-17 ENCOUNTER — Telehealth: Payer: Self-pay

## 2015-05-17 DIAGNOSIS — Z79899 Other long term (current) drug therapy: Secondary | ICD-10-CM

## 2015-05-17 DIAGNOSIS — E039 Hypothyroidism, unspecified: Secondary | ICD-10-CM

## 2015-05-17 MED ORDER — LEVOTHYROXINE SODIUM 100 MCG PO TABS: 100.0000 ug | ORAL_TABLET | Freq: Every day | ORAL | Status: DC

## 2015-05-17 NOTE — Telephone Encounter (Signed)
Pt called wanting a refill on her levothyroxine (SYNTHROID, LEVOTHROID) 100 MCG tablet [[id number]][[id number]] .Please advise at [phone]

## 2015-05-17 NOTE — Telephone Encounter (Signed)
Rx sent.  Pt notified. 

## 2015-05-31 ENCOUNTER — Encounter: Payer: Self-pay | Admitting: Family Medicine

## 2015-05-31 ENCOUNTER — Ambulatory Visit (INDEPENDENT_AMBULATORY_CARE_PROVIDER_SITE_OTHER): Payer: BC Managed Care – PPO | Admitting: Family Medicine

## 2015-05-31 VITALS — BP 142/84 | HR 84 | Temp 98.4°F | Resp 16 | Wt 181.6 lb

## 2015-05-31 DIAGNOSIS — I10 Essential (primary) hypertension: Secondary | ICD-10-CM

## 2015-05-31 DIAGNOSIS — Z79899 Other long term (current) drug therapy: Secondary | ICD-10-CM | POA: Diagnosis not present

## 2015-05-31 DIAGNOSIS — E669 Obesity, unspecified: Secondary | ICD-10-CM | POA: Diagnosis not present

## 2015-05-31 DIAGNOSIS — E039 Hypothyroidism, unspecified: Secondary | ICD-10-CM

## 2015-05-31 DIAGNOSIS — E785 Hyperlipidemia, unspecified: Secondary | ICD-10-CM | POA: Diagnosis not present

## 2015-05-31 LAB — LIPID PANEL
Cholesterol: 259 mg/dL — ABNORMAL HIGH (ref 0–200)
HDL: 54 mg/dL (ref >=46)
LDL Cholesterol: 175 mg/dL — ABNORMAL HIGH (ref 0–99)
TRIGLYCERIDES: 150 mg/dL — AB (ref <150)
Total CHOL/HDL Ratio: 4.8 Ratio
VLDL: 30 mg/dL (ref 0–40)

## 2015-05-31 LAB — TSH: TSH: 1.776 u[IU]/mL (ref 0.350–4.500)

## 2015-05-31 MED ORDER — ENALAPRIL-HYDROCHLOROTHIAZIDE 10-25 MG PO TABS: 1.0000 | ORAL_TABLET | Freq: Every day | ORAL | Status: DC

## 2015-05-31 NOTE — Progress Notes (Signed)
Subjective:  This chart was scribed for Norberto SorensonEva Shawntelle Ungar, MD by Andrew Auaven Small, ED Scribe. This patient was seen in room 21 and the patient's care was started at 9:54 AM.  Patient ID: [name], female    DOB: [birth year], 54 y.o.   MRN: [id number]  Chief Complaint  Patient presents with  . Hypothyroidism    HPI  HPI Comments: Deanna BatheMargaret L Stevens is a 54 y.o. female who presents to the [facility] for a follow up.  Hypothyroidism I saw pt 3 mos prior for her hypothyroid. At last visit pt's levothyroxine was reduced to 100mcg daily as her tsh and continued to be suppressed when she was on 112 mcg of levothyroxine for the 3 mos prior as tsh was still suppressed when she was alternating 112 and 125 mcg. Was taking levothyroxine at dinnertime.   Lab Results  Component Value Date   TSH 0.153* 02/14/2015   Pt has not noticed changes in her mood, hair and skin since change in dose. Pt exercises regular by walking and deep water jogging at the pool about 5 times a week. She also tries to cut back on sugar. She is considering weight training with her son. States prior back injury/surgery to L1 several years ago have interfered with activity. She is unable to recall who she was followed by. Her entire family is over weight.   Wt Readings from Last 3 Encounters:  05/31/15 181 lb 9.6 oz (82.373 kg)  02/14/15 182 lb (82.555 kg)  10/23/14 190 lb (86.183 kg)   Hyperlipidemia Pt last lipid panel over one year previously was quite elevated with LDL of 185 and non-HDL of 034219. Given an ASCVD risk of 3.2% and pt was recommended to start on Lipitor 20 qd but had fatigue and mental slowing on it. (pt tried med sev times w/ same sxs recurring each time so put on intolerance list. Trying tlc and fish oil. Pt is fasting.  Lab Results  Component Value Date   CHOL 274* 02/15/2014   HDL 55 02/15/2014   LDLCALC 185* 02/15/2014   TRIG 168* 02/15/2014   CHOLHDL 5.0 02/15/2014   Blood  pressure Checks BP outside office occasionally and states sometime its 120 and at time 140. Nml bmp 3 mos ago. Pt ran out of her enalapril but called in and received 90 day refill. She has been taking 2 tablets daily. She denies bladder incontinence.   Pt is seen at [facility] for well-woman care.  Past Medical History  Diagnosis Date  . Hypertension   . Thyroid disease   . Chronic kidney disease     [facility]  [name]. Allergy     dust mite allergies; Zyrtec.  [name]. 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  [name]. Hyperlipidemia    Past Surgical History  Procedure Laterality Date  . Tubal ligation    . Cesarean section    . Abdominal hysterectomy      DUB; ovaries remaining.  [name]. Spine surgery      L1 fracture with spinal cord injury with horseback riding.  . Sleep study  12/17/2010    No sleep disordered breathing or periodic limb mvmets.  Excess light sleep.   Prior to Admission medications   Medication Sig Start Date End Date Taking? Authorizing Provider  enalapril (VASOTEC) 20 MG tablet Take 2 tablets (40 mg  total) by mouth daily. 02/14/15   Sherren Mocha, MD  levothyroxine (SYNTHROID, LEVOTHROID) 100 MCG tablet Take 1 tablet (100 mcg total) by mouth daily before breakfast. 05/17/15   Sherren Mocha, MD   Depression screen [facility] 2/9 05/31/2015  Decreased Interest 0  Down, Depressed, Hopeless 0  PHQ - 2 Score 0     Review of Systems  Constitutional: Positive for fatigue. Negative for fever, chills, diaphoresis, activity change, appetite change and unexpected weight change.  Eyes: Negative for visual disturbance.  Respiratory: Negative for cough and shortness of breath.   Cardiovascular: Negative for chest pain, palpitations and leg swelling.  Genitourinary: Negative for dysuria, frequency, decreased urine volume and difficulty urinating.  Neurological: Negative for  syncope and headaches.  Hematological: Does not bruise/bleed easily.  Psychiatric/Behavioral: Positive for sleep disturbance. Negative for decreased concentration. The patient is not nervous/anxious.     Objective:   Physical Exam  Constitutional: She is oriented to person, place, and time. She appears well-developed and well-nourished. No distress.  HENT:  Head: Normocephalic and atraumatic.  Eyes: Conjunctivae and EOM are normal.  Neck: Neck supple.  Cardiovascular: Normal rate, regular rhythm and normal heart sounds.  Exam reveals no gallop and no friction rub.   No murmur heard. BP-126/86  Pulmonary/Chest: Effort normal and breath sounds normal. No respiratory distress. She has no wheezes. She has no rales. She exhibits no tenderness.  Musculoskeletal: Normal range of motion.  Neurological: She is alert and oriented to person, place, and time.  Skin: Skin is warm and dry.  Psychiatric: She has a normal mood and affect. Her behavior is normal.  Nursing note and vitals reviewed.   Filed Vitals:   05/31/15 1000  BP: 142/84  Pulse: 84  Temp: 98.4 F (36.9 C)  TempSrc: Oral  Resp: 16  Weight: 181 lb 9.6 oz (82.373 kg)   Assessment & Plan:   1. Hypothyroidism, unspecified hypothyroidism type - tsh nml, refilled levothyroxine 100 - recheck in 6 mos as has been difficult to get dose correct.  2. Essential hypertension - on enalapril  qd for yrs with adequate control but will try changing to enalapril-hctz - highest dose is 10-25 for hopefully better control w/ less side effects.  Check BP outside of office and call if elev  3. Obesity - pt very frustrated by continued diet/exercise w/o result - keeds yo-yo-ing. Going to start weight training with her son which will be knew.  4. Hyperlipidemia - cont on tlc - start omega-3 supp  5. Encounter for medication review     Orders Placed This Encounter  Procedures  . TSH  . Lipid panel    Order Specific Question:  Has the patient  fasted?    Answer:  Yes    Meds ordered this encounter  Medications  . cetirizine (ZYRTEC) 10 MG tablet    Sig: Take 10 mg by mouth daily.  . enalapril-hydrochlorothiazide (VASERETIC) 10-25 MG per tablet    Sig: Take 1 tablet by mouth daily.    Dispense:  90 tablet    Refill:  1  . levothyroxine (SYNTHROID, LEVOTHROID) 100 MCG tablet    Sig: Take 1 tablet (100 mcg total) by mouth daily before breakfast.    Dispense:  90 tablet    Refill:  1    I personally performed the services described in this documentation, which was scribed in my presence. The recorded information has been reviewed and considered, and addended by me as needed.  Carley Hammed  Brigitte Pulse, MD MPH

## 2015-05-31 NOTE — Progress Notes (Deleted)
I saw pt 3 mos prior for her hypothyroid. At last visit pt's levothyroxine was reduced to 100mcg daily as her tsh and continued to be suppressed when she was on 112 mcg of levothyroxine for the 3 mos prior as tsh was still suppressed when she was alternating 112 and 125 mcg. Was taking levothyroxine at dinnertime.    Pt last lipid panel over one year previously was quite elevated with LDL of 185 and non-HDL of 161219. Given an ASCVD risk of 3.2% and pt was recommended to start on Lipitor 20 qd but had fatigue and mental slowing on it. (pt tried med sev times w/ same sxs recurring each time so put on intolerance list.  Trying tlc and fish oil  Checks BP outside office.  Nml bmp 3 mos ago.  Seen at [facility] for well-woman care.

## 2015-06-01 MED ORDER — LEVOTHYROXINE SODIUM 100 MCG PO TABS: 100.0000 ug | ORAL_TABLET | Freq: Every day | ORAL | Status: DC

## 2016-02-17 ENCOUNTER — Other Ambulatory Visit: Payer: Self-pay | Admitting: Family Medicine

## 2016-03-03 ENCOUNTER — Other Ambulatory Visit: Payer: Self-pay | Admitting: Family Medicine

## 2016-04-02 ENCOUNTER — Ambulatory Visit (INDEPENDENT_AMBULATORY_CARE_PROVIDER_SITE_OTHER): Payer: BC Managed Care – PPO | Admitting: Family Medicine

## 2016-04-02 VITALS — BP 132/74 | HR 79 | Temp 98.4°F | Resp 16 | Ht 61.0 in | Wt 190.0 lb

## 2016-04-02 DIAGNOSIS — Z6835 Body mass index (BMI) 35.0-35.9, adult: Secondary | ICD-10-CM | POA: Diagnosis not present

## 2016-04-02 DIAGNOSIS — I10 Essential (primary) hypertension: Secondary | ICD-10-CM

## 2016-04-02 DIAGNOSIS — E039 Hypothyroidism, unspecified: Secondary | ICD-10-CM | POA: Diagnosis not present

## 2016-04-02 MED ORDER — LEVOTHYROXINE SODIUM 100 MCG PO TABS: 100.0000 ug | ORAL_TABLET | Freq: Every day | ORAL | Status: DC

## 2016-04-02 NOTE — Patient Instructions (Addendum)
Please return before you are out of your 90 day supply of meds- you will need a lab only visit  Schedule complete physical with Dr. Clelia CroftShaw in 6 months  HIIT- high intensity training Whole 30 diet     Why follow it? Research shows. . Those who follow the Mediterranean diet have a reduced risk of heart disease  . The diet is associated with a reduced incidence of Parkinson's and Alzheimer's diseases . People following the diet may have longer life expectancies and lower rates of chronic diseases  . The Dietary Guidelines for Americans recommends the Mediterranean diet as an eating plan to promote health and prevent disease  What Is the Mediterranean Diet?  . Healthy eating plan based on typical foods and recipes of Mediterranean-style cooking . The diet is primarily a plant based diet; these foods should make up a majority of meals   Starches - Plant based foods should make up a majority of meals - They are an important sources of vitamins, minerals, energy, antioxidants, and fiber - Choose whole grains, foods high in fiber and minimally processed items  - Typical grain sources include wheat, oats, barley, corn, brown rice, bulgar, farro, millet, polenta, couscous  - Various types of beans include chickpeas, lentils, fava beans, black beans, white beans   Fruits  Veggies - Large quantities of antioxidant rich fruits & veggies; 6 or more servings  - Vegetables can be eaten raw or lightly drizzled with oil and cooked  - Vegetables common to the traditional Mediterranean Diet include: artichokes, arugula, beets, broccoli, brussel sprouts, cabbage, carrots, celery, collard greens, cucumbers, eggplant, kale, leeks, lemons, lettuce, mushrooms, okra, onions, peas, peppers, potatoes, pumpkin, radishes, rutabaga, shallots, spinach, sweet potatoes, turnips, zucchini - Fruits common to the Mediterranean Diet include: apples, apricots, avocados, cherries, clementines, dates, figs, grapefruits, grapes,  melons, nectarines, oranges, peaches, pears, pomegranates, strawberries, tangerines  Fats - Replace butter and margarine with healthy oils, such as olive oil, canola oil, and tahini  - Limit nuts to no more than a handful a day  - Nuts include walnuts, almonds, pecans, pistachios, pine nuts  - Limit or avoid candied, honey roasted or heavily salted nuts - Olives are central to the [employer]iterranean diet - can be eaten whole or used in a variety of dishes   Meats Protein - Limiting red meat: no more than a few times a month - When eating red meat: choose lean cuts and keep the portion to the size of deck of cards - Eggs: approx. 0 to 4 times a week  - Fish and lean poultry: at least 2 a week  - Healthy protein sources include, chicken, [place], lean beef, lamb - Increase intake of seafood such as tuna, salmon, trout, mackerel, shrimp, scallops - Avoid or limit high fat processed meats such as sausage and bacon  Dairy - Include moderate amounts of low fat dairy products  - Focus on healthy dairy such as fat free yogurt, skim milk, low or reduced fat cheese - Limit dairy products higher in fat such as whole or 2% milk, cheese, ice cream  Alcohol - Moderate amounts of red wine is ok  - No more than 5 oz daily for women (all ages) and men older than age 665  - No more than 10 oz of wine daily for men younger than 1765  Other - Limit sweets and other desserts  - Use herbs and spices instead of salt to flavor foods  - Herbs and spices common  to the traditional Mediterranean Diet include: basil, bay leaves, chives, cloves, cumin, fennel, garlic, lavender, marjoram, mint, oregano, parsley, pepper, rosemary, sage, savory, sumac, tarragon, thyme   It's not just a diet, it's a lifestyle:  . The Mediterranean diet includes lifestyle factors typical of those in the region  . Foods, drinks and meals are best eaten with others and savored . Daily physical activity is important for overall good health . This could  be strenuous exercise like running and aerobics . This could also be more leisurely activities such as walking, housework, yard-work, or taking the stairs . Moderation is the key; a balanced and healthy diet accommodates most foods and drinks . Consider portion sizes and frequency of consumption of certain foods   Meal Ideas & Options:  . Breakfast:  o Whole wheat toast or whole wheat English muffins with peanut butter & hard boiled egg o Steel cut oats topped with apples & cinnamon and skim milk  o Fresh fruit: banana, strawberries, melon, berries, peaches  o Smoothies: strawberries, bananas, greek yogurt, peanut butter o Low fat greek yogurt with blueberries and granola  o Egg white omelet with spinach and mushrooms o Breakfast couscous: whole wheat couscous, apricots, skim milk, cranberries  . Sandwiches:  o Hummus and grilled vegetables (peppers, zucchini, squash) on whole wheat bread   o Grilled chicken on whole wheat pita with lettuce, tomatoes, cucumbers or tzatziki  o Tuna salad on whole wheat bread: tuna salad made with greek yogurt, olives, red peppers, capers, green onions o Garlic rosemary lamb pita: lamb sauted with garlic, rosemary, salt & pepper; add lettuce, cucumber, greek yogurt to pita - flavor with lemon juice and black pepper  . Seafood:  o Mediterranean grilled salmon, seasoned with garlic, basil, parsley, lemon juice and black pepper o Shrimp, lemon, and spinach whole-grain pasta salad made with low fat greek yogurt  o Seared scallops with lemon orzo  o Seared tuna steaks seasoned salt, pepper, coriander topped with tomato mixture of olives, tomatoes, olive oil, minced garlic, parsley, green onions and cappers  . Meats:  o Herbed greek chicken salad with kalamata olives, cucumber, feta  o Red bell peppers stuffed with spinach, bulgur, lean ground beef (or lentils) & topped with feta   o Kebabs: skewers of chicken, tomatoes, onions, zucchini, squash  o [place]  burgers: made with red onions, mint, dill, lemon juice, feta cheese topped with roasted red peppers . Vegetarian o Cucumber salad: cucumbers, artichoke hearts, celery, red onion, feta cheese, tossed in olive oil & lemon juice  o Hummus and whole grain pita points with a greek salad (lettuce, tomato, feta, olives, cucumbers, red onion) o Lentil soup with celery, carrots made with vegetable broth, garlic, salt and pepper  o Tabouli salad: parsley, bulgur, mint, scallions, cucumbers, tomato, radishes, lemon juice, olive oil, salt and pepper.         IF you received an x-ray today, you will receive an invoice from [facility]. Please contact [facility] at [phone] with questions or concerns regarding your invoice.   IF you received labwork today, you will receive an invoice from [employer]. Please contact [facility] at [phone] with questions or concerns regarding your invoice.   Our billing staff will not be able to assist you with questions regarding bills from these companies.  You will be contacted with the lab results as soon as they are available. The fastest way to get your results is to activate your My Chart account.  Instructions are located on the last page of this paperwork. If you have not heard from [place] regarding the results in 2 weeks, please contact this office.

## 2016-04-02 NOTE — Progress Notes (Signed)
Subjective:    Patient ID: Velvet BatheMargaret L Stevens, female    DOB: 02/27/1961, 55 y.o.   MRN: [id number]  HPI This is 55 yo female who presents today for follow up of hypothyroidism, HTN. She has not been on her regular dose of synthroid and has been taking some old 112 mcg. She would like to recheck her labs on her regular dose of 100 mcg. 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 Her father passed away less than 2 weeks ago from complications from CHF.    Past Medical History  Diagnosis Date  . Hypertension   . Thyroid disease   . Chronic kidney disease     [facility] S[facility]  [name]. Allergy     dust mite allergies; Zyrtec.  [name]. 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  [name]. Hyperlipidemia    Past Surgical History  Procedure Laterality Date  . Tubal ligation    . Cesarean section    . Abdominal hysterectomy      DUB; ovaries remaining.  [name]. Spine surgery      L1 fracture with spinal cord injury with horseback riding.  . Sleep study  12/17/2010    No sleep disordered breathing or periodic limb mvmets.  Excess light sleep.   Family History  Problem Relation Age of Onset  . Thyroid disease Mother   . Arthritis Mother   . Asthma Mother   . Migraines Mother   . Heart disease Father 9080    AMI age 56; CABG at 6085.  . Arthritis Father    Social History  Substance Use Topics  . Smoking status: Never Smoker   . Smokeless tobacco: Never Used  . Alcohol Use: Yes     Comment: occ      Review of Systems No chest pain, no SOB, no edema    Objective:   Physical Exam Physical Exam  Constitutional: Oriented to person, place, and time. She appears well-developed and well-nourished.  HENT:  Head: Normocephalic and atraumatic.  Eyes: Conjunctivae are normal.  Neck: Normal range of motion. Neck supple.  Cardiovascular: Normal  rate, regular rhythm and normal heart sounds.   Pulmonary/Chest: Effort normal and breath sounds normal.  Musculoskeletal: Normal range of motion.  Neurological: Alert and oriented to person, place, and time.  Skin: Skin is warm and dry.  Psychiatric: Normal mood and affect. Behavior is normal. Judgment and thought content normal.  Vitals reviewed.     BP 132/74 mmHg  Pulse 79  Temp(Src) 98.4 F (36.9 C)  Resp 16  Ht 5\' 1"  (1.549 m)  Wt 190 lb (86.183 kg)  BMI 35.92 kg/m2  SpO2 97% Wt Readings from Last 3 Encounters:  04/02/16 190 lb (86.183 kg)  05/31/15 181 lb 9.6 oz (82.373 kg)  02/14/15 182 lb (82.555 kg)       Assessment & Plan:  1. Essential hypertension - CBC; Future - Lipid panel; Future - COMPLETE METABOLIC PANEL WITH GFR; Future  2. Hypothyroidism, unspecified hypothyroidism type - levothyroxine (SYNTHROID, LEVOTHROID) 100 MCG tablet; Take 1 tablet (100 mcg total) by mouth daily before breakfast.  Dispense: 90 tablet; Refill: 0 - COMPLETE METABOLIC PANEL WITH GFR; Future  3. BMI 35.0-35.9,adult - discussed diet and exercise- Med Diet, HIIT  - follow up with Dr. Clelia CroftShaw in 6 months for CPE  Olean Ree, FNP-BC  [facility]  04/02/2016 9:42 AM

## 2016-05-16 ENCOUNTER — Other Ambulatory Visit: Payer: Self-pay | Admitting: Family Medicine

## 2016-06-29 ENCOUNTER — Ambulatory Visit: Payer: BC Managed Care – PPO | Admitting: Physician Assistant

## 2016-06-29 DIAGNOSIS — I10 Essential (primary) hypertension: Secondary | ICD-10-CM

## 2016-06-29 DIAGNOSIS — E039 Hypothyroidism, unspecified: Secondary | ICD-10-CM

## 2016-06-29 LAB — COMPLETE METABOLIC PANEL WITH GFR
ALT: 25 U/L (ref 6–29)
AST: 18 U/L (ref 10–35)
Albumin: 4.3 g/dL (ref 3.6–5.1)
Alkaline Phosphatase: 64 U/L (ref 33–130)
BUN: 18 mg/dL (ref 7–25)
CALCIUM: 9.8 mg/dL (ref 8.6–10.4)
CHLORIDE: 104 mmol/L (ref 98–110)
CO2: 29 mmol/L (ref 20–31)
Creat: 0.86 mg/dL (ref 0.50–1.05)
GFR, EST NON AFRICAN AMERICAN: 76 mL/min (ref >=60)
GFR, Est African American: 87 mL/min (ref >=60)
GLUCOSE: 99 mg/dL (ref 65–99)
POTASSIUM: 4.6 mmol/L (ref 3.5–5.3)
SODIUM: 139 mmol/L (ref 135–146)
Total Bilirubin: 0.6 mg/dL (ref 0.2–1.2)
Total Protein: 6.8 g/dL (ref 6.1–8.1)

## 2016-06-29 LAB — CBC
HCT: 44.8 % (ref 35.0–45.0)
HEMOGLOBIN: 15.2 g/dL (ref 11.7–15.5)
MCH: 32.5 pg (ref 27.0–33.0)
MCHC: 33.9 g/dL (ref 32.0–36.0)
MCV: 95.7 fL (ref 80.0–100.0)
MPV: 9.5 fL (ref 7.5–12.5)
Platelets: 354 10*3/uL (ref 140–400)
RBC: 4.68 MIL/uL (ref 3.80–5.10)
RDW: 13.3 % (ref 11.0–15.0)
WBC: 7.6 10*3/uL (ref 3.8–10.8)

## 2016-06-29 LAB — LIPID PANEL
CHOL/HDL RATIO: 5.4 ratio — AB (ref <=5.0)
Cholesterol: 257 mg/dL — ABNORMAL HIGH (ref 125–200)
HDL: 48 mg/dL (ref >=46)
LDL Cholesterol: 166 mg/dL — ABNORMAL HIGH (ref <130)
Triglycerides: 217 mg/dL — ABNORMAL HIGH (ref <150)
VLDL: 43 mg/dL — AB (ref <30)

## 2016-07-04 ENCOUNTER — Telehealth: Payer: Self-pay

## 2016-07-04 DIAGNOSIS — E039 Hypothyroidism, unspecified: Secondary | ICD-10-CM

## 2016-07-04 MED ORDER — LEVOTHYROXINE SODIUM 100 MCG PO TABS: 100.0000 ug | ORAL_TABLET | Freq: Every day | ORAL | 0 refills | Status: DC

## 2016-07-04 NOTE — Telephone Encounter (Signed)
Spoke with pt and informed her orders have been placed.

## 2016-07-04 NOTE — Telephone Encounter (Signed)
Pt would like to know if you can put in future orders for her to walkin and have labs drawn for thyroid screening.

## 2016-07-04 NOTE — Telephone Encounter (Signed)
Sent in to her pharmacy [facility] [address]

## 2016-07-04 NOTE — Telephone Encounter (Signed)
Order placed

## 2016-07-04 NOTE — Telephone Encounter (Signed)
Patient called and stated she is going out of town and needs 2 weeks worth of levothyroxine called [facility] [address].  She will come in for blood work when she returns. Best number [phone]

## 2016-08-14 ENCOUNTER — Other Ambulatory Visit (INDEPENDENT_AMBULATORY_CARE_PROVIDER_SITE_OTHER): Payer: BC Managed Care – PPO

## 2016-08-14 DIAGNOSIS — E039 Hypothyroidism, unspecified: Secondary | ICD-10-CM

## 2016-08-14 LAB — TSH: TSH: 0.63 m[IU]/L

## 2016-10-04 ENCOUNTER — Other Ambulatory Visit: Payer: Self-pay | Admitting: Family Medicine

## 2016-10-04 DIAGNOSIS — E039 Hypothyroidism, unspecified: Secondary | ICD-10-CM

## 2016-10-06 ENCOUNTER — Ambulatory Visit (INDEPENDENT_AMBULATORY_CARE_PROVIDER_SITE_OTHER): Payer: BC Managed Care – PPO | Admitting: Physician Assistant

## 2016-10-06 VITALS — BP 136/80 | HR 81 | Temp 98.1°F | Resp 18 | Ht 60.25 in | Wt 189.6 lb

## 2016-10-06 DIAGNOSIS — R0981 Nasal congestion: Secondary | ICD-10-CM | POA: Diagnosis not present

## 2016-10-06 DIAGNOSIS — H60502 Unspecified acute noninfective otitis externa, left ear: Secondary | ICD-10-CM | POA: Diagnosis not present

## 2016-10-06 MED ORDER — CIPROFLOXACIN-DEXAMETHASONE 0.3-0.1 % OT SUSP: 4.0000 [drp] | Freq: Two times a day (BID) | OTIC | 0 refills | Status: AC

## 2016-10-06 MED ORDER — FLUTICASONE PROPIONATE 50 MCG/ACT NA SUSP: 2.0000 | Freq: Every day | NASAL | 6 refills | Status: DC

## 2016-10-06 NOTE — Progress Notes (Signed)
Deanna BatheMargaret L Stevens  MRN: [id number] DOB: [birth year]  Subjective:  [name] is a 55 y.o. female seen in office today for a chief complaint of left ear pain x 9 days. Has associated itchiness. Pt always has ringing in her ears and some partial hearing loss but nothing new to her knowledge. Denies right ear involvement, drainage, and use of q tips.   Of note, pt states she had a full blown head cold a week ago and all her symptoms resolved with time and sudaphed but this ear pain has persisted.  She has history of seasonal allergies and asthma. She takes claritin daily. Has only tried aspirin for ear ache with no relief.   Review of Systems  Constitutional: Negative for chills, diaphoresis, fatigue and fever.  HENT: Negative for sinus pain, sinus pressure and sore throat.   Respiratory: Negative for cough and shortness of breath.   Gastrointestinal: Negative for diarrhea, nausea and vomiting.    Patient Active Problem List   Diagnosis Date Noted  . Obesity 02/15/2014  . 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 Hypothyroid 06/10/2012  . HTN (hypertension) 06/10/2012    Current Outpatient Prescriptions on File Prior to Visit  Medication Sig Dispense Refill  . cetirizine (ZYRTEC) 10 MG tablet Take 10 mg by mouth daily.    . enalapril (VASOTEC) 20 MG tablet TAKE 2 TABLETS (40 MG TOTAL) BY MOUTH DAILY. 180 tablet 1  . levothyroxine (SYNTHROID, LEVOTHROID) 100 MCG tablet TAKE 1 TABLET (100 MCG TOTAL) BY MOUTH DAILY BEFORE BREAKFAST. 90 tablet 0   No current facility-administered medications on file prior to visit.     Allergies  Allergen Reactions  . Sulfa Antibiotics Hives  . Lipitor [Atorvastatin] Other (See Comments)    Fatigue and confusion     Objective:  BP 136/80 (BP Location: Right Arm, Patient Position: Sitting, Cuff Size: Large)   Pulse 81   Temp 98.1 F (36.7 C) (Oral)   Resp 18   Ht 5' 0.25" (1.53 m)   Wt 189 lb 9.6 oz (86 kg)   SpO2 96%   BMI 36.72 kg/m    Physical Exam  Constitutional: She is oriented to person, place, and time and well-developed, well-nourished, and in no distress.  HENT:  Head: Normocephalic and atraumatic.  Right Ear: Tympanic membrane, external ear and ear canal normal.  Left Ear: Tympanic membrane normal. There is drainage ( thick scaly whitish brownis dishcharge lining ear canal ), swelling ( moderate swelling of ear canal) and tenderness ( minimal with tragus manipulation).  Nose: Mucosal edema and rhinorrhea present. Right sinus exhibits no maxillary sinus tenderness and no frontal sinus tenderness. Left sinus exhibits no maxillary sinus tenderness and no frontal sinus tenderness.  Mouth/Throat: Uvula is midline and mucous membranes are normal. Posterior oropharyngeal erythema present.  Eyes: Conjunctivae are normal.  Neck: Normal range of motion.  Pulmonary/Chest: Effort normal.  Lymphadenopathy:       Head (right side): No submental, no submandibular, no tonsillar, no preauricular, no posterior auricular and no occipital adenopathy present.       Head (left side): No submental, no submandibular, no tonsillar, no preauricular, no posterior auricular and no occipital adenopathy present.    She has no cervical adenopathy.       Right: No supraclavicular adenopathy present.       Left: No supraclavicular adenopathy present.  Neurological: She is alert and oriented to person, place, and time. Gait normal.  Skin: Skin is warm and dry.  Psychiatric: Affect normal.  Vitals reviewed.   Assessment and Plan :   1. Acute otitis externa of left ear, unspecified type -Educational material on how to properly place ear drops given to patient - ciprofloxacin-dexamethasone (CIPRODEX) otic suspension; Place 4 drops into the left ear 2 (two) times daily.  Dispense: 7.5 mL; Refill: 0  2. Nasal congestion - fluticasone (FLONASE) 50 MCG/ACT nasal spray; Place 2 sprays into both nostrils daily.  Dispense: 16 g; Refill:  6  -Follow up in one week if no improvement in symptoms   Benjiman CoreBrittany Osiris Odriscoll PA-C  [facility] 10/06/2016 11:09 AM

## 2016-10-06 NOTE — Patient Instructions (Addendum)
Use flonase and ear drops. If no improvement in one week follow up.   To use the ear drops: -Lie down or tilt your head with your ear facing upward. Open the ear canal by gently pulling your ear back, or pulling downward on the earlobe when giving this medicine to a child. -Hold the dropper upside down over your ear and drop the correct number of drops into the ear. -Stay lying down or with your head tilted for at least 5 minutes. You may use a small piece of cotton to plug the ear and keep the medicine from draining out. -Do not touch the dropper tip or place it directly in your ear. It may become contaminated. Wipe the tip with a clean tissue but do not wash with water or soap. -Use this medicine for the full prescribed length of time. Your symptoms may improve before the infection is completely cleared. Skipping doses may also increase your risk of further infection that is resistant to antibiotics.  Otitis Externa Otitis externa is a germ infection in the outer ear. The outer ear is the area from the eardrum to the outside of the ear. Otitis externa is sometimes called "swimmer's ear." HOME CARE  Put drops in the ear as told by your doctor.  Only take medicine as told by your doctor.  If you have diabetes, your doctor may give you more directions. Follow your doctor's directions.  Keep all doctor visits as told. To avoid another infection:  Keep your ear dry. Use the corner of a towel to dry your ear after swimming or bathing.  Avoid scratching or putting things inside your ear.  Avoid swimming in lakes, dirty water, or pools that use a chemical called chlorine poorly.  You may use ear drops after swimming. Combine equal amounts of white vinegar and alcohol in a bottle. Put 3 or 4 drops in each ear. GET HELP IF:   You have a fever.  Your ear is still red, puffy (swollen), or painful after 3 days.  You still have yellowish-white fluid (pus) coming from the ear after 3  days.  Your redness, puffiness, or pain gets worse.  You have a really bad headache.  You have redness, puffiness, pain, or tenderness behind your ear. MAKE SURE YOU:   Understand these instructions.  Will watch your condition.  Will get help right away if you are not doing well or get worse. This information is not intended to replace advice given to you by your health care provider. Make sure you discuss any questions you have with your health care provider. Document Released: 04/20/2008 Document Revised: 11/23/2014 Document Reviewed: 08/12/2015 Elsevier Interactive Patient Education  2017 [employer]    IF you received an x-ray today, you will receive an invoice from [facility]. Please contact [facility] at [phone] with questions or concerns regarding your invoice.   IF you received labwork today, you will receive an invoice from [facility]. Please contact [facility] at [phone] with questions or concerns regarding your invoice.   Our billing staff will not be able to assist you with questions regarding bills from these companies.  You will be contacted with the lab results as soon as they are available. The fastest way to get your results is to activate your My Chart account. Instructions are located on the last page of this paperwork. If you have not heard from us regarding the results in 2 weeks, please contact this office.

## 2016-10-21 ENCOUNTER — Ambulatory Visit (INDEPENDENT_AMBULATORY_CARE_PROVIDER_SITE_OTHER): Payer: BC Managed Care – PPO | Admitting: Family Medicine

## 2016-10-21 VITALS — BP 134/80 | HR 83 | Temp 98.1°F | Ht 60.25 in | Wt 189.6 lb

## 2016-10-21 DIAGNOSIS — J329 Chronic sinusitis, unspecified: Secondary | ICD-10-CM

## 2016-10-21 DIAGNOSIS — N189 Chronic kidney disease, unspecified: Secondary | ICD-10-CM | POA: Insufficient documentation

## 2016-10-21 DIAGNOSIS — E785 Hyperlipidemia, unspecified: Secondary | ICD-10-CM | POA: Insufficient documentation

## 2016-10-21 MED ORDER — AMOXICILLIN-POT CLAVULANATE 875-125 MG PO TABS: 1.0000 | ORAL_TABLET | Freq: Two times a day (BID) | ORAL | 0 refills | Status: DC

## 2016-10-21 NOTE — Progress Notes (Signed)
   Velvet BatheMargaret L Stevens is a 55 y.o. female who presents [facility] today for sinus congestion  1.  Sinus Congestion Symptoms started about 4-5 weeks ago with sinus congestion, head pressure, ear pressure, cough, and sneeze. Patient works at [employer] and hsa been around several sick students. She has tried taking sudafed, mucinex, and flonase which have not seeme to help some. She was seen last week and started on ear drops for otitis externa. She completed her course and is doing better from that standpoint. No fevers.  ROS as above.  Pertinently, no chest pain, palpitations, SOB, Fever, Chills, Abd pain, N/V/D.   PMH reviewed. Patient is a nonsmoker.   Past Medical History:  Diagnosis Date  . Allergy    dust mite allergies; Zyrtec.  [name]. Blood transfusion without reported diagnosis    post-operatively in 20s after L1 fracture with spinal cord injury after horseback riding.  . Chronic kidney disease    Tuberous S[facility]; [facility].  [name]. Hyperlipidemia   . Hypertension   . Thyroid disease    Past Surgical History:  Procedure Laterality Date  . ABDOMINAL HYSTERECTOMY     DUB; ovaries remaining.  [name]. CESAREAN SECTION    . Sleep Study  12/17/2010   No sleep disordered breathing or periodic limb mvmets.  Excess light sleep.  [name]. SPINE SURGERY     L1 fracture with spinal cord injury with horseback riding.  . TUBAL LIGATION      Medications reviewed. Current Outpatient Prescriptions  Medication Sig Dispense Refill  . cetirizine (ZYRTEC) 10 MG tablet Take 10 mg by mouth daily.    . cholecalciferol (VITAMIN D) 1000 units tablet Take 1,000 Units by mouth daily.    . enalapril (VASOTEC) 20 MG tablet TAKE 2 TABLETS (40 MG TOTAL) BY MOUTH DAILY. 180 tablet 1  . fluticasone (FLONASE) 50 MCG/ACT nasal spray Place 2 sprays into both nostrils daily. 16 g 6  . levothyroxine (SYNTHROID, LEVOTHROID) 100 MCG tablet TAKE 1 TABLET (100 MCG TOTAL) BY MOUTH DAILY BEFORE BREAKFAST. 90 tablet  0  . amoxicillin-clavulanate (AUGMENTIN) 875-125 MG tablet Take 1 tablet by mouth 2 (two) times daily. 14 tablet 0   No current facility-administered medications for this visit.      Physical Exam:  BP 134/80 (BP Location: Left Arm, Patient Position: Sitting, Cuff Size: Small)   Pulse 83   Temp 98.1 F (36.7 C) (Oral)   Ht 5' 0.25" (1.53 m)   Wt 189 lb 9.6 oz (86 kg)   SpO2 95%   BMI 36.72 kg/m  Gen:  Alert, cooperative patient who appears stated age in no acute distress.  Vital signs reviewed. HEENT: TMs clear, though retracted bilaterally. Left external auditory canal with erythema, but no signs of active infection. OP clear. MMM. Left maxillary sinus does not transilluminate.  Pulm:  Clear to auscultation bilaterally with good air movement.  No wheezes or rales noted.   Cardiac:  Regular rate and rhythm without murmur auscultated.  Good S1/S2. Abd:  Soft/nondistended/nontender.  Good bowel sounds throughout all four quadrants.  No masses noted.  Exts: Non edematous BL  LE, warm and well perfused.   Assessment and Plan:  1.  Sinusitis No red flag signs or symptoms. Given prolonged course (>4 weeks), will treat with empiric augmentin per up to date recommendations. Instructed patient to discontinue flonase while on antibiotics. Strict return precautions reviewed. Follow up as needed.

## 2016-10-21 NOTE — Patient Instructions (Addendum)
Take the augmentin 1 pill twice a day for week.  Stop using the flonase while you are on antibiotics. You can use nasal saline if needed.  Come back if you are not getting better in 1-2 weeks.  Take care,  Dr Jimmey RalphParker    IF you received an x-ray today, you will receive an invoice from [facility]. Please contact [facility] at [phone] with questions or concerns regarding your invoice.   IF you received labwork today, you will receive an invoice from [facility]. Please contact [facility] at [phone] with questions or concerns regarding your invoice.   Our billing staff will not be able to assist you with questions regarding bills from these companies.  You will be contacted with the lab results as soon as they are available. The fastest way to get your results is to activate your My Chart account. Instructions are located on the last page of this paperwork. If you have not heard from us regarding the results in 2 weeks, please contact this office.

## 2016-10-21 NOTE — Progress Notes (Signed)
Subjective:    Patient ID: [name], female    DOB: 1961/09/07, 55 y.o.   MRN: [id number] Chief Complaint  Patient presents with  . Annual Exam    no pap     HPI  [name] is a delightful 55 yo woman here today for a complete physical.  She has not had one prior in Epic. She was seen yesterday by my colleague Dr. Gwendolyn Stevens for sinusitis and was started on augmentin x 1 wk.  Last night she had to take decongestant and cough and has only had 1 tab.  Is on week 5 of illness. Still having a lot of sinus headache and eyes feel funny.  Lots of pnd.  Has been taking sudafed and mucinex. Has been on flonase for a while without sig benefit. Has not tried a netti pot or sinus rinse.  Did have an external otitis and has finished the drops last wk Her husband works at [employer] as well.  Primary Preventative Screening: Pelvic: sees Dr. Harold HedgeJames Tomblin at [facility], gyn, pap done 12/18/2014, is s/p abd hysterectomy but retains ovaries. Breast ca screen: last mammogram done last yr and is going to make an appointment for this. CRS: does not want to do colonoscopy but willing to do cologuard Immunizations: tdap 2010  Diet/Exercise: would really like to loose weight.  Keeps yo-yoing with 10 lbs.  Gets exercise occ. Would like to do weight-training but she [facility] really [facility]  Tries to avoid sugar whenever poss. DOes lots of her own cooking, doesn't eat out much. Tries to moderate carbs.  Does yogurt/fruit for breakfast, salad for lunch, biggest meal is dinner at tries to eat at least 3 hrs prior to bed but hard when she has a evening class OTC meds/vit/supp:  Taking vit D and getting calcium in diet.  Chronic medical conditions: HTN: On enalapril 40. Checks outside of office occ.  120-130/80 Hypothyroid: On levothyroxine 100, tsh stable the past 6 mos - felt better when she was on the higher dosage.  She gained 15 lbs when she switched to lower dose. Obesity BM 35:  HPL:  Could not tolerate atrovastatin due to fatigue and mental fogginess/slowing. On fish oil supp. Working on tlc as last lipids 4 mos prior had a non-hdl of 219 -> 209. 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  Past Medical History:  Diagnosis Date  . Allergy    dust mite allergies; Zyrtec.  [name]. Blood transfusion without reported diagnosis    post-operatively in 20s after L1 fracture with spinal cord injury after horseback riding.  . Chronic kidney disease    Tuberous S[facility]  [name]. Hyperlipidemia   . Hypertension   . Thyroid disease    Past Surgical History:  Procedure Laterality Date  . ABDOMINAL HYSTERECTOMY     DUB; ovaries remaining.  [name]. CESAREAN SECTION    . Sleep Study  12/17/2010   No sleep disordered breathing or periodic limb mvmets.  Excess light sleep.  [name]. SPINE SURGERY     L1 fracture with spinal cord injury with horseback riding.  . TUBAL LIGATION     Current Outpatient Prescriptions on File Prior to Visit  Medication Sig Dispense Refill  . amoxicillin-clavulanate (AUGMENTIN) 875-125 MG tablet Take 1 tablet by mouth 2 (two) times daily. 14 tablet 0  . cetirizine (ZYRTEC) 10 MG tablet Take 10 mg by mouth daily.    .[name]  cholecalciferol (VITAMIN D) 1000 units tablet Take 1,000 Units by mouth daily.    . fluticasone (FLONASE) 50 MCG/ACT nasal spray Place 2 sprays into both nostrils daily. 16 g 6  . levothyroxine (SYNTHROID, LEVOTHROID) 100 MCG tablet TAKE 1 TABLET (100 MCG TOTAL) BY MOUTH DAILY BEFORE BREAKFAST. 90 tablet 0   No current facility-administered medications on file prior to visit.    Allergies  Allergen Reactions  . Sulfa Antibiotics Hives  . Lipitor [Atorvastatin] Other (See Comments)    Fatigue and confusion   Family History  Problem Relation Age of Onset  . Thyroid disease Mother   . Arthritis Mother   . Asthma Mother   . Migraines Mother   . Heart disease Father 4080    AMI age 56; CABG at 4585.  .  Arthritis Father    Social History   Social History  . Marital status: Married    Spouse name: N/A  . Number of children: N/A  . Years of education: N/A   Social History Main Topics  . Smoking status: Never Smoker  . Smokeless tobacco: Never Used  . Alcohol use Yes     Comment: occ  . Drug use: No  . Sexual activity: Yes    Birth control/ protection: None   Other Topics Concern  . None   Social History Narrative   Marital status: married x 20+ years; happily married.      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      Tobacco; none      Alcohol:  Socially      Drugs: none      Exercise: sporadic   Immunization History  Administered Date(s) Administered  . Influenza, Seasonal, Injecte, Preservative Fre 12/18/2012  . Influenza,inj,Quad PF,36+ Mos 08/05/2014  . Tdap 06/16/2009   Depression screen [facility] 2/9 10/22/2016 10/21/2016 10/06/2016 04/02/2016 05/31/2015  Decreased Interest 0 0 0 0 0  Down, Depressed, Hopeless 0 0 0 0 0  PHQ - 2 Score 0 0 0 0 0    Review of Systems  see hpi    Objective:   Physical Exam  Constitutional: She is oriented to person, place, and time. She appears well-developed and well-nourished. No distress.  HENT:  Head: Normocephalic and atraumatic.  Right Ear: Tympanic membrane, external ear and ear canal normal.  Left Ear: Tympanic membrane, external ear and ear canal normal.  Nose: Nose normal. No mucosal edema or rhinorrhea.  Mouth/Throat: Uvula is midline, oropharynx is clear and moist and mucous membranes are normal. No posterior oropharyngeal erythema.  Eyes: Conjunctivae and EOM are normal. Pupils are equal, round, and reactive to light. Right eye exhibits no discharge. Left eye exhibits no discharge. No scleral icterus.  Neck: Normal range of motion. Neck supple. No thyromegaly present.  Cardiovascular: Normal rate, regular rhythm, normal heart sounds  and intact distal pulses.   Pulmonary/Chest: Effort normal and breath sounds normal. No respiratory distress.  Abdominal: Soft. Bowel sounds are normal. There is no tenderness.  Musculoskeletal: She exhibits no edema.  Lymphadenopathy:    She has no cervical adenopathy.  Neurological: She is alert and oriented to person, place, and time. She has normal reflexes.  Skin: Skin is warm and dry. She is not diaphoretic. No erythema.  Psychiatric: She has a normal mood and affect. Her behavior is normal.    BP 124/84  Pulse 80   Temp 98.4 F (36.9 C) (Oral)   Resp 16   Ht 5' 0.5" (1.537 m)   Wt 189 lb 3.2 oz (85.8 kg)   SpO2 96%   BMI 36.34 kg/m      Assessment & Plan:  Hep C, hiv, tsh, ua, cbc, cmp, lipid  Mammogram - done   1. Annual physical exam   2. Routine screening for STI (sexually transmitted infection)   3. Encounter for screening mammogram for breast cancer   4. Screening for cardiovascular, respiratory, and genitourinary diseases   5. Screening for colorectal cancer   6. Screening for deficiency anemia   7. Essential hypertension   8. Hypothyroidism, unspecified type   9. Hyperlipidemia, unspecified hyperlipidemia type   10. Class 2 obesity due to excess calories with serious comorbidity and body mass index (BMI) of 36.0 to 36.9 in adult   11. Needs flu shot - deferred today since there is a chance pt may need to be treated shortly with a steroid course for her sinus infection  12. Status post lumbar spine surgery for decompression of spinal cord - Refer to [facility] for guidance in weight training in the setting of the h/o spinal injury    Orders Placed This Encounter  Procedures  . CBC  . Comprehensive metabolic panel    Order Specific Question:   Has the patient fasted?    Answer:   Yes  . TSH  . Lipid panel    Order Specific Question:   Has the patient fasted?    Answer:   Yes  . HIV antibody  . HCV Ab w/Rflx to Verification  . Cologuard  .  Hemoglobin A1c  . Interpretation:  . Hemoglobin A1c  . Specimen status report  . Ambulatory referral to Physical Therapy    Referral Priority:   Routine    Referral Type:   Physical Medicine    Referral Reason:   Specialty Services Required    Requested Specialty:   Physical Therapy    Number of Visits Requested:   1  . POCT urinalysis dipstick    Meds ordered this encounter  Medications  . enalapril (VASOTEC) 20 MG tablet    Sig: TAKE 2 TABLETS (40 MG TOTAL) BY MOUTH DAILY.    Dispense:  180 tablet    Refill:  3  . Albuterol Sulfate (PROAIR RESPICLICK) 108 (90 Base) MCG/ACT AEPB    Sig: Inhale 2 puffs into the lungs every 4 (four) hours as needed (for shortness or breath, cough, and wheeze). Use 30 minutes prior to exercise    Dispense:  1 each    Refill:  2      Norberto Sorenson, M.D.  [facility] [address] [place], [place] [address] [phone] phone [phone] fax  11/17/16 7:40 AM

## 2016-10-22 ENCOUNTER — Ambulatory Visit (INDEPENDENT_AMBULATORY_CARE_PROVIDER_SITE_OTHER): Payer: BC Managed Care – PPO | Admitting: Family Medicine

## 2016-10-22 ENCOUNTER — Encounter: Payer: Self-pay | Admitting: Family Medicine

## 2016-10-22 VITALS — BP 124/84 | HR 80 | Temp 98.4°F | Resp 16 | Ht 60.5 in | Wt 189.2 lb

## 2016-10-22 DIAGNOSIS — Z130 Encounter for screening for diseases of the blood and blood-forming organs and certain disorders involving the immune mechanism: Secondary | ICD-10-CM

## 2016-10-22 DIAGNOSIS — E039 Hypothyroidism, unspecified: Secondary | ICD-10-CM

## 2016-10-22 DIAGNOSIS — Z1383 Encounter for screening for respiratory disorder NEC: Secondary | ICD-10-CM

## 2016-10-22 DIAGNOSIS — Z1211 Encounter for screening for malignant neoplasm of colon: Secondary | ICD-10-CM

## 2016-10-22 DIAGNOSIS — Z1389 Encounter for screening for other disorder: Secondary | ICD-10-CM

## 2016-10-22 DIAGNOSIS — Z23 Encounter for immunization: Secondary | ICD-10-CM

## 2016-10-22 DIAGNOSIS — E6609 Other obesity due to excess calories: Secondary | ICD-10-CM | POA: Diagnosis not present

## 2016-10-22 DIAGNOSIS — Z6836 Body mass index (BMI) 36.0-36.9, adult: Secondary | ICD-10-CM

## 2016-10-22 DIAGNOSIS — Z113 Encounter for screening for infections with a predominantly sexual mode of transmission: Secondary | ICD-10-CM

## 2016-10-22 DIAGNOSIS — Z136 Encounter for screening for cardiovascular disorders: Secondary | ICD-10-CM | POA: Diagnosis not present

## 2016-10-22 DIAGNOSIS — Z1212 Encounter for screening for malignant neoplasm of rectum: Secondary | ICD-10-CM

## 2016-10-22 DIAGNOSIS — Z1231 Encounter for screening mammogram for malignant neoplasm of breast: Secondary | ICD-10-CM | POA: Diagnosis not present

## 2016-10-22 DIAGNOSIS — I10 Essential (primary) hypertension: Secondary | ICD-10-CM | POA: Diagnosis not present

## 2016-10-22 DIAGNOSIS — Z0000 Encounter for general adult medical examination without abnormal findings: Secondary | ICD-10-CM

## 2016-10-22 DIAGNOSIS — E785 Hyperlipidemia, unspecified: Secondary | ICD-10-CM | POA: Diagnosis not present

## 2016-10-22 DIAGNOSIS — IMO0001 Reserved for inherently not codable concepts without codable children: Secondary | ICD-10-CM

## 2016-10-22 DIAGNOSIS — Z9889 Other specified postprocedural states: Secondary | ICD-10-CM | POA: Insufficient documentation

## 2016-10-22 LAB — POCT URINALYSIS DIP (MANUAL ENTRY)
BILIRUBIN UA: NEGATIVE
BILIRUBIN UA: NEGATIVE
GLUCOSE UA: NEGATIVE
Leukocytes, UA: NEGATIVE
NITRITE UA: NEGATIVE
Protein Ur, POC: NEGATIVE
RBC UA: NEGATIVE
SPEC GRAV UA: 1.01
Urobilinogen, UA: 0.2
pH, UA: 6

## 2016-10-22 MED ORDER — ENALAPRIL MALEATE 20 MG PO TABS
ORAL_TABLET | ORAL | 3 refills | Status: DC
Start: 2016-10-22 — End: 2018-03-11

## 2016-10-22 MED ORDER — ALBUTEROL SULFATE 108 (90 BASE) MCG/ACT IN AEPB: 2.0000 | INHALATION_SPRAY | RESPIRATORY_TRACT | 2 refills | Status: DC | PRN

## 2016-10-22 NOTE — Patient Instructions (Addendum)
[facility] has a new weight loss clinic in [place] on [address]. So far, they seem to be getting good results and since it is medically based your insurance might provide better coverage.  It is probably worth checking to see what your ins coverage is. It is called [employer] and you can call [phone] option 1 for more info.   IF you received an x-ray today, you will receive an invoice from [facility]. Please contact [facility] at [phone] with questions or concerns regarding your invoice.   IF you received labwork today, you will receive an invoice from [employer]. Please contact [facility] at [phone] with questions or concerns regarding your invoice.   Our billing staff will not be able to assist you with questions regarding bills from these companies.  You will be contacted with the lab results as soon as they are available. The fastest way to get your results is to activate your My Chart account. Instructions are located on the last page of this paperwork. If you have not heard from [place] regarding the results in 2 weeks, please contact this office.    Exercising to Lose Weight Introduction Exercising can help you to lose weight. In order to lose weight through exercise, you need to do vigorous-intensity exercise. You can tell that you are exercising with vigorous intensity if you are breathing very hard and fast and cannot hold a conversation while exercising. Moderate-intensity exercise helps to maintain your current weight. You can tell that you are exercising at a moderate level if you have a higher heart rate and faster breathing, but you are still able to hold a conversation. How often should I exercise? Choose an activity that you enjoy and set realistic goals. Your health care provider can help you to make an activity plan that works for you. Exercise regularly as directed by your health care provider. This may  include:  Doing resistance training twice each week, such as:  Push-ups.  Sit-ups.  Lifting weights.  Using resistance bands.  Doing a given intensity of exercise for a given amount of time. Choose from these options:  150 minutes of moderate-intensity exercise every week.  75 minutes of vigorous-intensity exercise every week.  A mix of moderate-intensity and vigorous-intensity exercise every week. Children, pregnant women, people who are out of shape, people who are overweight, and older adults may need to consult a health care provider for individual recommendations. If you have any sort of medical condition, be sure to consult your health care provider before starting a new exercise program. What are some activities that can help me to lose weight?  Walking at a rate of at least 4.5 miles an hour.  Jogging or running at a rate of 5 miles per hour.  Biking at a rate of at least 10 miles per hour.  Lap swimming.  Roller-skating or in-line skating.  Cross-country skiing.  Vigorous competitive sports, such as football, basketball, and soccer.  Jumping rope.  Aerobic dancing. How can I be more active in my day-to-day activities?  Use the stairs instead of the elevator.  Take a walk during your lunch break.  If you drive, park your car farther away from work or school.  If you take public transportation, get off one stop early and walk the rest of the way.  Make all of your phone calls while standing up and walking around.  Get up, stretch, and walk around every 30 minutes throughout the  day. What guidelines should I follow while exercising?  Do not exercise so much that you hurt yourself, feel dizzy, or get very short of breath.  Consult your health care provider prior to starting a new exercise program.  Wear comfortable clothes and shoes with good support.  Drink plenty of water while you exercise to prevent dehydration or heat stroke. Body water is lost  during exercise and must be replaced.  Work out until you breathe faster and your heart beats faster. This information is not intended to replace advice given to you by your health care provider. Make sure you discuss any questions you have with your health care provider. Document Released: 12/05/2010 Document Revised: 04/09/2016 Document Reviewed: 04/05/2014  2017 Elsevier   Preventing Complications From Unhealthy Weight Loss Behaviors, Adult Reaching and maintaining a healthy weight is important for your overall health. It is natural to want to lose weight quickly, using whatever methods seem fastest. However, losing weight in a healthy way is not a quick process. Instead, aim for slow, steady weight loss. What lifestyle changes can be made? You can make certain lifestyle changes to help you lose weight in a healthy way. These include eating nutritious foods and exercising regularly. What to avoid:  Following a diet that restricts entire types of food.  Skipping meals to save calories. It is especially important to eat breakfast.  Not eating anything for long periods of time (fasting).  Restricting your calories to far less than the amount that you need to lose or maintain a healthy weight.  Compulsively getting an extreme amount of exercise.  Taking laxative pills to make you have more frequent bowel movements.  Taking medicines to make your body lose excess fluids (diuretics).  Eating an excessive amount of food (bingeing), then making yourself vomit (purging). Healthy behaviors:   Eat a variety of healthy foods, including:  Fruits and vegetables.  Whole grains.  Lean proteins.  Low-fat dairy products.  Drink water instead of sugary drinks.  Plan healthy, low-calorie meals. Work with a Dealernutrition specialist (dietitian) to make a healthy meal plan that works for you and to make an exercise program.  Include different types of exercise in your exercise program, such as  strengthening, aerobic, and flexibility exercises.  To maintain your weight, get at least 150 minutes of moderate-intensity exercise every week. Moderate-intensity exercise could be brisk walking or biking.  To lose a healthy amount of weight, get 60 minutes of moderate-intensity exercise each day.  Find ways to reduce stress, such as regular exercise or meditation.  Find a hobby or other activity that you enjoy to distract you from eating when you feel stressed or bored. Why are these changes important? Making these changes will improve your overall health. Maintaining a healthy weight also lowers your risk of certain conditions, including:  Heart disease.  High cholesterol.  High blood pressure.  Type 2 diabetes.  Stroke.  Osteoarthritis.  Osteoporosis.  Some types of cancer.  Breathing and sleeping disorders. What can happen if changes are not made? Using disordered eating or other unhealthy eating behaviors to try to lose weight can cause:  Fatigue.  Imbalances in electrolytes and chemicals that your body needs to work properly.  Organ damage or failure, especially of the kidneys.  Dehydration.  Imbalances in body fluids.  Low heart rate and blood pressure.  Thin bones that break easily.  Social isolation or relationship problems with your friends and family.  Emotional distress, including depression and anxiety.  A greater risk of an eating disorder. If you develop an eating disorder, you could develop serious health problems and complications that affect yourorgans and bodily processes. These include:  Dry skin and hair.  Hair loss.  Fainting.  Difficulty getting pregnant.  Changes in your heart muscle and the way your heart works.  Severe dehydration.  Damage to the digestive tract and digestive problems.  Long-term (chronic) gastrointestinal problems.  High blood pressure.  High cholesterol.  Heart disease.  Type 2 diabetes. Where  to find support: For more support, talk with:  Your health care provider or dietitian. Ask about support groups.  A mental health care provider.  Family and friends. Where to find more information: Learn more about how to prevent complications from unhealthy weight loss behaviors from:  Centers for Disease Control and Prevention: https://harper.com/www.cdc.gov/healthyweight/losing_weight/getting_started.html  [employer] of Mental Health: InsuranceStats.cawww.nimh.nih.gov/health/publications/eating-disorders-new-trifold/index.shtml  National Eating Disorders Association: www.nationaleatingdisorders.org Contact a health care provider if:  You often feel very tired.  You notice changes in your skin or your hair.  You faint because of dehydration or too much exercise.  You struggle to change your unhealthy weight loss behaviors on your own.  Unhealthy weight loss behaviors are affecting your daily life.  You have signs or symptoms of an eating disorder.  You have major weight changes in a short period of time.  You feel guilty or ashamed about eating or exercising.  You have trouble with your relationships because of your weight loss habits. Summary  Aim for slow, steady weight loss by choosing healthy foods, getting enough calories every day, and exercising regularly.  If you cannot make these changes by yourself, or if you think that you may have an eating disorder, contact your health care provider. This information is not intended to replace advice given to you by your health care provider. Make sure you discuss any questions you have with your health care provider. Document Released: 11/17/2015 Document Revised: 05/22/2016 Document Reviewed: 11/17/2015 Elsevier Interactive Patient Education  2017 [employer]

## 2016-10-23 LAB — COMPREHENSIVE METABOLIC PANEL
A/G RATIO: 1.9 (ref 1.2–2.2)
ALK PHOS: 83 IU/L (ref 39–117)
ALT: 49 IU/L — ABNORMAL HIGH (ref 0–32)
AST: 38 IU/L (ref 0–40)
Albumin: 4.8 g/dL (ref 3.5–5.5)
BUN/Creatinine Ratio: 19 (ref 9–23)
BUN: 16 mg/dL (ref 6–24)
Bilirubin Total: 0.3 mg/dL (ref 0.0–1.2)
CHLORIDE: 103 mmol/L (ref 96–106)
CO2: 23 mmol/L (ref 18–29)
Calcium: 10.2 mg/dL (ref 8.7–10.2)
Creatinine, Ser: 0.84 mg/dL (ref 0.57–1.00)
GFR calc Af Amer: 90 mL/min/{1.73_m2} (ref >59)
GFR calc non Af Amer: 78 mL/min/{1.73_m2} (ref >59)
GLOBULIN, TOTAL: 2.5 g/dL (ref 1.5–4.5)
Glucose: 95 mg/dL (ref 65–99)
POTASSIUM: 4.8 mmol/L (ref 3.5–5.2)
SODIUM: 145 mmol/L — AB (ref 134–144)
Total Protein: 7.3 g/dL (ref 6.0–8.5)

## 2016-10-23 LAB — LIPID PANEL
CHOLESTEROL TOTAL: 243 mg/dL — AB (ref 100–199)
Chol/HDL Ratio: 4.7 ratio units — ABNORMAL HIGH (ref 0.0–4.4)
HDL: 52 mg/dL (ref >39)
LDL Calculated: 168 mg/dL — ABNORMAL HIGH (ref 0–99)
Triglycerides: 115 mg/dL (ref 0–149)
VLDL Cholesterol Cal: 23 mg/dL (ref 5–40)

## 2016-10-23 LAB — CBC
HEMATOCRIT: 43.5 % (ref 34.0–46.6)
Hemoglobin: 15.2 g/dL (ref 11.1–15.9)
MCH: 32.6 pg (ref 26.6–33.0)
MCHC: 34.9 g/dL (ref 31.5–35.7)
MCV: 93 fL (ref 79–97)
PLATELETS: 337 10*3/uL (ref 150–379)
RBC: 4.66 x10E6/uL (ref 3.77–5.28)
RDW: 13.7 % (ref 12.3–15.4)
WBC: 7.5 10*3/uL (ref 3.4–10.8)

## 2016-10-23 LAB — TSH: TSH: 1.1 u[IU]/mL (ref 0.450–4.500)

## 2016-10-23 LAB — HCV INTERPRETATION

## 2016-10-23 LAB — HIV ANTIBODY (ROUTINE TESTING W REFLEX): HIV Screen 4th Generation wRfx: NONREACTIVE

## 2016-10-23 LAB — HCV AB W/RFLX TO VERIFICATION

## 2016-10-24 LAB — HEMOGLOBIN A1C

## 2016-10-27 LAB — HEMOGLOBIN A1C
ESTIMATED AVERAGE GLUCOSE: 123 mg/dL
Hgb A1c MFr Bld: 5.9 % — ABNORMAL HIGH (ref 4.8–5.6)

## 2016-10-27 LAB — SPECIMEN STATUS REPORT

## 2016-12-30 ENCOUNTER — Other Ambulatory Visit: Payer: Self-pay | Admitting: Physician Assistant

## 2016-12-30 DIAGNOSIS — E039 Hypothyroidism, unspecified: Secondary | ICD-10-CM

## 2016-12-31 LAB — COLOGUARD: COLOGUARD: NEGATIVE

## 2017-06-04 ENCOUNTER — Other Ambulatory Visit: Payer: Self-pay | Admitting: Family Medicine

## 2017-06-04 DIAGNOSIS — E039 Hypothyroidism, unspecified: Secondary | ICD-10-CM

## 2017-06-04 NOTE — Telephone Encounter (Signed)
1 month given. Patient needs to schedule a visit with Dr. Clelia CroftShaw for further refills

## 2017-06-07 NOTE — Telephone Encounter (Signed)
mychart message sent to pt about making an apt °

## 2017-06-09 ENCOUNTER — Encounter: Payer: Self-pay | Admitting: Family Medicine

## 2017-06-09 DIAGNOSIS — R7303 Prediabetes: Secondary | ICD-10-CM | POA: Insufficient documentation

## 2017-06-14 ENCOUNTER — Telehealth: Payer: Self-pay | Admitting: Family Medicine

## 2017-06-14 ENCOUNTER — Ambulatory Visit (INDEPENDENT_AMBULATORY_CARE_PROVIDER_SITE_OTHER): Payer: BC Managed Care – PPO | Admitting: Family Medicine

## 2017-06-14 ENCOUNTER — Ambulatory Visit (INDEPENDENT_AMBULATORY_CARE_PROVIDER_SITE_OTHER): Payer: BC Managed Care – PPO

## 2017-06-14 ENCOUNTER — Encounter: Payer: Self-pay | Admitting: Family Medicine

## 2017-06-14 VITALS — BP 124/82 | HR 59 | Temp 97.9°F | Resp 18 | Ht 60.5 in | Wt 156.8 lb

## 2017-06-14 DIAGNOSIS — Z1231 Encounter for screening mammogram for malignant neoplasm of breast: Secondary | ICD-10-CM

## 2017-06-14 DIAGNOSIS — Z77090 Contact with and (suspected) exposure to asbestos: Secondary | ICD-10-CM | POA: Diagnosis not present

## 2017-06-14 DIAGNOSIS — I10 Essential (primary) hypertension: Secondary | ICD-10-CM

## 2017-06-14 DIAGNOSIS — R740 Nonspecific elevation of levels of transaminase and lactic acid dehydrogenase [LDH]: Secondary | ICD-10-CM

## 2017-06-14 DIAGNOSIS — Z1211 Encounter for screening for malignant neoplasm of colon: Secondary | ICD-10-CM

## 2017-06-14 DIAGNOSIS — E785 Hyperlipidemia, unspecified: Secondary | ICD-10-CM

## 2017-06-14 DIAGNOSIS — Z1239 Encounter for other screening for malignant neoplasm of breast: Secondary | ICD-10-CM

## 2017-06-14 DIAGNOSIS — Z1212 Encounter for screening for malignant neoplasm of rectum: Secondary | ICD-10-CM

## 2017-06-14 DIAGNOSIS — R7303 Prediabetes: Secondary | ICD-10-CM

## 2017-06-14 DIAGNOSIS — R7401 Elevation of levels of liver transaminase levels: Secondary | ICD-10-CM

## 2017-06-14 DIAGNOSIS — Z6836 Body mass index (BMI) 36.0-36.9, adult: Secondary | ICD-10-CM

## 2017-06-14 DIAGNOSIS — E039 Hypothyroidism, unspecified: Secondary | ICD-10-CM

## 2017-06-14 NOTE — Progress Notes (Signed)
Subjective:    Patient ID: [name], female    DOB: [birth year], 56 y.o.   MRN: [id number]   Chief Complaint  Patient presents with  . Medication Refill    levothyroxine     HPI Cologard is pending.  Feels great.  She hasn't lost any weight since 5/1 as she as been traveling more this summer and schedule erratic.  Planning to get the work-out routine back when classes resume. Wants to loose some more weight around the middle.   She has a blood draw scheduled for 8/18. Her a1c was still borderline pre-DM several weeks ago. Has appt scheduled for repeat cmp and other labs on 8/18.   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  Then another colleague treated for lung cancer.  Then they started doing asbestos abatement this summer.  She doesn't know what type of lung cancer they had but is interested in screening.   Past Medical History:  Diagnosis Date  . Allergy    dust mite allergies; Zyrtec.  [name]. Blood transfusion without reported diagnosis    post-operatively in 20s after L1 fracture with spinal cord injury after horseback riding.  . Chronic kidney disease    Tuberous Sclerosis; [facility] at [facility].  [name]. Hyperlipidemia   . Hypertension   . Thyroid disease    Past Surgical History:  Procedure Laterality Date  . ABDOMINAL HYSTERECTOMY     DUB; ovaries remaining.  [name]. CESAREAN SECTION    . Sleep Study  12/17/2010   No sleep disordered breathing or periodic limb mvmets.  Excess light sleep.  [name]. SPINE SURGERY     L1 fracture with spinal cord injury with horseback riding.  . TUBAL LIGATION     Current Outpatient Prescriptions on File Prior to Visit  Medication Sig Dispense Refill  . Albuterol Sulfate (PROAIR RESPICLICK) 108 (90 Base) MCG/ACT AEPB Inhale 2 puffs into the lungs every 4 (four) hours as needed (for shortness or breath, cough, and wheeze). Use 30 minutes prior to exercise 1  each 2  . cetirizine (ZYRTEC) 10 MG tablet Take 5 mg by mouth daily.     . cholecalciferol (VITAMIN D) 1000 units tablet Take 1,000 Units by mouth daily.    . enalapril (VASOTEC) 20 MG tablet TAKE 2 TABLETS (40 MG TOTAL) BY MOUTH DAILY. (Patient taking differently: 20 mg. TAKE 2 TABLETS (40 MG TOTAL) BY MOUTH DAILY.) 180 tablet 3  . fluticasone (FLONASE) 50 MCG/ACT nasal spray Place 2 sprays into both nostrils daily. 16 g 6  . levothyroxine (SYNTHROID, LEVOTHROID) 100 MCG tablet TAKE 1 TABLET (100 MCG TOTAL) BY MOUTH DAILY BEFORE BREAKFAST. 30 tablet 0   No current facility-administered medications on file prior to visit.    Allergies  Allergen Reactions  . Sulfa Antibiotics Hives  . Lipitor [Atorvastatin] Other (See Comments)    Fatigue and confusion   Family History  Problem Relation Age of Onset  . Thyroid disease Mother   . Arthritis Mother   . Asthma Mother   . Migraines Mother   . Heart disease Father 480       AMI age 57; CABG at 6185.  . Arthritis Father    Social History   Social History  . Marital status: Married    Spouse name: N/A  . Number of children: N/A  . Years of education: N/A   Social History  Main Topics  . Smoking status: Never Smoker  . Smokeless tobacco: Never Used  . Alcohol use Yes     Comment: occ  . Drug use: No  . Sexual activity: Yes    Birth control/ protection: None   Other Topics Concern  . None   Social History Narrative   Marital status: married x 20+ years; happily married.      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      Tobacco; none      Alcohol:  Socially      Drugs: none      Exercise: sporadic   Depression screen [facility] 2/9 06/14/2017 10/22/2016 10/21/2016 10/06/2016 04/02/2016  Decreased Interest 0 0 0 0 0  Down, Depressed, Hopeless 0 0 0 0 0  PHQ - 2 Score 0 0 0 0 0    Review of Systems See hpi    Objective:   Physical Exam    Constitutional: She is oriented to person, place, and time. She appears well-developed and well-nourished. No distress.  HENT:  Head: Normocephalic and atraumatic.  Right Ear: External ear normal.  Left Ear: External ear normal.  Eyes: Conjunctivae are normal. No scleral icterus.  Neck: Normal range of motion. Neck supple. No thyromegaly present.  Cardiovascular: Normal rate, regular rhythm, normal heart sounds and intact distal pulses.   Pulmonary/Chest: Effort normal and breath sounds normal. No respiratory distress.  Musculoskeletal: She exhibits no edema.  Lymphadenopathy:    She has no cervical adenopathy.  Neurological: She is alert and oriented to person, place, and time.  Skin: Skin is warm and dry. She is not diaphoretic. No erythema.  Psychiatric: She has a normal mood and affect. Her behavior is normal.     BP 124/82   Pulse (!) 59   Temp 97.9 F (36.6 C) (Oral)   Resp 18   Ht 5' 0.5" (1.537 m)   Wt 156 lb 12.8 oz (71.1 kg)   SpO2 97%   BMI 30.12 kg/m   Dg Chest 2 View  Result Date: 06/14/2017 CLINICAL DATA:  56 year old female with asbestos exposure. EXAM: CHEST  2 VIEW COMPARISON:  11/22/2012 lumbar spine MR FINDINGS: The cardiomediastinal silhouette is unremarkable. No definite interstitial opacities noted. There is no evidence of focal airspace disease, pulmonary edema, suspicious pulmonary nodule/mass, pleural plaques, pleural effusion, or pneumothorax. No acute bony abnormalities are identified. A remote L1 compression fracture again noted. IMPRESSION: No evidence of active cardiopulmonary disease. No definite interstitial lung disease radiographically. Electronically Signed   By: Harmon Pier M.D.   On: 06/14/2017 11:55       Assessment & Plan:   1. Hypothyroidism, unspecified type - tsh slightly elevated at 4.7 so will increase levothyroxine from 100 to 112 - recheck in lab only visit in 6-8 wks, orders entered.  2. Essential hypertension   3.  Hyperlipidemia, unspecified hyperlipidemia type   4. Prediabetes -  Being rechecked at bariatric clinic  5. Class 2 severe obesity due to excess calories with serious comorbidity and body mass index (BMI) of 36.0 to 36.9 in adult (HCC)   6. Transaminitis - will have lfts rechecked at the bariatric clinic and forwarded here.  7. Screening for breast cancer   8. H/O asbestos exposure   9. Asbestos exposure     Orders Placed This Encounter  Procedures  . MM DIGITAL SCREENING  BILATERAL    Standing Status:   Future    Standing Expiration Date:   08/15/2018    Order Specific Question:   Reason for Exam (SYMPTOM  OR DIAGNOSIS REQUIRED)    Answer:   screening for breast cancer    Order Specific Question:   Is the patient pregnant?    Answer:   No    Order Specific Question:   Preferred imaging location?    Answer:   [facility]  . DG Chest 2 View    Standing Status:   Future    Number of Occurrences:   1    Standing Expiration Date:   06/14/2018    Order Specific Question:   Reason for Exam (SYMPTOM  OR DIAGNOSIS REQUIRED)    Answer:   decades of asbestos exposure    Order Specific Question:   Is the patient pregnant?    Answer:   No    Order Specific Question:   Preferred imaging location?    Answer:   External  . TSH     Norberto SorensonEva Aadhya Bustamante, M.D.  [facility] at [facility]  [place] [address] [place], [place] [address] [phone] phone [phone]) [phone] fax  06/15/17 7:40 PM

## 2017-06-14 NOTE — Telephone Encounter (Signed)
Pt never received cologuard in the mail when it was ordered last Dec - please check with the [facility] lab to see if kit was mailed and if so, needs ot have another one sent. Please confirm mailing address.

## 2017-06-14 NOTE — Patient Instructions (Addendum)
[facility] [facility] is now offering annual lung cancer screening by low-dose CT scan.  This is covered for qualifying patients and your insurance will be checked before the procedure.  Call the lung cancer screening nurse navigators at [phone] to learn more about this and cost since your insurance is unlikely to cover.     IF you received an x-ray today, you will receive an invoice from [facility]. Please contact [facility] at [phone] with questions or concerns regarding your invoice.   IF you received labwork today, you will receive an invoice from [facility]. Please contact [facility] at [phone] with questions or concerns regarding your invoice.   Our billing staff will not be able to assist you with questions regarding bills from these companies.  You will be contacted with the lab results as soon as they are available. The fastest way to get your results is to activate your My Chart account. Instructions are located on the last page of this paperwork. If you have not heard from us regarding the results in 2 weeks, please contact this office.      We recommend that you schedule a mammogram for breast cancer screening. Typically, you do not need a referral to do this. Please contact a local imaging center to schedule your mammogram.  [facility] - [phone]) [phone]  *ask for the Radiology Department [facility] - [phone] or [phone]) [phone]  [facility] - [phone]) [phone] [facility] - [phone]) [phone] [facility] - [phone]) [phone]  *ask for the Radiology Department [facility] - [phone]) [phone]  *ask for the Radiology Department [facility] - [phone]  *ask for the Mammography Department [facility] - [phone](336) [phone]   Lung Cancer Screening A lung cancer screening is a test that checks for lung cancer. Lung cancer screening is done to look for lung cancer in its  very early stages, before it spreads and becomes harder to treat and before symptoms appear. Finding cancer early improves the chances of successful treatment. It may save your life. Should I be screened for lung cancer? You should be screened for lung cancer if all of these apply:  You currently smoke or you have quit smoking within the past 15 years.  You are 6455-57 years old. Screening may be recommended up to age 57 depending on your overall health and other factors.  You are in good general health.  You have a 30-pack-year smoking history.  To find your pack-year history, multiply how many packages of cigarettes you smoked each day by the number of years you smoked. For example, if you smoked two packs of cigarettes each day for 15 years, your pack-year history is 30. If you are not sure what your pack-year smoking history is, ask your health care provider. Screening may also be recommended if you are at high risk for the disease. You may be at high risk if:  You have a family history of lung cancer.  You have been exposed to asbestos.  You have chronic obstructive pulmonary disease (COPD).  You have a history of previous lung cancer.  How often should I be screened for lung cancer? If you are at risk for lung cancer, it is recommended that you are screened once a year. The recommended screening test is a low-dose CT scan. How can I lower my risk of lung cancer? To lower your risk of developing lung cancer:  If you smoke, stop smoking all tobacco  products.  Avoid secondhand smoke.  Avoid exposure to radiation.  Avoid exposure to radon gas. Have your home checked for radon regularly.  Avoid things that cause cancer (carcinogens).  Avoid living or working in places with high air pollution.  Where to find more information: Ask your health care provider about the risks and benefits of screening. More information and resources are available from these  organizations:  American Cancer Society (ACS): www.cancer.org  American Lung Association: www.lung.org  Contact a health care provider if:  You start to show symptoms of lung cancer, including: ? Coughing that will not go away. ? Wheezing. ? Chest pain. ? Coughing up blood. ? Shortness of breath. ? Weight loss that cannot be explained. ? Constant fatigue. Summary  Lung cancer screening may find lung cancer before symptoms appear. Finding cancer early improves the chances of successful treatment. It may save your life.  If you are at risk for lung cancer, it is recommended that you are screened once a year. The recommended screening test is a low-dose CT scan.  You can make lifestyle changes to lower your risk of lung cancer.  Ask your health care provider about the risks and benefits of screening. This information is not intended to replace advice given to you by your health care provider. Make sure you discuss any questions you have with your health care provider. Document Released: 09/23/2016 Document Revised: 09/23/2016 Document Reviewed: 09/23/2016 Elsevier Interactive Patient Education  [employer]2018 Elsevier Inc.

## 2017-06-15 LAB — TSH: TSH: 4.7 u[IU]/mL — AB (ref 0.450–4.500)

## 2017-06-15 MED ORDER — LEVOTHYROXINE SODIUM 112 MCG PO TABS: 112.0000 ug | ORAL_TABLET | Freq: Every day | ORAL | 0 refills | Status: DC

## 2017-06-18 ENCOUNTER — Other Ambulatory Visit: Payer: Self-pay

## 2017-06-18 DIAGNOSIS — Z1211 Encounter for screening for malignant neoplasm of colon: Secondary | ICD-10-CM

## 2017-06-18 DIAGNOSIS — Z1212 Encounter for screening for malignant neoplasm of rectum: Principal | ICD-10-CM

## 2017-06-18 NOTE — Telephone Encounter (Signed)
Cologuard test not on file for this pt with [facility] reordered test for pt.

## 2017-07-11 ENCOUNTER — Other Ambulatory Visit: Payer: Self-pay | Admitting: Family Medicine

## 2017-07-11 DIAGNOSIS — E039 Hypothyroidism, unspecified: Secondary | ICD-10-CM

## 2017-07-13 LAB — COLOGUARD: Cologuard: NEGATIVE

## 2017-07-13 NOTE — Addendum Note (Signed)
Addended by: Baldwin Crown D on: 07/13/2017 11:19 AM   Modules accepted: Orders

## 2017-09-13 ENCOUNTER — Other Ambulatory Visit: Payer: Self-pay | Admitting: Family Medicine

## 2017-09-13 DIAGNOSIS — E039 Hypothyroidism, unspecified: Secondary | ICD-10-CM

## 2017-09-16 ENCOUNTER — Ambulatory Visit (INDEPENDENT_AMBULATORY_CARE_PROVIDER_SITE_OTHER): Payer: BC Managed Care – PPO | Admitting: Family Medicine

## 2017-09-16 DIAGNOSIS — Z23 Encounter for immunization: Secondary | ICD-10-CM | POA: Diagnosis not present

## 2017-09-16 DIAGNOSIS — E039 Hypothyroidism, unspecified: Secondary | ICD-10-CM

## 2017-09-17 LAB — TSH: TSH: 1.36 u[IU]/mL (ref 0.450–4.500)

## 2017-09-17 MED ORDER — LEVOTHYROXINE SODIUM 112 MCG PO TABS: 112.0000 ug | ORAL_TABLET | Freq: Every day | ORAL | 3 refills | Status: DC

## 2017-09-17 NOTE — Progress Notes (Signed)
Lab only visit. Pt not seen by provider.

## 2017-11-18 ENCOUNTER — Ambulatory Visit: Payer: BC Managed Care – PPO | Admitting: Family Medicine

## 2017-11-18 ENCOUNTER — Encounter: Payer: Self-pay | Admitting: Family Medicine

## 2017-11-18 ENCOUNTER — Other Ambulatory Visit: Payer: Self-pay

## 2017-11-18 VITALS — BP 118/70 | HR 80 | Temp 98.4°F | Resp 16 | Ht 60.5 in | Wt 160.0 lb

## 2017-11-18 DIAGNOSIS — J0100 Acute maxillary sinusitis, unspecified: Secondary | ICD-10-CM

## 2017-11-18 DIAGNOSIS — H66002 Acute suppurative otitis media without spontaneous rupture of ear drum, left ear: Secondary | ICD-10-CM

## 2017-11-18 MED ORDER — AMOXICILLIN-POT CLAVULANATE 875-125 MG PO TABS: 1.0000 | ORAL_TABLET | Freq: Two times a day (BID) | ORAL | 0 refills | Status: DC

## 2017-11-18 NOTE — Patient Instructions (Addendum)
Hot showers or breathing in steam may help loosen the congestion.  Using a netti pot or sinus rinse is also likely to help you feel better and keep this from progressing.  Use the fluticasone nasal spray every night before bed for at least 2 weeks.  I recommend augmenting with 12 hr sudafed (behind the counter) and generic mucinex to help you move out the congestion.  If no improvement or you are getting worse, come back as you might need a course of steroids but hopefully with all of the above, you can avoid it.    IF you received an x-ray today, you will receive an invoice from [facility]. Please contact [facility] at [phone] with questions or concerns regarding your invoice.   IF you received labwork today, you will receive an invoice from [place]. Please contact [facility] at [phone] with questions or concerns regarding your invoice.   Our billing staff will not be able to assist you with questions regarding bills from these companies.  You will be contacted with the lab results as soon as they are available. The fastest way to get your results is to activate your My Chart account. Instructions are located on the last page of this paperwork. If you have not heard from [place] regarding the results in 2 weeks, please contact this office.      Sinusitis, Adult Sinusitis is soreness and inflammation of your sinuses. Sinuses are hollow spaces in the bones around your face. Your sinuses are located:  Around your eyes.  In the middle of your forehead.  Behind your nose.  In your cheekbones.  Your sinuses and nasal passages are lined with a stringy fluid (mucus). Mucus normally drains out of your sinuses. When your nasal tissues become inflamed or swollen, the mucus can become trapped or blocked so air cannot flow through your sinuses. This allows bacteria, viruses, and funguses to grow, which leads to infection. Sinusitis can develop quickly and last for 7?10 days  (acute) or for more than 12 weeks (chronic). Sinusitis often develops after a cold. What are the causes? This condition is caused by anything that creates swelling in the sinuses or stops mucus from draining, including:  Allergies.  Asthma.  Bacterial or viral infection.  Abnormally shaped bones between the nasal passages.  Nasal growths that contain mucus (nasal polyps).  Narrow sinus openings.  Pollutants, such as chemicals or irritants in the air.  A foreign object stuck in the nose.  A fungal infection. This is rare.  What increases the risk? The following factors may make you more likely to develop this condition:  Having allergies or asthma.  Having had a recent cold or respiratory tract infection.  Having structural deformities or blockages in your nose or sinuses.  Having a weak immune system.  Doing a lot of swimming or diving.  Overusing nasal sprays.  Smoking.  What are the signs or symptoms? The main symptoms of this condition are pain and a feeling of pressure around the affected sinuses. Other symptoms include:  Upper toothache.  Earache.  Headache.  Bad breath.  Decreased sense of smell and taste.  A cough that may get worse at night.  Fatigue.  Fever.  Thick drainage from your nose. The drainage is often green and it may contain pus (purulent).  Stuffy nose or congestion.  Postnasal drip. This is when extra mucus collects in the throat or back of the nose.  Swelling and warmth over the affected sinuses.  Sore  throat.  Sensitivity to light.  How is this diagnosed? This condition is diagnosed based on symptoms, a medical history, and a physical exam. To find out if your condition is acute or chronic, your health care provider may:  Look in your nose for signs of nasal polyps.  Tap over the affected sinus to check for signs of infection.  View the inside of your sinuses using an imaging device that has a light attached  (endoscope).  If your health care provider suspects that you have chronic sinusitis, you may also:  Be tested for allergies.  Have a sample of mucus taken from your nose (nasal culture) and checked for bacteria.  Have a mucus sample examined to see if your sinusitis is related to an allergy.  If your sinusitis does not respond to treatment and it lasts longer than 8 weeks, you may have an MRI or CT scan to check your sinuses. These scans also help to determine how severe your infection is. In rare cases, a bone biopsy may be done to rule out more serious types of fungal sinus disease. How is this treated? Treatment for sinusitis depends on the cause and whether your condition is chronic or acute. If a virus is causing your sinusitis, your symptoms will go away on their own within 10 days. You may be given medicines to relieve your symptoms, including:  Topical nasal decongestants. They shrink swollen nasal passages and let mucus drain from your sinuses.  Antihistamines. These drugs block inflammation that is triggered by allergies. This can help to ease swelling in your nose and sinuses.  Topical nasal corticosteroids. These are nasal sprays that ease inflammation and swelling in your nose and sinuses.  Nasal saline washes. These rinses can help to get rid of thick mucus in your nose.  If your condition is caused by bacteria, you will be given an antibiotic medicine. If your condition is caused by a fungus, you will be given an antifungal medicine. Surgery may be needed to correct underlying conditions, such as narrow nasal passages. Surgery may also be needed to remove polyps. Follow these instructions at home: Medicines  Take, use, or apply over-the-counter and prescription medicines only as told by your health care provider. These may include nasal sprays.  If you were prescribed an antibiotic medicine, take it as told by your health care provider. Do not stop taking the antibiotic  even if you start to feel better. Hydrate and Humidify  Drink enough water to keep your urine clear or pale yellow. Staying hydrated will help to thin your mucus.  Use a cool mist humidifier to keep the humidity level in your home above 50%.  Inhale steam for 10-15 minutes, 3-4 times a day or as told by your health care provider. You can do this in the bathroom while a hot shower is running.  Limit your exposure to cool or dry air. Rest  Rest as much as possible.  Sleep with your head raised (elevated).  Make sure to get enough sleep each night. General instructions  Apply a warm, moist washcloth to your face 3-4 times a day or as told by your health care provider. This will help with discomfort.  Wash your hands often with soap and water to reduce your exposure to viruses and other germs. If soap and water are not available, use hand sanitizer.  Do not smoke. Avoid being around people who are smoking (secondhand smoke).  Keep all follow-up visits as told by your health  care provider. This is important. Contact a health care provider if:  You have a fever.  Your symptoms get worse.  Your symptoms do not improve within 10 days. Get help right away if:  You have a severe headache.  You have persistent vomiting.  You have pain or swelling around your face or eyes.  You have vision problems.  You develop confusion.  Your neck is stiff.  You have trouble breathing. This information is not intended to replace advice given to you by your health care provider. Make sure you discuss any questions you have with your health care provider. Document Released: 11/02/2005 Document Revised: 06/28/2016 Document Reviewed: 08/28/2015 Elsevier Interactive Patient Education  2018 [employer].  Otitis Media, Adult Otitis media occurs when there is inflammation and fluid in the middle ear. Your middle ear is a part of the ear that contains bones for hearing as well as air that helps  send sounds to your brain. What are the causes? This condition is caused by a blockage in the eustachian tube. This tube drains fluid from the ear to the back of the nose (nasopharynx). A blockage in this tube can be caused by an object or by swelling (edema) in the tube. Problems that can cause a blockage include:  A cold or other upper respiratory infection.  Allergies.  An irritant, such as tobacco smoke.  Enlarged adenoids. The adenoids are areas of soft tissue located high in the back of the throat, behind the nose and the roof of the mouth.  A mass in the nasopharynx.  Damage to the ear caused by pressure changes (barotrauma).  What are the signs or symptoms? Symptoms of this condition include:  Ear pain.  A fever.  Decreased hearing.  A headache.  Tiredness (lethargy).  Fluid leaking from the ear.  Ringing in the ear.  How is this diagnosed? This condition is diagnosed with a physical exam. During the exam your health care provider will use an instrument called an otoscope to look into your ear and check for redness, swelling, and fluid. He or she will also ask about your symptoms. Your health care provider may also order tests, such as:  A test to check the movement of the eardrum (pneumatic otoscopy). This test is done by squeezing a small amount of air into the ear.  A test that changes air pressure in the middle ear to check how well the eardrum moves and whether the eustachian tube is working (tympanogram).  How is this treated? This condition usually goes away on its own within 3-5 days. But if the condition is caused by a bacteria infection and does not go away own its own, or keeps coming back, your health care provider may:  Prescribe antibiotic medicines to treat the infection.  Prescribe or recommend medicines to control pain.  Follow these instructions at home:  Take over-the-counter and prescription medicines only as told by your health care  provider.  If you were prescribed an antibiotic medicine, take it as told by your health care provider. Do not stop taking the antibiotic even if you start to feel better.  Keep all follow-up visits as told by your health care provider. This is important. Contact a health care provider if:  You have bleeding from your nose.  There is a lump on your neck.  You are not getting better in 5 days.  You feel worse instead of better. Get help right away if:  You have severe pain that  is not controlled with medicine.  You have swelling, redness, or pain around your ear.  You have stiffness in your neck.  A part of your face is paralyzed.  The bone behind your ear (mastoid) is tender when you touch it.  You develop a severe headache. Summary  Otitis media is redness, soreness, and swelling of the middle ear.  This condition usually goes away on its own within 3-5 days.  If the problem does not go away in 3-5 days, your health care provider may prescribe or recommend medicines to treat your symptoms.  If you were prescribed an antibiotic medicine, take it as told by your health care provider. This information is not intended to replace advice given to you by your health care provider. Make sure you discuss any questions you have with your health care provider. Document Released: 08/07/2004 Document Revised: 10/23/2016 Document Reviewed: 10/23/2016 Elsevier Interactive Patient Education  [employer].

## 2017-11-18 NOTE — Progress Notes (Signed)
Subjective:  By signing my name below, I, [name], attest that this documentation has been prepared under the direction and in the presence of Norberto Sorenson, MD Electronically Signed: Charline Bills, ED Scribe 11/18/2017 at 5:08 PM.   Patient ID: [name], female    DOB: Mar 02, 1961, 57 y.o.   MRN: [id number]  Chief Complaint  Patient presents with  . Sore Throat    left ear pain, woke up yesterday with hot cold chills and sweats body aches,    HPI [name] is a 57 y.o. female who presents to Primary Care at [facility] complaining of L ear pain and sore throat onset yesterday morning upon waking. Pt states that she was ill 2.5 wks ago with symptoms of cough, HA, generalized body aches, chills, subjective fever for 1 week that resolved. However, yesterday morning she woke up with subjective fever and generalized body aches which have resolved. Today she is experiencing L ear pain, sore throat and postnasal drip. Pt took Sudafed ~2.5 wks ago when she was first ill but has not taken any since. She has not taken Flonase and is currently taking 1/2 tab of Zyrtec twice/week. Denies appetite change, change in bladder/bowels.  Past Medical History:  Diagnosis Date  . Allergy    dust mite allergies; Zyrtec.  [name] Blood transfusion without reported diagnosis    post-operatively in 20s after L1 fracture with spinal cord injury after horseback riding.  . Chronic kidney disease    Tuberous Sclerosis; [facility].  [name] Hyperlipidemia   . Hypertension   . Thyroid disease    Current Outpatient Medications on File Prior to Visit  Medication Sig Dispense Refill  . Albuterol Sulfate (PROAIR RESPICLICK) 108 (90 Base) MCG/ACT AEPB Inhale 2 puffs into the lungs every 4 (four) hours as needed (for shortness or breath, cough, and wheeze). Use 30 minutes prior to exercise 1 each 2  . cetirizine (ZYRTEC) 10 MG tablet Take 5 mg by mouth daily.     . cholecalciferol (VITAMIN D) 1000 units tablet Take  1,000 Units by mouth daily.    . enalapril (VASOTEC) 20 MG tablet TAKE 2 TABLETS (40 MG TOTAL) BY MOUTH DAILY. (Patient taking differently: 20 mg. TAKE 2 TABLETS (40 MG TOTAL) BY MOUTH DAILY.) 180 tablet 3  . fluticasone (FLONASE) 50 MCG/ACT nasal spray Place 2 sprays into both nostrils daily. 16 g 6  . levothyroxine (SYNTHROID, LEVOTHROID) 112 MCG tablet Take 1 tablet (112 mcg total) by mouth daily before breakfast. 90 tablet 3   No current facility-administered medications on file prior to visit.    Allergies  Allergen Reactions  . Sulfa Antibiotics Hives  . Lipitor [Atorvastatin] Other (See Comments)    Fatigue and confusion   Past Surgical History:  Procedure Laterality Date  . ABDOMINAL HYSTERECTOMY     DUB; ovaries remaining.  [name] CESAREAN SECTION    . Sleep Study  12/17/2010   No sleep disordered breathing or periodic limb mvmets.  Excess light sleep.  [name] SPINE SURGERY     L1 fracture with spinal cord injury with horseback riding.  . TUBAL LIGATION     Family History  Problem Relation Age of Onset  . Thyroid disease Mother   . Arthritis Mother   . Asthma Mother   . Migraines Mother   . Heart disease Father 34       AMI age 29; CABG at 30.  . Arthritis Father    Social History   Socioeconomic  History  . Marital status: Married    Spouse name: None  . Number of children: None  . Years of education: None  . Highest education level: None  Social Needs  . Financial resource strain: None  . Food insecurity - worry: None  . Food insecurity - inability: None  . Transportation needs - medical: None  . Transportation needs - non-medical: None  Occupational History  . None  Tobacco Use  . Smoking status: Never Smoker  . Smokeless tobacco: Never Used  Substance and Sexual Activity  . Alcohol use: Yes    Comment: occ  . Drug use: No  . Sexual activity: Yes    Birth control/protection: None  Other Topics Concern  . None  Social History Narrative   Marital status:  married x 20+ years; happily married.      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      Tobacco; none      Alcohol:  Socially      Drugs: none      Exercise: sporadic   Depression screen [facility] 2/9 11/18/2017 06/14/2017 10/22/2016 10/21/2016 10/06/2016  Decreased Interest 0 0 0 0 0  Down, Depressed, Hopeless 0 0 0 0 0  PHQ - 2 Score 0 0 0 0 0    Review of Systems  Constitutional: Positive for chills and diaphoresis. Negative for appetite change.  HENT: Positive for ear pain and sore throat.   Gastrointestinal: Negative for constipation.  Genitourinary: Negative for decreased urine volume.  Musculoskeletal: Positive for myalgias.      Objective:   Physical Exam  Constitutional: She is oriented to person, place, and time. She appears well-developed and well-nourished. No distress.  HENT:  Head: Normocephalic and atraumatic.  Right Ear: Tympanic membrane normal.  Left Ear: Tympanic membrane is injected and erythematous.  Mouth/Throat: Posterior oropharyngeal erythema present. No oropharyngeal exudate.  L nare: occluded with purulent rhinitis. R nare: normal.  Eyes: Conjunctivae and EOM are normal.  Neck: Neck supple. No tracheal deviation present.  Cardiovascular: Normal rate.  Murmur heard.  Systolic (injection, R upper sternal border) murmur is present with a grade of 1/6. Pulmonary/Chest: Effort normal. No respiratory distress.  Musculoskeletal: Normal range of motion.  Neurological: She is alert and oriented to person, place, and time.  Skin: Skin is warm and dry.  Psychiatric: She has a normal mood and affect. Her behavior is normal.  Nursing note and vitals reviewed.  BP 118/70   Pulse 80   Temp 98.4 F (36.9 C)   Resp 16   Ht 5' 0.5" (1.537 m)   Wt 160 lb (72.6 kg)   SpO2 96%   BMI 30.73 kg/m     Assessment & Plan:   1. Acute non-recurrent maxillary sinusitis   2. Acute  suppurative otitis media of left ear without spontaneous rupture of tympanic membrane, recurrence not specified     Meds ordered this encounter  Medications  . amoxicillin-clavulanate (AUGMENTIN) 875-125 MG tablet    Sig: Take 1 tablet by mouth 2 (two) times daily.    Dispense:  20 tablet    Refill:  0    I personally performed the services described in this documentation, which was scribed in my presence. The recorded information has been reviewed and considered, and addended by me as needed.   Norberto SorensonEva Jaquanna Ballentine, M.D.  [facility]  [facility] [address] [place], [place] [address] [phone] phone [phone] fax  11/21/17 12:31 PM

## 2017-11-30 ENCOUNTER — Other Ambulatory Visit: Payer: Self-pay | Admitting: Family Medicine

## 2017-11-30 NOTE — Telephone Encounter (Signed)
Last OV 10/22/16 with Dr. Clelia CroftShaw where this was addressed (Vasotec).

## 2018-03-10 ENCOUNTER — Telehealth: Payer: Self-pay | Admitting: Family Medicine

## 2018-03-10 NOTE — Telephone Encounter (Signed)
Copied from CRM #[id number]. [id number]: Quick Communication - Rx Refill/Question >> Mar 10, 2018  8:43 AM Oneal GroutSebastian, Jennifer S wrote: Medication: enalapril (VASOTEC) 20 MG tablet, patient has appt on 03/25/18 Has the patient contacted their pharmacy? Yes.   (Agent: If no, request that the patient contact the pharmacy for the refill.) Preferred Pharmacy (with phone number or street name): [facility] [address] Agent: Please be advised that RX refills may take up to 3 business days. We ask that you follow-up with your pharmacy.

## 2018-03-11 MED ORDER — ENALAPRIL MALEATE 20 MG PO TABS: ORAL_TABLET | ORAL | 3 refills | Status: DC

## 2018-03-25 ENCOUNTER — Other Ambulatory Visit: Payer: Self-pay

## 2018-03-25 ENCOUNTER — Ambulatory Visit: Payer: BC Managed Care – PPO | Admitting: Family Medicine

## 2018-03-25 ENCOUNTER — Encounter: Payer: Self-pay | Admitting: Family Medicine

## 2018-03-25 VITALS — BP 108/74 | HR 66 | Temp 98.0°F | Resp 18 | Ht 60.5 in | Wt 161.8 lb

## 2018-03-25 DIAGNOSIS — E039 Hypothyroidism, unspecified: Secondary | ICD-10-CM | POA: Diagnosis not present

## 2018-03-25 DIAGNOSIS — Z0000 Encounter for general adult medical examination without abnormal findings: Secondary | ICD-10-CM | POA: Diagnosis not present

## 2018-03-25 DIAGNOSIS — E782 Mixed hyperlipidemia: Secondary | ICD-10-CM

## 2018-03-25 DIAGNOSIS — I10 Essential (primary) hypertension: Secondary | ICD-10-CM

## 2018-03-25 DIAGNOSIS — R7303 Prediabetes: Secondary | ICD-10-CM | POA: Diagnosis not present

## 2018-03-25 LAB — POCT URINALYSIS DIP (MANUAL ENTRY)
BILIRUBIN UA: NEGATIVE
Blood, UA: NEGATIVE
GLUCOSE UA: NEGATIVE mg/dL
Ketones, POC UA: NEGATIVE mg/dL
Leukocytes, UA: NEGATIVE
NITRITE UA: NEGATIVE
Protein Ur, POC: NEGATIVE mg/dL
Spec Grav, UA: <=1.005 — AB (ref 1.010–1.025)
Urobilinogen, UA: 0.2 E.U./dL
pH, UA: 6 (ref 5.0–8.0)

## 2018-03-25 MED ORDER — ENALAPRIL MALEATE 20 MG PO TABS
20.0000 mg | ORAL_TABLET | Freq: Every day | ORAL | 3 refills | Status: DC
Start: 2018-03-25 — End: 2019-06-28

## 2018-03-25 NOTE — Patient Instructions (Addendum)
We recommend that you schedule a mammogram for breast cancer screening. Typically, you do not need a referral to do this. Please contact a local imaging center to schedule your mammogram.  [facility] - [phone]  *ask for the Radiology Department [facility]) - [phone] or [phone]  [facility] - [phone] [facility] - [phone] [facility] - [phone]  *ask for the Radiology Department [facility] - [phone]  *ask for the Radiology Department [facility] - [phone]  *ask for the Mammography Department [facility] - [phone]   IF you received an x-ray today, you will receive an invoice from [facility]. Please contact [facility] at [phone] with questions or concerns regarding your invoice.   IF you received labwork today, you will receive an invoice from [facility]. Please contact [facility] at [phone] with questions or concerns regarding your invoice.   Our billing staff will not be able to assist you with questions regarding bills from these companies.  You will be contacted with the lab results as soon as they are available. The fastest way to get your results is to activate your My Chart account. Instructions are located on the last page of this paperwork. If you have not heard from [place] regarding the results in 2 weeks, please contact this office.     Managing Your Hypertension Hypertension is commonly called high blood pressure. This is when the force of your blood pressing against the walls of your arteries is too strong. Arteries are blood vessels that carry blood from your heart throughout your body. Hypertension forces the heart to work harder to pump blood, and may cause the arteries to become narrow or stiff. Having untreated or uncontrolled hypertension can cause heart attack, stroke, kidney disease, and other  problems. What are blood pressure readings? A blood pressure reading consists of a higher number over a lower number. Ideally, your blood pressure should be below 120/80. The first ("top") number is called the systolic pressure. It is a measure of the pressure in your arteries as your heart beats. The second ("bottom") number is called the diastolic pressure. It is a measure of the pressure in your arteries as the heart relaxes. What does my blood pressure reading mean? Blood pressure is classified into four stages. Based on your blood pressure reading, your health care provider may use the following stages to determine what type of treatment you need, if any. Systolic pressure and diastolic pressure are measured in a unit called mm Hg. Normal  Systolic pressure: below 120.  Diastolic pressure: below 80. Elevated  Systolic pressure: 120-129.  Diastolic pressure: below 80. Hypertension stage 1  Systolic pressure: 130-139.  Diastolic pressure: 80-89. Hypertension stage 2  Systolic pressure: 140 or above.  Diastolic pressure: 90 or above. What health risks are associated with hypertension? Managing your hypertension is an important responsibility. Uncontrolled hypertension can lead to:  A heart attack.  A stroke.  A weakened blood vessel (aneurysm).  Heart failure.  Kidney damage.  Eye damage.  Metabolic syndrome.  Memory and concentration problems.  What changes can I make to manage my hypertension? Hypertension can be managed by making lifestyle changes and possibly by taking medicines. Your health care provider will help you make a plan to bring your blood pressure within a normal range. Eating and drinking  Eat a diet that is high in fiber and potassium, and low in salt (sodium),  added sugar, and fat. An example eating plan is called the DASH (Dietary Approaches to Stop Hypertension) diet. To eat this way: ? Eat plenty of fresh fruits and vegetables. Try to fill half  of your plate at each meal with fruits and vegetables. ? Eat whole grains, such as whole wheat pasta, brown rice, or whole grain bread. Fill about one quarter of your plate with whole grains. ? Eat low-fat diary products. ? Avoid fatty cuts of meat, processed or cured meats, and poultry with skin. Fill about one quarter of your plate with lean proteins such as fish, chicken without skin, beans, eggs, and tofu. ? Avoid premade and processed foods. These tend to be higher in sodium, added sugar, and fat.  Reduce your daily sodium intake. Most people with hypertension should eat less than 1,500 mg of sodium a day.  Limit alcohol intake to no more than 1 drink a day for nonpregnant women and 2 drinks a day for men. One drink equals 12 oz of beer, 5 oz of wine, or 1 oz of hard liquor. Lifestyle  Work with your health care provider to maintain a healthy body weight, or to lose weight. Ask what an ideal weight is for you.  Get at least 30 minutes of exercise that causes your heart to beat faster (aerobic exercise) most days of the week. Activities may include walking, swimming, or biking.  Include exercise to strengthen your muscles (resistance exercise), such as weight lifting, as part of your weekly exercise routine. Try to do these types of exercises for 30 minutes at least 3 days a week.  Do not use any products that contain nicotine or tobacco, such as cigarettes and e-cigarettes. If you need help quitting, ask your health care provider.  Control any long-term (chronic) conditions you have, such as high cholesterol or diabetes. Monitoring  Monitor your blood pressure at home as told by your health care provider. Your personal target blood pressure may vary depending on your medical conditions, your age, and other factors.  Have your blood pressure checked regularly, as often as told by your health care provider. Working with your health care provider  Review all the medicines you take with  your health care provider because there may be side effects or interactions.  Talk with your health care provider about your diet, exercise habits, and other lifestyle factors that may be contributing to hypertension.  Visit your health care provider regularly. Your health care provider can help you create and adjust your plan for managing hypertension. Will I need medicine to control my blood pressure? Your health care provider may prescribe medicine if lifestyle changes are not enough to get your blood pressure under control, and if:  Your systolic blood pressure is 130 or higher.  Your diastolic blood pressure is 80 or higher.  Take medicines only as told by your health care provider. Follow the directions carefully. Blood pressure medicines must be taken as prescribed. The medicine does not work as well when you skip doses. Skipping doses also puts you at risk for problems. Contact a health care provider if:  You think you are having a reaction to medicines you have taken.  You have repeated (recurrent) headaches.  You feel dizzy.  You have swelling in your ankles.  You have trouble with your vision. Get help right away if:  You develop a severe headache or confusion.  You have unusual weakness or numbness, or you feel faint.  You have severe pain in  your chest or abdomen.  You vomit repeatedly.  You have trouble breathing. Summary  Hypertension is when the force of blood pumping through your arteries is too strong. If this condition is not controlled, it may put you at risk for serious complications.  Your personal target blood pressure may vary depending on your medical conditions, your age, and other factors. For most people, a normal blood pressure is less than 120/80.  Hypertension is managed by lifestyle changes, medicines, or both. Lifestyle changes include weight loss, eating a healthy, low-sodium diet, exercising more, and limiting alcohol. This information is  not intended to replace advice given to you by your health care provider. Make sure you discuss any questions you have with your health care provider. Document Released: 07/27/2012 Document Revised: 09/30/2016 Document Reviewed: 09/30/2016 Elsevier Interactive Patient Education  2018 [employer].   Keeping You Healthy  Get These Tests  Blood Pressure- Have your blood pressure checked by your healthcare provider at least once a year.  Normal blood pressure is 120/80.  Weight- Have your body mass index (BMI) calculated to screen for obesity.  BMI is a measure of body fat based on height and weight.  You can calculate your own BMI at https://www.west-esparza.com/  Cholesterol- Have your cholesterol checked every year.  Diabetes- Have your blood sugar checked every year if you have high blood pressure, high cholesterol, a family history of diabetes or if you are overweight.  Pap Test - Have a pap test every 1 to 5 years if you have been sexually active.  If you are older than 65 and recent pap tests have been normal you may not need additional pap tests.  In addition, if you have had a hysterectomy  for benign disease additional pap tests are not necessary.  Mammogram-Yearly mammograms are essential for early detection of breast cancer  Screening for Colon Cancer- Colonoscopy starting at age 55. Screening may begin sooner depending on your family history and other health conditions.  Follow up colonoscopy as directed by your Gastroenterologist.  Screening for Osteoporosis- Screening begins at age 68 with bone density scanning, sooner if you are at higher risk for developing Osteoporosis.  Get these medicines  Calcium with Vitamin D- Your body requires 1200-1500 mg of Calcium a day and [phone] IU of Vitamin D a day.  You can only absorb 500 mg of Calcium at a time therefore Calcium must be taken in 2 or 3 separate doses throughout the day.  Hormones- Hormone therapy has been associated with  increased risk for certain cancers and heart disease.  Talk to your healthcare provider about if you need relief from menopausal symptoms.  Aspirin- Ask your healthcare provider about taking Aspirin to prevent Heart Disease and Stroke.  Get these Immuniztions  Flu shot- Every fall  Pneumonia shot- Once after the age of 46; if you are younger ask your healthcare provider if you need a pneumonia shot.  Tetanus- Every ten years.  Zostavax- Once after the age of 24 to prevent shingles.  Take these steps  Don't smoke- Your healthcare provider can help you quit. For tips on how to quit, ask your healthcare provider or go to www.smokefree.gov or call 1-800 QUIT-NOW.  Be physically active- Exercise 5 days a week for a minimum of 30 minutes.  If you are not already physically active, start slow and gradually work up to 30 minutes of moderate physical activity.  Try walking, dancing, bike riding, swimming, etc.  Eat a healthy diet- Eat  a variety of healthy foods such as fruits, vegetables, whole grains, low fat milk, low fat cheeses, yogurt, lean meats, chicken, fish, eggs, dried beans, tofu, etc.  For more information go to www.thenutritionsource.org  Dental visit- Brush and floss teeth twice daily; visit your dentist twice a year.  Eye exam- Visit your Optometrist or Ophthalmologist yearly.  Drink alcohol in moderation- Limit alcohol intake to one drink or less a day.  Never drink and drive.  Depression- Your emotional health is as important as your physical health.  If you're feeling down or losing interest in things you normally enjoy, please talk to your healthcare provider.  Seat Belts- can save your life; always wear one  Smoke/Carbon Monoxide detectors- These detectors need to be installed on the appropriate level of your home.  Replace batteries at least once a year.  Violence- If anyone is threatening or hurting you, please tell your healthcare provider.  Living Will/ Health care  power of attorney- Discuss with your healthcare provider and family.

## 2018-03-25 NOTE — Progress Notes (Addendum)
Subjective:  By signing my name below, I, [name], attest that this documentation has been prepared under the direction and in the presence of Norberto Sorenson, MD Electronically Signed: Charline Bills, ED Scribe 03/25/2018 at 4:57 PM.   Patient ID: [name], female    DOB: [birth year], 57 y.o.   MRN: [id number]  Chief Complaint  Patient presents with  . Medication Refill    Enalapril 20 MG, Pt taking half a pill per [facility].   HPI [name] is a 57 y.o. female who presents to Primary Care at [facility] for f/u on lab work and medication refill. Normal CMP 9 months prior at [facility]. Pt is not fasting at this visit. She has been taking enalapril 20 mg qd. Pt has not been checking her BP at home. Denies cough, lightheadedness, dizziness with position change.  Hypothyroidism Pt denies any symptoms suggesting that her thyroid is too high.  Health Maintenance She has not had a mammogram but plans to schedule. Pt states [employer] sent her a letter stating they would not cover cologuard since it was considered diagnostic and not preventative. No family h/o CA.  Past Medical History:  Diagnosis Date  . Allergy    dust mite allergies; Zyrtec.  [name] Blood transfusion without reported diagnosis    post-operatively in 20s after L1 fracture with spinal cord injury after horseback riding.  . Chronic kidney disease    Tuberous Sclerosis; [facility] at [facility].  [name] Hyperlipidemia   . Hypertension   . Thyroid disease    Current Outpatient Medications on File Prior to Visit  Medication Sig Dispense Refill  . cholecalciferol (VITAMIN D) 1000 units tablet Take 1,000 Units by mouth daily.    . enalapril (VASOTEC) 20 MG tablet TAKE 2 TABLETS (40 MG TOTAL) BY MOUTH DAILY. 180 tablet 3  . fluticasone (FLONASE) 50 MCG/ACT nasal spray Place 2 sprays into both nostrils daily. 16 g 6  . levothyroxine (SYNTHROID, LEVOTHROID) 112 MCG tablet Take 1 tablet (112 mcg total) by mouth daily before breakfast. 90 tablet 3   . Albuterol Sulfate (PROAIR RESPICLICK) 108 (90 Base) MCG/ACT AEPB Inhale 2 puffs into the lungs every 4 (four) hours as needed (for shortness or breath, cough, and wheeze). Use 30 minutes prior to exercise (Patient not taking: Reported on 03/25/2018) 1 each 2  . amoxicillin-clavulanate (AUGMENTIN) 875-125 MG tablet Take 1 tablet by mouth 2 (two) times daily. (Patient not taking: Reported on 03/25/2018) 20 tablet 0  . cetirizine (ZYRTEC) 10 MG tablet Take 5 mg by mouth daily.      No current facility-administered medications on file prior to visit.     Past Surgical History:  Procedure Laterality Date  . ABDOMINAL HYSTERECTOMY     DUB; ovaries remaining.  [name] CESAREAN SECTION    . Sleep Study  12/17/2010   No sleep disordered breathing or periodic limb mvmets.  Excess light sleep.  [name] SPINE SURGERY     L1 fracture with spinal cord injury with horseback riding.  . TUBAL LIGATION      Allergies  Allergen Reactions  . Sulfa Antibiotics Hives  . Lipitor [Atorvastatin] Other (See Comments)    Fatigue and confusion   Family History  Problem Relation Age of Onset  . Thyroid disease Mother   . Arthritis Mother   . Asthma Mother   . Migraines Mother   . Heart disease Father 41       AMI age 77; CABG at 49.  . Arthritis  Father    Social History   Socioeconomic History  . Marital status: Married    Spouse name: Not on file  . Number of children: Not on file  . Years of education: Not on file  . Highest education level: Not on file  Occupational History  . Not on file  Social Needs  . Financial resource strain: Not on file  . Food insecurity:    Worry: Not on file    Inability: Not on file  . Transportation needs:    Medical: Not on file    Non-medical: Not on file  Tobacco Use  . Smoking status: Never Smoker  . Smokeless tobacco: Never Used  Substance and Sexual Activity  . Alcohol use: Yes    Comment: occ  . Drug use: No  . Sexual activity: Yes    Birth  control/protection: None  Lifestyle  . Physical activity:    Days per week: Not on file    Minutes per session: Not on file  . Stress: Not on file  Relationships  . Social connections:    Talks on phone: Not on file    Gets together: Not on file    Attends religious service: Not on file    Active member of club or organization: Not on file    Attends meetings of clubs or organizations: Not on file    Relationship status: Not on file  Other Topics Concern  . Not on file  Social History Narrative   Marital status: married x 20+ years; happily married.      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      Tobacco; none      Alcohol:  Socially      Drugs: none      Exercise: sporadic   Depression screen [facility] 2/9 03/25/2018 11/18/2017 06/14/2017 10/22/2016 10/21/2016  Decreased Interest 0 0 0 0 0  Down, Depressed, Hopeless 0 0 0 0 0  PHQ - 2 Score 0 0 0 0 0     Review of Systems  Constitutional: Negative for fatigue and unexpected weight change.  Respiratory: Negative for cough.   Endocrine: Negative for cold intolerance and heat intolerance.  Skin: Negative for color change.  Neurological: Negative for dizziness and light-headedness.      Objective:   Physical Exam  Constitutional: She is oriented to person, place, and time. She appears well-developed and well-nourished. No distress.  HENT:  Head: Normocephalic and atraumatic.  Right Ear: Tympanic membrane normal.  Left Ear: Tympanic membrane normal.  Eyes: Conjunctivae and EOM are normal.  Neck: Neck supple. No tracheal deviation present.  Cardiovascular: Normal rate and regular rhythm.  Pulmonary/Chest: Effort normal and breath sounds normal. No respiratory distress.  Musculoskeletal: Normal range of motion.  Neurological: She is alert and oriented to person, place, and time.  Skin: Skin is warm and dry.  Psychiatric: She has a normal  mood and affect. Her behavior is normal.  Nursing note and vitals reviewed.  BP 108/74 (BP Location: Left Arm, Patient Position: Sitting, Cuff Size: Normal)   Pulse 66   Temp 98 F (36.7 C) (Oral)   Resp 18   Ht 5' 0.5" (1.537 m)   Wt 161 lb 12.8 oz (73.4 kg)   SpO2 98%   BMI 31.08 kg/m     EKG: NSR, no acute ischemic changes noted. No  significant change noted when compared to prior EKG done 03/02/13.   I have personally reviewed the EKG tracing and agree with the computer interpretation: Sinus  Rhythm  -Left atrial enlargement.  BORDERLINE Assessment & Plan:   1. Annual physical exam   2. Essential hypertension - BP excellent- cont enalapril  as no sxs of hypotension. Pt requests to leave sig and mg tab size the same so that one fill lasts her twice as long.  3. Acquired hypothyroidism - on levthyroxine 112 - recheck w/ next labs  4. Mixed hyperlipidemia - not fasting today so will RTC for lab only  5. Prediabetes     Orders Placed This Encounter  Procedures  . Lipid panel    Standing Status:   Future    Standing Expiration Date:   03/26/2019    Order Specific Question:   Has the patient fasted?    Answer:   Yes  . Comprehensive metabolic panel    Standing Status:   Future    Standing Expiration Date:   03/26/2019    Order Specific Question:   Has the patient fasted?    Answer:   Yes  . TSH    Standing Status:   Future    Standing Expiration Date:   03/26/2019  . Hemoglobin A1c    Standing Status:   Future    Standing Expiration Date:   03/26/2019  . CBC    Standing Status:   Future    Standing Expiration Date:   03/26/2019  . POCT urinalysis dipstick  . EKG 12-Lead    Meds ordered this encounter  Medications  . enalapril (VASOTEC) 20 MG tablet    Sig: Take 1 tablet (20 mg total) by mouth daily.    Dispense:  90 tablet    Refill:  3    I personally performed the services described in this documentation, which was scribed in my presence. The recorded  information has been reviewed and considered, and addended by me as needed.   Norberto Sorenson, M.D.  Primary Care at [facility] [address] [place], [place] [address] [phone] phone [phone] fax  03/25/18 6:15 PM

## 2018-09-09 ENCOUNTER — Other Ambulatory Visit: Payer: Self-pay

## 2018-09-09 ENCOUNTER — Encounter: Payer: Self-pay | Admitting: Family Medicine

## 2018-09-09 ENCOUNTER — Ambulatory Visit: Payer: BC Managed Care – PPO | Admitting: Family Medicine

## 2018-09-09 VITALS — BP 116/74 | HR 80 | Temp 98.3°F | Resp 16 | Ht 60.0 in | Wt 174.0 lb

## 2018-09-09 DIAGNOSIS — E782 Mixed hyperlipidemia: Secondary | ICD-10-CM

## 2018-09-09 DIAGNOSIS — M25571 Pain in right ankle and joints of right foot: Secondary | ICD-10-CM

## 2018-09-09 DIAGNOSIS — E039 Hypothyroidism, unspecified: Secondary | ICD-10-CM | POA: Diagnosis not present

## 2018-09-09 DIAGNOSIS — Z23 Encounter for immunization: Secondary | ICD-10-CM | POA: Diagnosis not present

## 2018-09-09 DIAGNOSIS — I10 Essential (primary) hypertension: Secondary | ICD-10-CM

## 2018-09-09 DIAGNOSIS — R7303 Prediabetes: Secondary | ICD-10-CM

## 2018-09-09 MED ORDER — LEVOTHYROXINE SODIUM 112 MCG PO TABS: 112.0000 ug | ORAL_TABLET | Freq: Every day | ORAL | 1 refills | Status: DC

## 2018-09-09 MED ORDER — MELOXICAM 7.5 MG PO TABS: 7.5000 mg | ORAL_TABLET | Freq: Every day | ORAL | 0 refills | Status: DC | PRN

## 2018-09-09 NOTE — Patient Instructions (Addendum)
Ankle pain may be a component of Achilles tendinitis and retrocalcaneal bursitis.  Try the anti-inflammatory once per day, avoid any constrictive shoes that press on the back of the Achilles area, and over-the-counter heel lift or insert for the heel on the right side temporarily may be helpful.  If any ncreasing redness, or worsening symptoms please return for recheck.  Otherwise I expect the area to improve in the next week to 10 days at the most.  I will check the thyroid test today along with other labs ordered.  Try to incorporate some form of exercise into day -even 5-10 minutes in the morning may be helpful once ankle improves.   Return to the clinic or go to the nearest emergency room if any of your symptoms worsen or new symptoms occur.  If you have lab work done today you will be contacted with your lab results within the next 2 weeks.  If you have not heard from [place] then please contact us. The fastest way to get your results is to register for My Chart.   IF you received an x-ray today, you will receive an invoice from [facility]. Please contact [facility] at [phone] with questions or concerns regarding your invoice.   IF you received labwork today, you will receive an invoice from [place]. Please contact [facility] at [phone] with questions or concerns regarding your invoice.   Our billing staff will not be able to assist you with questions regarding bills from these companies.  You will be contacted with the lab results as soon as they are available. The fastest way to get your results is to activate your My Chart account. Instructions are located on the last page of this paperwork. If you have not heard from [place] regarding the results in 2 weeks, please contact this office.

## 2018-09-09 NOTE — Progress Notes (Signed)
Subjective:  By signing my name below, I, [name], attest that this documentation has been prepared under the direction and in the presence of [name], MD Electronically Signed: Charline Bills, ED Scribe 09/09/2018 at 9:45 AM.   Patient ID: [name], female    DOB: 1961-04-12, 57 y.o.   MRN: [id number]  Chief Complaint  Patient presents with  . Ankle    right, " I woke up on Tuesday night, and it was hurting, I don't know what I did to it."  . Hypothyroidism    per pt I need my medication, but I think I need bloodwork. she has a week's worth of med left.  . Immunizations    flu shot   HPI [name] is a 57 y.o. female who presents to Primary Care at [facility] for f/u. Pt is fasting at this time.  R Ankle Pain Pt reports gradually worsening R ankle pain x 3 nights ago. She does report wearing a new pair of shoes 3 days ago. Also reports increased pain with lifting her toes. No known injury. She has tried asa with temporary relief. Denies fever, chills, h/o gout.  Hypothyroidism Lab Results  Component Value Date   TSH 1.360 09/16/2017  Plan for fasting lab visit when seen in May by Dr. Clelia Croft but hasn't had lab work drawn. Continued on Synthroid 112 mcg qd. - Pt does report some weight gain since May which she attributes to not exercising since returning back to teaching at [employer]. Denies changes in hair, skin, nails, heat/cold intolerance.  Wt Readings from Last 3 Encounters:  09/09/18 174 lb (78.9 kg)  03/25/18 161 lb 12.8 oz (73.4 kg)  11/18/17 160 lb (72.6 kg)    Patient Active Problem List   Diagnosis Date Noted  . Prediabetes 06/09/2017  . 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 Hyperlipidemia 10/21/2016  . Obesity 02/15/2014  . 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 Hypothyroid 06/10/2012  . HTN (hypertension) 06/10/2012   Past Medical History:  Diagnosis Date  . Allergy    dust mite allergies;  Zyrtec.  [name] Blood transfusion without reported diagnosis    post-operatively in 20s after L1 fracture with spinal cord injury after horseback riding.  . Chronic kidney disease    Tuberous Sclerosis; [facility] at [facility].  [name] Hyperlipidemia   . Hypertension   . Thyroid disease    Past Surgical History:  Procedure Laterality Date  . ABDOMINAL HYSTERECTOMY     DUB; ovaries remaining.  [name] CESAREAN SECTION    . Sleep Study  12/17/2010   No sleep disordered breathing or periodic limb mvmets.  Excess light sleep.  [name] SPINE SURGERY     L1 fracture with spinal cord injury with horseback riding.  . TUBAL LIGATION     Allergies  Allergen Reactions  . Sulfa Antibiotics Hives  . Lipitor [Atorvastatin] Other (See Comments)    Fatigue and confusion   Prior to Admission medications   Medication Sig Start Date End Date Taking? Authorizing Provider  enalapril (VASOTEC) 20 MG tablet Take 1 tablet (20 mg total) by mouth daily. 03/25/18  Yes Sherren Mocha, MD  levothyroxine (SYNTHROID, LEVOTHROID) 112 MCG tablet Take 1 tablet (112 mcg total) by mouth daily before breakfast. 09/17/17  Yes Sherren Mocha, MD  Albuterol Sulfate (PROAIR RESPICLICK) 108 (90 Base) MCG/ACT AEPB Inhale 2 puffs into the lungs every 4 (four) hours as needed (for shortness or breath,  cough, and wheeze). Use 30 minutes prior to exercise Patient not taking: Reported on 03/25/2018 10/22/16   Sherren Mocha, MD  cetirizine (ZYRTEC) 10 MG tablet Take 5 mg by mouth daily.     [provider]  cholecalciferol (VITAMIN D) 1000 units tablet Take 1,000 Units by mouth daily.    [provider]  fluticasone (FLONASE) 50 MCG/ACT nasal spray Place 2 sprays into both nostrils daily. Patient not taking: Reported on 09/09/2018 10/06/16   Magdalene River, PA-C   Social History   Socioeconomic History  . Marital status: Married    Spouse name: Not on file  . Number of children: Not on file  . Years of education: Not on file  . Highest  education level: Not on file  Occupational History  . Not on file  Social Needs  . Financial resource strain: Not on file  . Food insecurity:    Worry: Not on file    Inability: Not on file  . Transportation needs:    Medical: Not on file    Non-medical: Not on file  Tobacco Use  . Smoking status: Never Smoker  . Smokeless tobacco: Never Used  Substance and Sexual Activity  . Alcohol use: Yes    Comment: occ  . Drug use: No  . Sexual activity: Yes    Birth control/protection: None  Lifestyle  . Physical activity:    Days per week: Not on file    Minutes per session: Not on file  . Stress: Not on file  Relationships  . Social connections:    Talks on phone: Not on file    Gets together: Not on file    Attends religious service: Not on file    Active member of club or organization: Not on file    Attends meetings of clubs or organizations: Not on file    Relationship status: Not on file  . Intimate partner violence:    Fear of current or ex partner: Not on file    Emotionally abused: Not on file    Physically abused: Not on file    Forced sexual activity: Not on file  Other Topics Concern  . Not on file  Social History Narrative   Marital status: married x 20+ years; happily married.      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      Tobacco; none      Alcohol:  Socially      Drugs: none      Exercise: sporadic   Review of Systems  Constitutional: Negative for chills and fever.  Endocrine: Negative for cold intolerance and heat intolerance.  Musculoskeletal: Positive for arthralgias.  Skin: Negative for color change.      Objective:   Physical Exam  Constitutional: She is oriented to person, place, and time. She appears well-developed and well-nourished. No distress.  HENT:  Head: Normocephalic and atraumatic.  Eyes: Conjunctivae and EOM are normal.  Neck: Neck supple.  No tracheal deviation present. No thyroid mass and no thyromegaly present.  Cardiovascular: Normal rate.  Pulmonary/Chest: Effort normal. No respiratory distress.  Musculoskeletal: Normal range of motion.  Warmth, slight erythema ~4 cm of height of erythema on medial aspect, lateral aspect is normal, faint erythema 3 cm on posterior R ankle. Tender to distal achilles. Malleoli are nontender. Foot is nontender. Pain with dorsiflexion of  foot at achilles. I do not feel a defect. Tender at retrocalcaneal bursa as well. Neg Thompson. Calf is nontender. No focal swelling.  Neurological: She is alert and oriented to person, place, and time.  Skin: Skin is warm and dry.  Psychiatric: She has a normal mood and affect. Her behavior is normal.  Nursing note and vitals reviewed.  Vitals:   09/09/18 0917  BP: 116/74  Pulse: 80  Resp: 16  Temp: 98.3 F (36.8 C)  TempSrc: Oral  SpO2: 100%  Weight: 174 lb (78.9 kg)  Height: 5' (1.524 m)      Assessment & Plan:   ZAMAYA RAPAPORT is a 57 y.o. female Acute right ankle pain - Plan: meloxicam (MOBIC) 7.5 MG tablet  -Based on location, symptoms, suspicious for retrocalcaneal bursitis with new shoe wear, possible component of Achilles tendinosis.  Less likely infectious as skin intact.  -Symptomatic care with meloxicam, relative rest, avoid direct compression of the area, temporary heel lift, RTC precautions  Flu vaccine need- given.   Essential hypertension - Plan: CBC, Comprehensive metabolic panel  -Stable, prior labs ordered.  Acquired hypothyroidism - Plan: CBC, TSH Hypothyroidism, unspecified type - Plan: levothyroxine (SYNTHROID, LEVOTHROID) 112 MCG tablet  - Stable, tolerating current regimen. Medications refilled. Labs pending as above.   Mixed hyperlipidemia - Plan: CBC, Comprehensive metabolic panel, Lipid panel Prediabetes - Plan: CBC, Hemoglobin A1c, Comprehensive metabolic panel  -Previous lab only order drawn.   Meds ordered  this encounter  Medications  . levothyroxine (SYNTHROID, LEVOTHROID) 112 MCG tablet    Sig: Take 1 tablet (112 mcg total) by mouth daily before breakfast.    Dispense:  90 tablet    Refill:  1  . meloxicam (MOBIC) 7.5 MG tablet    Sig: Take 1 tablet (7.5 mg total) by mouth daily as needed for pain.    Dispense:  30 tablet    Refill:  0   Patient Instructions   Ankle pain may be a component of Achilles tendinitis and retrocalcaneal bursitis.  Try the anti-inflammatory once per day, avoid any constrictive shoes that press on the back of the Achilles area, and over-the-counter heel lift or insert for the heel on the right side temporarily may be helpful.  If any ncreasing redness, or worsening symptoms please return for recheck.  Otherwise I expect the area to improve in the next week to 10 days at the most.  I will check the thyroid test today along with other labs ordered.  Try to incorporate some form of exercise into day -even 5-10 minutes in the morning may be helpful once ankle improves.   Return to the clinic or go to the nearest emergency room if any of your symptoms worsen or new symptoms occur.  If you have lab work done today you will be contacted with your lab results within the next 2 weeks.  If you have not heard from [place] then please contact us. The fastest way to get your results is to register for My Chart.   IF you received an x-ray today, you will receive an invoice from [facility]. Please contact [facility] at [phone] with questions or concerns regarding your invoice.   IF you received labwork today, you will receive an invoice from [place]. Please contact [facility] at [phone] with questions or concerns regarding your invoice.   Our billing staff will not be able to assist you with questions regarding bills from these companies.  You will be contacted with the  lab results as soon as they are available. The fastest way to get your results is  to activate your My Chart account. Instructions are located on the last page of this paperwork. If you have not heard from [place] regarding the results in 2 weeks, please contact this office.      Signed,   Meredith Staggers, MD Primary Care at [facility].  09/09/18 10:22 PM

## 2018-09-10 LAB — LIPID PANEL
CHOL/HDL RATIO: 3.6 ratio (ref 0.0–4.4)
Cholesterol, Total: 266 mg/dL — ABNORMAL HIGH (ref 100–199)
HDL: 73 mg/dL (ref >39)
LDL Calculated: 172 mg/dL — ABNORMAL HIGH (ref 0–99)
Triglycerides: 106 mg/dL (ref 0–149)
VLDL Cholesterol Cal: 21 mg/dL (ref 5–40)

## 2018-09-10 LAB — CBC
HEMOGLOBIN: 15.8 g/dL (ref 11.1–15.9)
Hematocrit: 45 % (ref 34.0–46.6)
MCH: 33.3 pg — AB (ref 26.6–33.0)
MCHC: 35.1 g/dL (ref 31.5–35.7)
MCV: 95 fL (ref 79–97)
Platelets: 330 10*3/uL (ref 150–450)
RBC: 4.74 x10E6/uL (ref 3.77–5.28)
RDW: 12.4 % (ref 12.3–15.4)
WBC: 7.1 10*3/uL (ref 3.4–10.8)

## 2018-09-10 LAB — COMPREHENSIVE METABOLIC PANEL
A/G RATIO: 2.1 (ref 1.2–2.2)
ALBUMIN: 5 g/dL (ref 3.5–5.5)
ALT: 31 IU/L (ref 0–32)
AST: 23 IU/L (ref 0–40)
Alkaline Phosphatase: 70 IU/L (ref 39–117)
BUN / CREAT RATIO: 28 — AB (ref 9–23)
BUN: 19 mg/dL (ref 6–24)
Bilirubin Total: 0.4 mg/dL (ref 0.0–1.2)
CO2: 19 mmol/L — ABNORMAL LOW (ref 20–29)
Calcium: 9.9 mg/dL (ref 8.7–10.2)
Chloride: 101 mmol/L (ref 96–106)
Creatinine, Ser: 0.68 mg/dL (ref 0.57–1.00)
GFR, EST AFRICAN AMERICAN: 112 mL/min/{1.73_m2} (ref >59)
GFR, EST NON AFRICAN AMERICAN: 97 mL/min/{1.73_m2} (ref >59)
GLOBULIN, TOTAL: 2.4 g/dL (ref 1.5–4.5)
Glucose: 94 mg/dL (ref 65–99)
POTASSIUM: 4.6 mmol/L (ref 3.5–5.2)
SODIUM: 142 mmol/L (ref 134–144)
TOTAL PROTEIN: 7.4 g/dL (ref 6.0–8.5)

## 2018-09-10 LAB — TSH: TSH: 1.23 u[IU]/mL (ref 0.450–4.500)

## 2018-09-10 LAB — HEMOGLOBIN A1C
ESTIMATED AVERAGE GLUCOSE: 114 mg/dL
Hgb A1c MFr Bld: 5.6 % (ref 4.8–5.6)

## 2019-01-29 ENCOUNTER — Telehealth: Payer: Self-pay | Admitting: Family Medicine

## 2019-01-29 DIAGNOSIS — E039 Hypothyroidism, unspecified: Secondary | ICD-10-CM

## 2019-01-31 ENCOUNTER — Other Ambulatory Visit: Payer: Self-pay

## 2019-01-31 DIAGNOSIS — E039 Hypothyroidism, unspecified: Secondary | ICD-10-CM

## 2019-01-31 MED ORDER — LEVOTHYROXINE SODIUM 112 MCG PO TABS: 112.0000 ug | ORAL_TABLET | Freq: Every day | ORAL | 0 refills | Status: DC

## 2019-01-31 NOTE — Telephone Encounter (Signed)
Sent to pharmacy 

## 2019-01-31 NOTE — Telephone Encounter (Signed)
Pt calling in.  States she requested this because CDC has recommended that you have a 90 day supply of medication on hand. Pt states she is not sick and she doesn't want to come in for a visit and knows she is requesting this early.

## 2019-03-15 ENCOUNTER — Ambulatory Visit: Payer: Self-pay

## 2019-03-15 NOTE — Telephone Encounter (Signed)
Pt called asking for a doctors order to have a test for Covid-19 antibodies. Pt stated that she heard on the new and looked up on [facility] web site that they are doing test.  Pt is sure she had coronavirus and and wants to make sure she is ok to be with her mother. Pt advised that note will be sent to office for provider to review.  Reason for Disposition . COVID-19 Testing, questions about  Protocols used: CORONAVIRUS (COVID-19) DIAGNOSED OR SUSPECTED-A-AH

## 2019-03-17 NOTE — Telephone Encounter (Signed)
Spoke with patient.

## 2019-06-15 ENCOUNTER — Other Ambulatory Visit: Payer: Self-pay | Admitting: Family Medicine

## 2019-06-15 DIAGNOSIS — E039 Hypothyroidism, unspecified: Secondary | ICD-10-CM

## 2019-06-15 NOTE — Telephone Encounter (Signed)
Requested medication (s) are due for refill today:   Yes  Requested medication (s) are on the active medication list:   Yes  Future visit scheduled:   No.  Been a year since last CPE   Last ordered: 01/31/2019   #90  0 refills by Dr. Carlota Raspberry  Requested Prescriptions  Pending Prescriptions Disp Refills   levothyroxine (SYNTHROID) 112 MCG tablet [Pharmacy Med Name: LEVOTHYROXINE 112 MCG TABLET] 90 tablet 0    Sig: Take 1 tablet (112 mcg total) by mouth daily before breakfast.     Endocrinology:  Hypothyroid Agents Failed - 06/15/2019  9:48 AM      Failed - TSH needs to be rechecked within 3 months after an abnormal result. Refill until TSH is due.      Passed - TSH in normal range and within 360 days    TSH  Date Value Ref Range Status  09/09/2018 1.230 0.450 - 4.500 uIU/mL Final         Passed - Valid encounter within last 12 months    Recent Outpatient Visits          9 months ago Acute right ankle pain   [facility], Ranell Patrick, MD   1 year ago Annual physical exam   [facility], Laurey Arrow, MD   1 year ago Acute non-recurrent maxillary sinusitis   [facility], Laurey Arrow, MD   1 year ago Hypothyroidism, unspecified type   [facility] at [facility], Ranell Patrick, MD   2 years ago Hypothyroidism, unspecified type   Primary Care at [facility], Laurey Arrow, MD

## 2019-06-28 ENCOUNTER — Other Ambulatory Visit: Payer: Self-pay

## 2019-06-28 ENCOUNTER — Encounter: Payer: Self-pay | Admitting: Family Medicine

## 2019-06-28 ENCOUNTER — Ambulatory Visit: Payer: BC Managed Care – PPO | Admitting: Family Medicine

## 2019-06-28 VITALS — BP 148/89 | HR 87 | Temp 96.0°F | Resp 16 | Wt 182.2 lb

## 2019-06-28 DIAGNOSIS — E039 Hypothyroidism, unspecified: Secondary | ICD-10-CM | POA: Diagnosis not present

## 2019-06-28 DIAGNOSIS — I10 Essential (primary) hypertension: Secondary | ICD-10-CM | POA: Diagnosis not present

## 2019-06-28 DIAGNOSIS — Z23 Encounter for immunization: Secondary | ICD-10-CM

## 2019-06-28 DIAGNOSIS — E782 Mixed hyperlipidemia: Secondary | ICD-10-CM | POA: Diagnosis not present

## 2019-06-28 MED ORDER — ENALAPRIL MALEATE 20 MG PO TABS: 20.0000 mg | ORAL_TABLET | Freq: Every day | ORAL | 3 refills | Status: DC

## 2019-06-28 MED ORDER — LEVOTHYROXINE SODIUM 112 MCG PO TABS: 112.0000 ug | ORAL_TABLET | Freq: Every day | ORAL | 3 refills | Status: DC

## 2019-06-28 NOTE — Patient Instructions (Addendum)
No med changes for now. Keep a record of your blood pressures outside of the office and if over 140/90 - return to review med changes.   See info on stress below but if those symptoms worsen - please return to discuss further.  Depending on cholesterol reading, may need to discuss other statin. Can discuss options in person if needed.  Thanks for coming in today and stay safe.     Stress Stress is a normal reaction to life events. Stress is what you feel when life demands more than you are used to, or more than you think you can handle. Some stress can be useful, such as studying for a test or meeting a deadline at work. Stress that occurs too often or for too long can cause problems. It can affect your emotional health and interfere with relationships and normal daily activities. Too much stress can weaken your body's defense system (immune system) and increase your risk for physical illness. If you already have a medical problem, stress can make it worse. What are the causes? All sorts of life events can cause stress. An event that causes stress for one person may not be stressful for another person. Major life events, whether positive or negative, commonly cause stress. Examples include:  Losing a job or starting a new job.  Losing a loved one.  Moving to a new town or home.  Getting married or divorced.  Having a baby.  Injury or illness. Less obvious life events can also cause stress, especially if they occur day after day or in combination with each other. Examples include:  Working long hours.  Driving in traffic.  Caring for children.  Being in debt.  Being in a difficult relationship. What are the signs or symptoms? Stress can cause emotional symptoms, including:  Anxiety. This is feeling worried, afraid, on edge, overwhelmed, or out of control.  Anger, including irritation or impatience.  Depression. This is feeling sad, down, helpless, or guilty.  Trouble  focusing, remembering, or making decisions. Stress can cause physical symptoms, including:  Aches and pains. These may affect your head, neck, back, stomach, or other areas of your body.  Tight muscles or a clenched jaw.  Low energy.  Trouble sleeping. Stress can cause unhealthy behaviors, including:  Eating to feel better (overeating) or skipping meals.  Working too much or putting off tasks.  Smoking, drinking alcohol, or using drugs to feel better. How is this diagnosed? Stress is diagnosed through an assessment by your health care provider. He or she may diagnose this condition based on:  Your symptoms and any stressful life events.  Your medical history.  Tests to rule out other causes of your symptoms. Depending on your condition, your health care provider may refer you to a specialist for further evaluation. How is this treated?  Stress management techniques are the recommended treatment for stress. Medicine is not typically recommended for the treatment of stress. Techniques to reduce your reaction to stressful life events include:  Stress identification. Monitor yourself for symptoms of stress and identify what causes stress for you. These skills may help you to avoid or prepare for stressful events.  Time management. Set your priorities, keep a calendar of events, and learn to say "no." Taking these actions can help you avoid making too many commitments. Techniques for coping with stress include:  Rethinking the problem. Try to think realistically about stressful events rather than ignoring them or overreacting. Try to find the positives in a  stressful situation rather than focusing on the negatives.  Exercise. Physical exercise can release both physical and emotional tension. The key is to find a form of exercise that you enjoy and do it regularly.  Relaxation techniques. These relax the body and mind. The key is to find one or more that you enjoy and use the  technique(s) regularly. Examples include: ? Meditation, deep breathing, or progressive relaxation techniques. ? Yoga or tai chi. ? Biofeedback, mindfulness techniques, or journaling. ? Listening to music, being out in nature, or participating in other hobbies.  Practicing a healthy lifestyle. Eat a balanced diet, drink plenty of water, limit or avoid caffeine, and get plenty of sleep.  Having a strong support network. Spend time with family, friends, or other people you enjoy being around. Express your feelings and talk things over with someone you trust. Counseling or talk therapy with a mental health professional may be helpful if you are having trouble managing stress on your own. Follow these instructions at home: Lifestyle   Avoid drugs.  Do not use any products that contain nicotine or tobacco, such as cigarettes and e-cigarettes. If you need help quitting, ask your health care provider.  Limit alcohol intake to no more than 1 drink a day for nonpregnant women and 2 drinks a day for men. One drink equals 12 oz of beer, 5 oz of wine, or 1 oz of hard liquor.  Do not use alcohol or drugs to relax.  Eat a balanced diet that includes fresh fruits and vegetables, whole grains, lean meats, fish, eggs, and beans, and low-fat dairy. Avoid processed foods and foods high in added fat, sugar, and salt.  Exercise at least 30 minutes on 5 or more days each week.  Get 7-8 hours of sleep each night. General instructions   Practice stress management techniques as discussed with your health care provider.  Drink enough fluid to keep your urine clear or pale yellow.  Take over-the-counter and prescription medicines only as told by your health care provider.  Keep all follow-up visits as told by your health care provider. This is important. Contact a health care provider if:  Your symptoms get worse.  You have new symptoms.  You feel overwhelmed by your problems and can no longer manage  them on your own. Get help right away if:  You have thoughts of hurting yourself or others. If you ever feel like you may hurt yourself or others, or have thoughts about taking your own life, get help right away. You can go to your nearest emergency department or call:  Your local emergency services (911 in the [place]  A suicide crisis helpline, such as the [facility] at (262)077-2385. This is open 24 hours a day. Summary  Stress is a normal reaction to life events. It can cause problems if it happens too often or for too long.  Practicing stress management techniques is the best way to treat stress.  Counseling or talk therapy with a mental health professional may be helpful if you are having trouble managing stress on your own. This information is not intended to replace advice given to you by your health care provider. Make sure you discuss any questions you have with your health care provider. Document Released: 04/28/2001 Document Revised: 10/15/2017 Document Reviewed: 12/23/2016 Elsevier Patient Education  [employer].    If you have lab work done today you will be contacted with your lab results within the next 2 weeks.  If you have not heard from [place] then please contact us. The fastest way to get your results is to register for My Chart.   IF you received an x-ray today, you will receive an invoice from [facility]. Please contact [facility] at [phone] with questions or concerns regarding your invoice.   IF you received labwork today, you will receive an invoice from [place]. Please contact [facility] at [phone] with questions or concerns regarding your invoice.   Our billing staff will not be able to assist you with questions regarding bills from these companies.  You will be contacted with the lab results as soon as they are available. The fastest way to get your results is to activate your My Chart account.  Instructions are located on the last page of this paperwork. If you have not heard from [place] regarding the results in 2 weeks, please contact this office.

## 2019-06-28 NOTE — Progress Notes (Signed)
Subjective:    Patient ID: [name], female    DOB: 10-01-1961, 58 y.o.   MRN: [id number]  HPI [name] is a 58 y.o. female Presents today for: Chief Complaint  Patient presents with   Medication review     Patient need a refill on synthroid, enalapril   Hypertension: BP Readings from Last 3 Encounters:  06/28/19 (!) 148/89  09/09/18 116/74  03/25/18 108/74   Lab Results  Component Value Date   CREATININE 0.68 09/09/2018  enalapril 32m qd.  Ran out about a week ago.  Usual home readings under 130/90. 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  Working on computer helps manage stress.  Exercising.   Hypothyroidism: Lab Results  Component Value Date   TSH 1.230 09/09/2018  Taking medication daily.  Synthroid 1134m qd.  No new hot or cold intolerance. No new hair or skin changes, heart palpitations or new fatigue.some decreased exercise. Weight mgt in past. Trying to limit sugar, alcohol, and trying to walk in morning/swimming.  Wt Readings from Last 3 Encounters:  06/28/19 182 lb 3.2 oz (82.6 kg)  09/09/18 174 lb (78.9 kg)  03/25/18 161 lb 12.8 oz (73.4 kg)   Hyperlipidemia:  Lab Results  Component Value Date   CHOL 266 (H) 09/09/2018   HDL 73 09/09/2018   LDLCALC 172 (H) 09/09/2018   TRIG 106 09/09/2018   CHOLHDL 3.6 09/09/2018   Lab Results  Component Value Date   ALT 31 09/09/2018   AST 23 09/09/2018   ALKPHOS 70 09/09/2018   BILITOT 0.4 09/09/2018  no meds.  Foggy feeling with Lipitor. No other statins tried.   father with heart disease at older age.  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   Blood transfusion without reported diagnosis    post-operatively in 20s after L1 fracture with spinal cord injury after horseback riding.   Chronic kidney disease    [facility]  Hyperlipidemia    Hypertension    Thyroid disease    Past Surgical History:  Procedure Laterality Date   ABDOMINAL HYSTERECTOMY     DUB; ovaries remaining.   CESAREAN SECTION     Sleep Study  12/17/2010   No sleep disordered breathing or periodic limb mvmets.  Excess light sleep.   SPINE SURGERY     L1 fracture with spinal cord injury with horseback riding.   TUBAL LIGATION     Allergies  Allergen Reactions   Sulfa Antibiotics Hives   Lipitor [Atorvastatin] Other (See Comments)    Fatigue and confusion   Prior to Admission medications   Medication Sig Start Date End Date Taking? Authorizing Provider  enalapril (VASOTEC) 20 MG tablet Take 1 tablet (20 mg total) by mouth daily. 03/25/18  Yes ShShawnee KnappMD  levothyroxine (SYNTHROID) 112 MCG tablet TAKE 1 TABLET (112 MCG TOTAL) BY MOUTH DAILY BEFORE BREAKFAST. 06/15/19  Yes GrWendie AgresteMD   Social History   Socioeconomic History   Marital status: Married    Spouse name: Not on file  Number of children: Not on file   Years of education: Not on file   Highest education level: Not on file  Occupational History   Not on file  Social Needs   Financial resource strain: Not on file   Food insecurity    Worry: Not on file    Inability: Not on file   Transportation needs    Medical: Not on file    Non-medical: Not on file  Tobacco Use   Smoking status: Never Smoker   Smokeless tobacco: Never Used  Substance and Sexual Activity   Alcohol use: Yes    Comment: occ   Drug use: No   Sexual activity: Yes    Birth control/protection: None  Lifestyle   Physical activity    Days per week: Not on file    Minutes per session: Not on file    Stress: Not on file  Relationships   Social connections    Talks on phone: Not on file    Gets together: Not on file    Attends religious service: Not on file    Active member of club or organization: Not on file    Attends meetings of clubs or organizations: Not on file    Relationship status: Not on file   Intimate partner violence    Fear of current or ex partner: Not on file    Emotionally abused: Not on file    Physically abused: Not on file    Forced sexual activity: Not on file  Other Topics Concern   Not on file  Social History Narrative   Marital status: married x 20+ years; happily married.      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      Tobacco; none      Alcohol:  Socially      Drugs: none      Exercise: sporadic    Review of Systems  Constitutional: Negative for fatigue (fatigue with stress of starting school only. ) and unexpected weight change.  Respiratory: Negative for chest tightness and shortness of breath.   Cardiovascular: Negative for chest pain, palpitations and leg swelling.  Gastrointestinal: Negative for abdominal pain and blood in stool.  Neurological: Negative for dizziness, syncope, light-headedness and headaches (chronic, no new headaches. ).       Objective:   Physical Exam Vitals signs reviewed.  Constitutional:      Appearance: She is well-developed.  HENT:     Head: Normocephalic and atraumatic.  Eyes:     Conjunctiva/sclera: Conjunctivae normal.     Pupils: Pupils are equal, round, and reactive to light.  Neck:     Vascular: No carotid bruit.  Cardiovascular:     Rate and Rhythm: Normal rate and regular rhythm.     Heart sounds: Normal heart sounds.  Pulmonary:     Effort: Pulmonary effort is normal.     Breath sounds: Normal breath sounds.  Abdominal:     Palpations: Abdomen is soft. There is no pulsatile mass.     Tenderness:  There is no abdominal tenderness.  Skin:    General: Skin is warm and dry.  Neurological:     Mental Status: She is alert and oriented to person, place, and time.  Psychiatric:        Behavior: Behavior normal.    Vitals:   06/28/19 1122  BP: [name])  148/89  Pulse: 87  Resp: 16  Temp: (!) 96 F (35.6 C)  TempSrc: Oral  SpO2: 96%  Weight: 182 lb 3.2 oz (82.6 kg)          Assessment & Plan:    [name] is a 58 y.o. female Essential hypertension - Plan: Comprehensive metabolic panel,   -Elevated off medicines, home readings stable.  Restart enalapril same dose, monitor home readings with RTC precautions.  Hypothyroidism, unspecified type - Plan: levothyroxine (SYNTHROID) 112 MCG tablet, TSH,   -Check TSH, continue Synthroid same dose.  Mixed hyperlipidemia - Plan: Lipid Panel,   -Check lipids. Can discuss various treatment options including different statin versus Zetia versus meeting with lipid specialist.  Need for prophylactic vaccination with combined diphtheria-tetanus-pertussis (DTP) vaccine - Plan: Tdap vaccine greater than or equal to 7yo IM,   -Tdap given  Handout given on situational stress with RTC precautions.    Meds ordered this encounter  Medications   enalapril (VASOTEC) 20 MG tablet    Sig: Take 1 tablet (20 mg total) by mouth daily.    Dispense:  90 tablet    Refill:  3   levothyroxine (SYNTHROID) 112 MCG tablet    Sig: Take 1 tablet (112 mcg total) by mouth daily before breakfast.    Dispense:  90 tablet    Refill:  3   Patient Instructions    No med changes for now. Keep a record of your blood pressures outside of the office and if over 140/90 - return to review med changes.   See info on stress below but if those symptoms worsen - please return to discuss further.  Depending on cholesterol reading, may need to discuss other statin. Can discuss options in person if needed.  Thanks for coming in today and stay safe.      Stress Stress is a normal reaction to life events. Stress is what you feel when life demands more than you are used to, or more than you think you can handle. Some stress can be useful, such as studying for a test or meeting a deadline at work. Stress that occurs too often or for too long can cause problems. It can affect your emotional health and interfere with relationships and normal daily activities. Too much stress can weaken your body's defense system (immune system) and increase your risk for physical illness. If you already have a medical problem, stress can make it worse. What are the causes? All sorts of life events can cause stress. An event that causes stress for one person may not be stressful for another person. Major life events, whether positive or negative, commonly cause stress. Examples include:  Losing a job or starting a new job.  Losing a loved one.  Moving to a new town or home.  Getting married or divorced.  Having a baby.  Injury or illness. Less obvious life events can also cause stress, especially if they occur day after day or in combination with each other. Examples include:  Working long hours.  Driving in traffic.  Caring for children.  Being in debt.  Being in a difficult relationship. What are the signs or symptoms? Stress can cause emotional symptoms, including:  Anxiety. This is feeling worried, afraid, on edge, overwhelmed, or out of control.  Anger, including irritation or impatience.  Depression. This is feeling sad, down, helpless, or guilty.  Trouble focusing, remembering, or making decisions. Stress can cause physical symptoms, including:  Aches and pains. These  may affect your head, neck, back, stomach, or other areas of your body.  Tight muscles or a clenched jaw.  Low energy.  Trouble sleeping. Stress can cause unhealthy behaviors, including:  Eating to feel better (overeating) or skipping meals.  Working too much  or putting off tasks.  Smoking, drinking alcohol, or using drugs to feel better. How is this diagnosed? Stress is diagnosed through an assessment by your health care provider. He or she may diagnose this condition based on:  Your symptoms and any stressful life events.  Your medical history.  Tests to rule out other causes of your symptoms. Depending on your condition, your health care provider may refer you to a specialist for further evaluation. How is this treated?  Stress management techniques are the recommended treatment for stress. Medicine is not typically recommended for the treatment of stress. Techniques to reduce your reaction to stressful life events include:  Stress identification. Monitor yourself for symptoms of stress and identify what causes stress for you. These skills may help you to avoid or prepare for stressful events.  Time management. Set your priorities, keep a calendar of events, and learn to say no. Taking these actions can help you avoid making too many commitments. Techniques for coping with stress include:  Rethinking the problem. Try to think realistically about stressful events rather than ignoring them or overreacting. Try to find the positives in a stressful situation rather than focusing on the negatives.  Exercise. Physical exercise can release both physical and emotional tension. The key is to find a form of exercise that you enjoy and do it regularly.  Relaxation techniques. These relax the body and mind. The key is to find one or more that you enjoy and use the technique(s) regularly. Examples include: ? Meditation, deep breathing, or progressive relaxation techniques. ? Yoga or tai chi. ? Biofeedback, mindfulness techniques, or journaling. ? Listening to music, being out in nature, or participating in other hobbies.  Practicing a healthy lifestyle. Eat a balanced diet, drink plenty of water, limit or avoid caffeine, and get plenty of  sleep.  Having a strong support network. Spend time with family, friends, or other people you enjoy being around. Express your feelings and talk things over with someone you trust. Counseling or talk therapy with a mental health professional may be helpful if you are having trouble managing stress on your own. Follow these instructions at home: Lifestyle   Avoid drugs.  Do not use any products that contain nicotine or tobacco, such as cigarettes and e-cigarettes. If you need help quitting, ask your health care provider.  Limit alcohol intake to no more than 1 drink a day for nonpregnant women and 2 drinks a day for men. One drink equals 12 oz of beer, 5 oz of wine, or 1 oz of hard liquor.  Do not use alcohol or drugs to relax.  Eat a balanced diet that includes fresh fruits and vegetables, whole grains, lean meats, fish, eggs, and beans, and low-fat dairy. Avoid processed foods and foods high in added fat, sugar, and salt.  Exercise at least 30 minutes on 5 or more days each week.  Get 7-8 hours of sleep each night. General instructions   Practice stress management techniques as discussed with your health care provider.  Drink enough fluid to keep your urine clear or pale yellow.  Take over-the-counter and prescription medicines only as told by your health care provider.  Keep all follow-up visits as told by your health  care provider. This is important. Contact a health care provider if:  Your symptoms get worse.  You have new symptoms.  You feel overwhelmed by your problems and can no longer manage them on your own. Get help right away if:  You have thoughts of hurting yourself or others. If you ever feel like you may hurt yourself or others, or have thoughts about taking your own life, get help right away. You can go to your nearest emergency department or call:  Your local emergency services (911 in the [place]  A suicide crisis helpline, such as the [place] at 815 039 9534. This is open 24 hours a day. Summary  Stress is a normal reaction to life events. It can cause problems if it happens too often or for too long.  Practicing stress management techniques is the best way to treat stress.  Counseling or talk therapy with a mental health professional may be helpful if you are having trouble managing stress on your own. This information is not intended to replace advice given to you by your health care provider. Make sure you discuss any questions you have with your health care provider. Document Released: 04/28/2001 Document Revised: 10/15/2017 Document Reviewed: 12/23/2016 Elsevier Patient Education  [employer].    If you have lab work done today you will be contacted with your lab results within the next 2 weeks.  If you have not heard from [place] then please contact us. The fastest way to get your results is to register for My Chart.   IF you received an x-ray today, you will receive an invoice from [facility]. Please contact [facility] at [phone] with questions or concerns regarding your invoice.   IF you received labwork today, you will receive an invoice from [place]. Please contact [facility] at [phone] with questions or concerns regarding your invoice.   Our billing staff will not be able to assist you with questions regarding bills from these companies.  You will be contacted with the lab results as soon as they are available. The fastest way to get your results is to activate your My Chart account. Instructions are located on the last page of this paperwork. If you have not heard from [place] regarding the results in 2 weeks, please contact this office.        Signed,   Merri Ray, MD [facility].  06/28/19 8:23 PM

## 2019-06-29 LAB — COMPREHENSIVE METABOLIC PANEL
ALT: 26 IU/L (ref 0–32)
AST: 25 IU/L (ref 0–40)
Albumin/Globulin Ratio: 2 (ref 1.2–2.2)
Albumin: 4.6 g/dL (ref 3.8–4.9)
Alkaline Phosphatase: 70 IU/L (ref 39–117)
BUN/Creatinine Ratio: 19 (ref 9–23)
BUN: 16 mg/dL (ref 6–24)
Bilirubin Total: 0.5 mg/dL (ref 0.0–1.2)
CO2: 21 mmol/L (ref 20–29)
Calcium: 9.9 mg/dL (ref 8.7–10.2)
Chloride: 102 mmol/L (ref 96–106)
Creatinine, Ser: 0.84 mg/dL (ref 0.57–1.00)
GFR calc Af Amer: 88 mL/min/{1.73_m2} (ref >59)
GFR calc non Af Amer: 76 mL/min/{1.73_m2} (ref >59)
Globulin, Total: 2.3 g/dL (ref 1.5–4.5)
Glucose: 92 mg/dL (ref 65–99)
Potassium: 4.3 mmol/L (ref 3.5–5.2)
Sodium: 140 mmol/L (ref 134–144)
Total Protein: 6.9 g/dL (ref 6.0–8.5)

## 2019-06-29 LAB — LIPID PANEL
Chol/HDL Ratio: 4.5 ratio — ABNORMAL HIGH (ref 0.0–4.4)
Cholesterol, Total: 286 mg/dL — ABNORMAL HIGH (ref 100–199)
HDL: 64 mg/dL (ref >39)
LDL Calculated: 185 mg/dL — ABNORMAL HIGH (ref 0–99)
Triglycerides: 185 mg/dL — ABNORMAL HIGH (ref 0–149)
VLDL Cholesterol Cal: 37 mg/dL (ref 5–40)

## 2019-06-29 LAB — TSH: TSH: 3.71 u[IU]/mL (ref 0.450–4.500)

## 2019-07-07 ENCOUNTER — Telehealth (INDEPENDENT_AMBULATORY_CARE_PROVIDER_SITE_OTHER): Payer: BC Managed Care – PPO | Admitting: Family Medicine

## 2019-07-07 DIAGNOSIS — R509 Fever, unspecified: Secondary | ICD-10-CM | POA: Diagnosis not present

## 2019-07-07 DIAGNOSIS — R110 Nausea: Secondary | ICD-10-CM | POA: Diagnosis not present

## 2019-07-07 DIAGNOSIS — R51 Headache: Secondary | ICD-10-CM

## 2019-07-07 DIAGNOSIS — R519 Headache, unspecified: Secondary | ICD-10-CM

## 2019-07-07 NOTE — Progress Notes (Signed)
Virtual Visit via Telephone Note  I connected with [name] on 07/07/19 at 3:44 PM by telephone and verified that I am speaking with the correct person using two identifiers.   I discussed the limitations, risks, security and privacy concerns of performing an evaluation and management service by telephone and the availability of in person appointments. I also discussed with the patient that there may be a patient responsible charge related to this service. The patient expressed understanding and agreed to proceed, consent obtained  Chief complaint:  possible covid sx's.   History of Present Illness: [name] is a 58 y.o. female  Started yesterday - woke up with upset stomach, HA. 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 Chills yesterday. Fever 101 last night, under 100 this morning, then higher today - back to 101.   Today - HA, upset stomach, more fatigue today. Some body aches.  No cough/congestion.  No change in taste smell.  No vomiting, just nausea, decrease appetite. Some scrambled eggs today, but some fluids -some coke today some water.  Last uop - 5 hrs ago. Some abd pain/cramping, but that is better today.  Other than fatigue, and HA   No history of gallbladder disease or diverticulitis. No known sick contacts. No covid exposures known.    Tx: ASA - occasional.    Patient Active Problem List   Diagnosis Date Noted  . Prediabetes 06/09/2017  . 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 Hyperlipidemia 10/21/2016  . Obesity 02/15/2014  . 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 Hypothyroid 06/10/2012  . HTN (hypertension) 06/10/2012   Past Medical History:  Diagnosis Date  . Allergy    dust mite allergies; Zyrtec.  [name]. Blood transfusion without reported diagnosis    post-operatively in 20s after L1 fracture with spinal cord injury after horseback riding.  . Chronic kidney disease    [facility]  [name]. Hyperlipidemia   . Hypertension   . Thyroid disease    Past Surgical History:  Procedure Laterality Date  . ABDOMINAL HYSTERECTOMY     DUB; ovaries remaining.  [name]. CESAREAN SECTION    . Sleep Study  12/17/2010   No sleep disordered breathing or periodic limb mvmets.  Excess light sleep.  [name]. SPINE SURGERY     L1 fracture with spinal cord injury with horseback riding.  . TUBAL LIGATION     Allergies  Allergen Reactions  . Sulfa Antibiotics Hives  . Lipitor [Atorvastatin] Other (See Comments)    Fatigue and confusion   Prior to Admission medications   Medication Sig Start Date End Date Taking? Authorizing Provider  enalapril (VASOTEC) 20 MG tablet Take 1 tablet (20 mg total) by mouth daily. 06/28/19  Yes Shade FloodGreene, Brytnee Bechler R, MD  levothyroxine (SYNTHROID) 112 MCG tablet Take 1 tablet (112 mcg total) by mouth daily before breakfast. 06/28/19  Yes Shade FloodGreene, Anthonette Lesage R, MD   Social History   Socioeconomic History  . Marital status: Married    Spouse name: Not on file  . Number of children: Not on file  . Years of education: Not on file  . Highest education level: Not on file  Occupational History  . Not on file  Social Needs  . Financial resource strain: Not on file  . Food insecurity    Worry: Not on file    Inability: Not on file  . Transportation needs    Medical: Not  on file    Non-medical: Not on file  Tobacco Use  . Smoking status: Never Smoker  . Smokeless tobacco: Never Used  Substance and Sexual Activity  . Alcohol use: Yes    Comment: occ  . Drug use: No  . Sexual activity: Yes    Birth control/protection: None  Lifestyle  . Physical activity    Days per week: Not on file    Minutes per session: Not on file  . Stress: Not on file  Relationships  . Social Herbalist on phone: Not on file    Gets together: Not on file    Attends religious service: Not on file    Active member of club or organization: Not on file     Attends meetings of clubs or organizations: Not on file    Relationship status: Not on file  . Intimate partner violence    Fear of current or ex partner: Not on file    Emotionally abused: Not on file    Physically abused: Not on file    Forced sexual activity: Not on file  Other Topics Concern  . Not on file  Social History Narrative   Marital status: married x 20+ years; happily married.      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      Tobacco; none      Alcohol:  Socially      Drugs: none      Exercise: sporadic     Observations/Objective: Coherent speech, no distress on phone.  Speaking in full sentences.  Understand expressive plan with all questions answered.  Assessment and Plan: Fever, unspecified - Plan: Novel Coronavirus, NAA ([facility])  Nausea without vomiting - Plan: Novel Coronavirus, NAA ([facility])  Nonintractable headache, unspecified chronicity pattern, unspecified headache type - Plan: Novel Coronavirus, NAA ([facility])  -Gastroenteritis versus possible COVID-19.  No known sick contacts.  Some improvement of symptoms except for headache and fatigue.  May have component of volume depletion.  -Plan for COVID testing, information provided for mobile testing tomorrow morning.  -Oral rehydration therapy with small sips of fluids frequently discussed.  Tylenol as needed for headache/myalgias.  ER precautions if acute changes or worsening symptoms.  Follow Up Instructions:    Patient Instructions    I am sorry to hear that you are sick.  Tylenol can be used as needed for body aches or headache.  Small sips of fluids frequently, water is best.  I did order the COVID-19 test to be done.  It appears there is a mobile testing site set up tomorrow morning: Saturday, Aug. 22, 8 am- noon, [place], [place]  If any worsening symptoms over the  weekend including worsening abdominal pain, inability to keep fluids down, increasing lightheadedness or dizziness, shortness of breath at rest, or confusion/mental status changes, be seen in the emergency room.     If you have lab work done today you will be contacted with your lab results within the next 2 weeks.  If you have not heard from [place] then please contact us. The fastest way to get your results is to register for My Chart.   IF you received an x-ray today, you will receive an invoice from [facility]. Please contact [facility] at [phone] with questions or concerns regarding your invoice.  IF you received labwork today, you will receive an invoice from [facility]. Please contact [facility] at [phone] with questions or concerns regarding your invoice.   Our billing staff will not be able to assist you with questions regarding bills from these companies.  You will be contacted with the lab results as soon as they are available. The fastest way to get your results is to activate your My Chart account. Instructions are located on the last page of this paperwork. If you have not heard from us regarding the results in 2 weeks, please contact this office.        I discussed the assessment and treatment plan with the patient. The patient was provided an opportunity to ask questions and all were answered. The patient agreed with the plan and demonstrated an understanding of the instructions.   The patient was advised to call back or seek an in-person evaluation if the symptoms worsen or if the condition fails to improve as anticipated.  I provided 16 minutes of non-face-to-face time during this encounter.  Signed,   Meredith StaggersJeffrey Renata Gambino, MD [facility].  07/07/19

## 2019-07-07 NOTE — Progress Notes (Signed)
CC- covid symptoms- Patient stated she started feeling bad 07/06/19 then last night got worse with Fever of 101.4, stomach ache, body aches, headache,and chills. No cough and no sob at this time.  Patient was informed we probably would need to send her to have a covid test.

## 2019-07-07 NOTE — Patient Instructions (Addendum)
  I am sorry to hear that you are sick.  Tylenol can be used as needed for body aches or headache.  Small sips of fluids frequently, water is best.  I did order the COVID-19 test to be done.  It appears there is a mobile testing site set up tomorrow morning: Saturday, Aug. 22, 8 am- noon, [place], [place]  If any worsening symptoms over the weekend including worsening abdominal pain, inability to keep fluids down, increasing lightheadedness or dizziness, shortness of breath at rest, or confusion/mental status changes, be seen in the emergency room.     If you have lab work done today you will be contacted with your lab results within the next 2 weeks.  If you have not heard from [place] then please contact us. The fastest way to get your results is to register for My Chart.   IF you received an x-ray today, you will receive an invoice from [facility]. Please contact [facility] at [phone] with questions or concerns regarding your invoice.   IF you received labwork today, you will receive an invoice from [facility]. Please contact [facility] at [phone] with questions or concerns regarding your invoice.   Our billing staff will not be able to assist you with questions regarding bills from these companies.  You will be contacted with the lab results as soon as they are available. The fastest way to get your results is to activate your My Chart account. Instructions are located on the last page of this paperwork. If you have not heard from [place] regarding the results in 2 weeks, please contact this office.

## 2019-07-08 ENCOUNTER — Other Ambulatory Visit: Payer: Self-pay

## 2019-07-08 DIAGNOSIS — Z20822 Contact with and (suspected) exposure to covid-19: Secondary | ICD-10-CM

## 2019-07-09 LAB — NOVEL CORONAVIRUS, NAA: SARS-CoV-2, NAA: NOT DETECTED

## 2019-09-28 ENCOUNTER — Ambulatory Visit: Payer: BC Managed Care – PPO

## 2019-09-29 ENCOUNTER — Other Ambulatory Visit: Payer: Self-pay

## 2019-09-29 ENCOUNTER — Ambulatory Visit (INDEPENDENT_AMBULATORY_CARE_PROVIDER_SITE_OTHER): Payer: BC Managed Care – PPO | Admitting: Family Medicine

## 2019-09-29 DIAGNOSIS — Z23 Encounter for immunization: Secondary | ICD-10-CM | POA: Diagnosis not present

## 2019-12-28 ENCOUNTER — Ambulatory Visit (INDEPENDENT_AMBULATORY_CARE_PROVIDER_SITE_OTHER): Payer: BC Managed Care – PPO | Admitting: Family Medicine

## 2019-12-28 ENCOUNTER — Other Ambulatory Visit: Payer: Self-pay

## 2019-12-28 ENCOUNTER — Encounter: Payer: Self-pay | Admitting: Family Medicine

## 2019-12-28 VITALS — BP 132/85 | HR 71 | Temp 98.0°F | Ht 60.0 in | Wt 181.0 lb

## 2019-12-28 DIAGNOSIS — E785 Hyperlipidemia, unspecified: Secondary | ICD-10-CM

## 2019-12-28 DIAGNOSIS — Z6835 Body mass index (BMI) 35.0-35.9, adult: Secondary | ICD-10-CM | POA: Diagnosis not present

## 2019-12-28 DIAGNOSIS — E039 Hypothyroidism, unspecified: Secondary | ICD-10-CM

## 2019-12-28 DIAGNOSIS — I10 Essential (primary) hypertension: Secondary | ICD-10-CM | POA: Diagnosis not present

## 2019-12-28 DIAGNOSIS — Z0001 Encounter for general adult medical examination with abnormal findings: Secondary | ICD-10-CM

## 2019-12-28 DIAGNOSIS — Z0000 Encounter for general adult medical examination without abnormal findings: Secondary | ICD-10-CM

## 2019-12-28 MED ORDER — SHINGRIX 50 MCG/0.5ML IM SUSR: 0.5000 mL | Freq: Once | INTRAMUSCULAR | 1 refills | Status: AC

## 2019-12-28 NOTE — Patient Instructions (Addendum)
No change in medications today.  I will check some lab work.  Shingles vaccine sent to pharmacy.  Keep up the good work with diet and activity. Here is another option for weight management. Please let me know if I can help further.  Dennard Nip, MD Medical Weight Loss Management. . [phone]  Keeping You Healthy  Get These Tests  Blood Pressure- Have your blood pressure checked by your healthcare provider at least once a year.  Normal blood pressure is 120/80.  Weight- Have your body mass index (BMI) calculated to screen for obesity.  BMI is a measure of body fat based on height and weight.  You can calculate your own BMI at [employer].it  Cholesterol- Have your cholesterol checked every year.  Diabetes- Have your blood sugar checked every year if you have high blood pressure, high cholesterol, a family history of diabetes or if you are overweight.  Pap Test - Have a pap test every 1 to 5 years if you have been sexually active.  If you are older than 65 and recent pap tests have been normal you may not need additional pap tests.  In addition, if you have had a hysterectomy  for benign disease additional pap tests are not necessary.  Mammogram-Yearly mammograms are essential for early detection of breast cancer  Screening for Colon Cancer- Colonoscopy starting at age 33. Screening may begin sooner depending on your family history and other health conditions.  Follow up colonoscopy as directed by your Gastroenterologist.  Screening for Osteoporosis- Screening begins at age 54 with bone density scanning, sooner if you are at higher risk for developing Osteoporosis.  Get these medicines  Calcium with Vitamin D- Your body requires 1200-1500 mg of Calcium a day and [phone] IU of Vitamin D a day.  You can only absorb 500 mg of Calcium at a time therefore Calcium must be taken in 2 or 3 separate doses throughout the day.  Hormones- Hormone therapy has been associated with  increased risk for certain cancers and heart disease.  Talk to your healthcare provider about if you need relief from menopausal symptoms.  Aspirin- Ask your healthcare provider about taking Aspirin to prevent Heart Disease and Stroke.  Get these Immuniztions  Flu shot- Every fall  Pneumonia shot- Once after the age of 10; if you are younger ask your healthcare provider if you need a pneumonia shot.  Tetanus- Every ten years.  Zostavax- Once after the age of 72 to prevent shingles.  Take these steps  Don't smoke- Your healthcare provider can help you quit. For tips on how to quit, ask your healthcare provider or go to www.smokefree.gov or call 1-800 QUIT-NOW.  Be physically active- Exercise 5 days a week for a minimum of 30 minutes.  If you are not already physically active, start slow and gradually work up to 30 minutes of moderate physical activity.  Try walking, dancing, bike riding, swimming, etc.  Eat a healthy diet- Eat a variety of healthy foods such as fruits, vegetables, whole grains, low fat milk, low fat cheeses, yogurt, lean meats, chicken, fish, eggs, dried beans, tofu, etc.  For more information go to www.thenutritionsource.org  Dental visit- Brush and floss teeth twice daily; visit your dentist twice a year.  Eye exam- Visit your Optometrist or Ophthalmologist yearly.  Drink alcohol in moderation- Limit alcohol intake to one drink or less a day.  Never drink and drive.  Depression- Your emotional health is as important as your physical health.  If  you're feeling down or losing interest in things you normally enjoy, please talk to your healthcare provider.  Seat Belts- can save your life; always wear one  Smoke/Carbon Monoxide detectors- These detectors need to be installed on the appropriate level of your home.  Replace batteries at least once a year.  Violence- If anyone is threatening or hurting you, please tell your healthcare provider.  Living Will/ Health care  power of attorney- Discuss with your healthcare provider and family.  If you have lab work done today you will be contacted with your lab results within the next 2 weeks.  If you have not heard from [place] then please contact us. The fastest way to get your results is to register for My Chart.   IF you received an x-ray today, you will receive an invoice from [facility]. Please contact [facility] at [phone] with questions or concerns regarding your invoice.   IF you received labwork today, you will receive an invoice from [place]. Please contact [facility] at [phone] with questions or concerns regarding your invoice.   Our billing staff will not be able to assist you with questions regarding bills from these companies.  You will be contacted with the lab results as soon as they are available. The fastest way to get your results is to activate your My Chart account. Instructions are located on the last page of this paperwork. If you have not heard from [place] regarding the results in 2 weeks, please contact this office.

## 2019-12-28 NOTE — Progress Notes (Signed)
Subjective:  Patient ID: [name], female    DOB: 04/25/1961  Age: 59 y.o. MRN: [id number]  CC:  Chief Complaint  Patient presents with  . Annual Exam    pt states she feels well today with no complants. pt states she is strugling with weightloss, but is doing her best to make health choices. pt hasn't had any issues with hypertension, and no physical symptoms either.    HPI [name] presents for annual physical exam.   Hypertension: Elevated in August off medications, restarted on her enalapril 20 mg with RTC precautions. Home readings: BP Readings from Last 3 Encounters:  12/28/19 132/85  06/28/19 (!) 148/89  09/09/18 116/74   Lab Results  Component Value Date   CREATININE 0.84 06/28/2019   Hyperlipidemia: Elevated LDL at 185 in August, higher than previous readings.  Recommended trial of a different statin or Zetia versus lipid specialist.  Have not heard from her since that visit. The 10-year ASCVD risk score Denman George DC Montez Hageman., et al., 2013) is: 5.1%   Values used to calculate the score:     Age: 84 years     Sex: Female     Is Non-Hispanic African American: No     Diabetic: No     Tobacco smoker: No     Systolic Blood Pressure: 132 mmHg     Is BP treated: Yes     HDL Cholesterol: 64 mg/dL     Total Cholesterol: 286 mg/dL  Still struggling with weight. Trouble losing weight. Trying to make healthy choices, exercising. 2 years ago was 155. Wants to lose weight. Successful with prior weight mgt center- too many appts per week and meal replacements - not sustainable.  More of a routine now, rouble during pandemic. Plans on restarting swimming this summer.   Wt Readings from Last 3 Encounters:  12/28/19 181 lb (82.1 kg)  06/28/19 182 lb 3.2 oz (82.6 kg)  09/09/18 174 lb (78.9 kg)   Lab Results  Component Value Date   CHOL 286 (H) 06/28/2019   HDL 64 06/28/2019   LDLCALC 185 (H) 06/28/2019   TRIG 185 (H) 06/28/2019   CHOLHDL 4.5 (H) 06/28/2019    Lab Results  Component Value Date   ALT 26 06/28/2019   AST 25 06/28/2019   ALKPHOS 70 06/28/2019   BILITOT 0.5 06/28/2019   Hypothyroidism: Lab Results  Component Value Date   TSH 3.710 06/28/2019  Taking medication daily.  Synthroid 112 mcg daily. No new hot or cold intolerance. No new hair or skin changes, heart palpitations or new fatigue. No new weight changes.   Cancer screening: Cologuard 06/30/2017 Mammogram 09/18/2019 Pap 09/18/19  Immunization History  Administered Date(s) Administered  . Influenza, Seasonal, Injecte, Preservative Fre 12/18/2012  . Influenza,inj,Quad PF,6+ Mos 08/05/2014, 09/16/2017, 09/09/2018, 09/29/2019  . Tdap 06/16/2009, 06/28/2019  Shingles: has not had - agrees to Rx.   Depression screen [facility] 2/9 12/28/2019 06/28/2019 09/09/2018 03/25/2018 11/18/2017  Decreased Interest 0 0 0 0 0  Down, Depressed, Hopeless 0 0 0 0 0  PHQ - 2 Score 0 0 0 0 0    Hearing Screening   125Hz  250Hz  500Hz  1000Hz  2000Hz  3000Hz  4000Hz  6000Hz  8000Hz   Right ear:           Left ear:             Visual Acuity Screening   Right eye Left eye Both eyes  Without correction:     With correction: 20/20 20/20  20/15   Due for visit with optho - scheduling.   Dental: every 6 months. Due for visit.   Exercise: 5-6 days per week. - walking or exercise bike.   History Patient Active Problem List   Diagnosis Date Noted  . Prediabetes 06/09/2017  . 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 Hyperlipidemia 10/21/2016  . Obesity 02/15/2014  . 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 Hypothyroid 06/10/2012  . HTN (hypertension) 06/10/2012   Past Medical History:  Diagnosis Date  . Allergy    dust mite allergies; Zyrtec.  [name] Blood transfusion without reported diagnosis    post-operatively in 20s after L1 fracture with spinal cord injury after horseback riding.  . Chronic kidney disease    Tuberous Sclerosis; [facility].  [name]  Hyperlipidemia   . Hypertension   . Thyroid disease    Past Surgical History:  Procedure Laterality Date  . ABDOMINAL HYSTERECTOMY     DUB; ovaries remaining.  [name] CESAREAN SECTION    . Sleep Study  12/17/2010   No sleep disordered breathing or periodic limb mvmets.  Excess light sleep.  [name] SPINE SURGERY     L1 fracture with spinal cord injury with horseback riding.  . TUBAL LIGATION     Allergies  Allergen Reactions  . Sulfa Antibiotics Hives  . Lipitor [Atorvastatin] Other (See Comments)    Fatigue and confusion   Prior to Admission medications   Medication Sig Start Date End Date Taking? Authorizing Provider  enalapril (VASOTEC) 20 MG tablet Take 1 tablet (20 mg total) by mouth daily. 06/28/19   [name], MD  levothyroxine (SYNTHROID) 112 MCG tablet Take 1 tablet (112 mcg total) by mouth daily before breakfast. 06/28/19   [name], MD   Social History   Socioeconomic History  . Marital status: Married    Spouse name: Not on file  . Number of children: Not on file  . Years of education: Not on file  . Highest education level: Not on file  Occupational History  . Not on file  Tobacco Use  . Smoking status: Never Smoker  . Smokeless tobacco: Never Used  Substance and Sexual Activity  . Alcohol use: Yes    Comment: occ  . Drug use: No  . Sexual activity: Yes    Birth control/protection: None  Other Topics Concern  . Not on file  Social History Narrative   Marital status: married x 20+ years; happily married.      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      Tobacco; none      Alcohol:  Socially      Drugs: none      Exercise: sporadic   Social Determinants of Health   Financial Resource Strain:   . Difficulty of Paying Living Expenses: Not on file  Food Insecurity:   . Worried About Programme researcher, broadcasting/film/video in the Last Year: Not on file  . Ran Out of Food in the  Last Year: Not on file  Transportation Needs:   . Lack of Transportation (Medical): Not on file  . Lack of Transportation (Non-Medical): Not on file  Physical Activity:   . Days of Exercise per Week: Not on file  . Minutes of Exercise per Session: Not on file  Stress:   . Feeling of  Stress : Not on file  Social Connections:   . Frequency of Communication with Friends and Family: Not on file  . Frequency of Social Gatherings with Friends and Family: Not on file  . Attends Religious Services: Not on file  . Active Member of Clubs or Organizations: Not on file  . Attends Archivist Meetings: Not on file  . Marital Status: Not on file  Intimate Partner Violence:   . Fear of Current or Ex-Partner: Not on file  . Emotionally Abused: Not on file  . Physically Abused: Not on file  . Sexually Abused: Not on file    Review of Systems  13 point review of systems per patient health survey noted.  Negative other than as indicated above or in HPI.   Objective:   Vitals:   12/28/19 0804  BP: 132/85  Pulse: 71  Temp: 98 F (36.7 C)  TempSrc: Temporal  SpO2: 94%  Weight: 181 lb (82.1 kg)  Height: 5' (1.524 m)    Physical Exam Constitutional:      Appearance: She is well-developed.  HENT:     Head: Normocephalic and atraumatic.     Right Ear: External ear normal.     Left Ear: External ear normal.  Eyes:     Conjunctiva/sclera: Conjunctivae normal.     Pupils: Pupils are equal, round, and reactive to light.  Neck:     Thyroid: No thyromegaly.     Comments: No thyromegaly appreciated. Cardiovascular:     Rate and Rhythm: Normal rate and regular rhythm.     Heart sounds: Normal heart sounds. No murmur.  Pulmonary:     Effort: Pulmonary effort is normal. No respiratory distress.     Breath sounds: Normal breath sounds. No wheezing.  Abdominal:     General: Bowel sounds are normal.     Palpations: Abdomen is soft.     Tenderness: There is no abdominal tenderness.    Musculoskeletal:        General: No tenderness. Normal range of motion.     Cervical back: Normal range of motion and neck supple.  Lymphadenopathy:     Cervical: No cervical adenopathy.  Skin:    General: Skin is warm and dry.     Findings: No rash.  Neurological:     Mental Status: She is alert and oriented to person, place, and time.  Psychiatric:        Behavior: Behavior normal.        Thought Content: Thought content normal.        Assessment & Plan:  EMINA RIBAUDO is a 59 y.o. female . Annual physical exam  - -anticipatory guidance as below in AVS, screening labs above. Health maintenance items as above in HPI discussed/recommended as applicable.   Essential hypertension - Plan: Lipid panel, COMPLETE METABOLIC PANEL WITH GFR  -Stable, continue same regimen  Hypothyroidism, unspecified type - Plan: TSH  -Tolerating current dose continue Synthroid.  Check TSH  BMI 35.0-35.9,adult  -Some trouble with weight loss.  Was commended on her diet exercise approaches.  Will provide phone number to medical weight loss specialist.  Potentially could consider meeting with bariatric surgeon but will meet with medical weight loss specialist approach first.    Hyperlipidemia, unspecified hyperlipidemia type  -Repeat lipids, previous ASCVD risk score not high enough for statin at this time.  Anticipate improvement with continued diet, exercise.  Meds ordered this encounter  Medications  . Zoster Vaccine Adjuvanted [facility]) injection  Sig: Inject 0.5 mLs into the muscle once for 1 dose. Repeat in 2-6 months.    Dispense:  0.5 mL    Refill:  1   Patient Instructions   No change in medications today.  I will check some lab work.  Shingles vaccine sent to pharmacy.  Keep up the good work with diet and activity. Here is another option for weight management. Please let me know if I can help further.  Quillian Quince, MD Medical Weight Loss  Management. . [phone]  Keeping You Healthy  Get These Tests  Blood Pressure- Have your blood pressure checked by your healthcare provider at least once a year.  Normal blood pressure is 120/80.  Weight- Have your body mass index (BMI) calculated to screen for obesity.  BMI is a measure of body fat based on height and weight.  You can calculate your own BMI at https://www.west-esparza.com/  Cholesterol- Have your cholesterol checked every year.  Diabetes- Have your blood sugar checked every year if you have high blood pressure, high cholesterol, a family history of diabetes or if you are overweight.  Pap Test - Have a pap test every 1 to 5 years if you have been sexually active.  If you are older than 65 and recent pap tests have been normal you may not need additional pap tests.  In addition, if you have had a hysterectomy  for benign disease additional pap tests are not necessary.  Mammogram-Yearly mammograms are essential for early detection of breast cancer  Screening for Colon Cancer- Colonoscopy starting at age 40. Screening may begin sooner depending on your family history and other health conditions.  Follow up colonoscopy as directed by your Gastroenterologist.  Screening for Osteoporosis- Screening begins at age 9 with bone density scanning, sooner if you are at higher risk for developing Osteoporosis.  Get these medicines  Calcium with Vitamin D- Your body requires 1200-1500 mg of Calcium a day and [phone] IU of Vitamin D a day.  You can only absorb 500 mg of Calcium at a time therefore Calcium must be taken in 2 or 3 separate doses throughout the day.  Hormones- Hormone therapy has been associated with increased risk for certain cancers and heart disease.  Talk to your healthcare provider about if you need relief from menopausal symptoms.  Aspirin- Ask your healthcare provider about taking Aspirin to prevent Heart Disease and Stroke.  Get these Immuniztions  Flu shot-  Every fall  Pneumonia shot- Once after the age of 80; if you are younger ask your healthcare provider if you need a pneumonia shot.  Tetanus- Every ten years.  Zostavax- Once after the age of 73 to prevent shingles.  Take these steps  Don't smoke- Your healthcare provider can help you quit. For tips on how to quit, ask your healthcare provider or go to www.smokefree.gov or call 1-800 QUIT-NOW.  Be physically active- Exercise 5 days a week for a minimum of 30 minutes.  If you are not already physically active, start slow and gradually work up to 30 minutes of moderate physical activity.  Try walking, dancing, bike riding, swimming, etc.  Eat a healthy diet- Eat a variety of healthy foods such as fruits, vegetables, whole grains, low fat milk, low fat cheeses, yogurt, lean meats, chicken, fish, eggs, dried beans, tofu, etc.  For more information go to www.thenutritionsource.org  Dental visit- Brush and floss teeth twice daily; visit your dentist twice a year.  Eye exam- Visit your Optometrist or Ophthalmologist  yearly.  Drink alcohol in moderation- Limit alcohol intake to one drink or less a day.  Never drink and drive.  Depression- Your emotional health is as important as your physical health.  If you're feeling down or losing interest in things you normally enjoy, please talk to your healthcare provider.  Seat Belts- can save your life; always wear one  Smoke/Carbon Monoxide detectors- These detectors need to be installed on the appropriate level of your home.  Replace batteries at least once a year.  Violence- If anyone is threatening or hurting you, please tell your healthcare provider.  Living Will/ Health care power of attorney- Discuss with your healthcare provider and family.  If you have lab work done today you will be contacted with your lab results within the next 2 weeks.  If you have not heard from [place] then please contact us. The fastest way to get your results is to  register for My Chart.   IF you received an x-ray today, you will receive an invoice from [facility]. Please contact [facility] at [phone] with questions or concerns regarding your invoice.   IF you received labwork today, you will receive an invoice from [place]. Please contact [facility] at [phone] with questions or concerns regarding your invoice.   Our billing staff will not be able to assist you with questions regarding bills from these companies.  You will be contacted with the lab results as soon as they are available. The fastest way to get your results is to activate your My Chart account. Instructions are located on the last page of this paperwork. If you have not heard from [place] regarding the results in 2 weeks, please contact this office.         Signed, Meredith Staggers, MD [facility]

## 2019-12-29 LAB — COMPREHENSIVE METABOLIC PANEL
ALT: 33 IU/L — ABNORMAL HIGH (ref 0–32)
AST: 26 IU/L (ref 0–40)
Albumin/Globulin Ratio: 2.1 (ref 1.2–2.2)
Albumin: 4.8 g/dL (ref 3.8–4.9)
Alkaline Phosphatase: 65 IU/L (ref 39–117)
BUN/Creatinine Ratio: 22 (ref 9–23)
BUN: 19 mg/dL (ref 6–24)
Bilirubin Total: 0.6 mg/dL (ref 0.0–1.2)
CO2: 22 mmol/L (ref 20–29)
Calcium: 10.1 mg/dL (ref 8.7–10.2)
Chloride: 102 mmol/L (ref 96–106)
Creatinine, Ser: 0.85 mg/dL (ref 0.57–1.00)
GFR calc Af Amer: 87 mL/min/{1.73_m2} (ref >59)
GFR calc non Af Amer: 75 mL/min/{1.73_m2} (ref >59)
Globulin, Total: 2.3 g/dL (ref 1.5–4.5)
Glucose: 98 mg/dL (ref 65–99)
Potassium: 4.6 mmol/L (ref 3.5–5.2)
Sodium: 139 mmol/L (ref 134–144)
Total Protein: 7.1 g/dL (ref 6.0–8.5)

## 2019-12-29 LAB — LIPID PANEL
Chol/HDL Ratio: 5.3 ratio — ABNORMAL HIGH (ref 0.0–4.4)
Cholesterol, Total: 277 mg/dL — ABNORMAL HIGH (ref 100–199)
HDL: 52 mg/dL (ref >39)
LDL Chol Calc (NIH): 183 mg/dL — ABNORMAL HIGH (ref 0–99)
Triglycerides: 224 mg/dL — ABNORMAL HIGH (ref 0–149)
VLDL Cholesterol Cal: 42 mg/dL — ABNORMAL HIGH (ref 5–40)

## 2019-12-29 LAB — TSH: TSH: 0.943 u[IU]/mL (ref 0.450–4.500)

## 2020-01-02 ENCOUNTER — Encounter: Payer: BC Managed Care – PPO | Admitting: Family Medicine

## 2020-03-01 ENCOUNTER — Other Ambulatory Visit: Payer: Self-pay

## 2020-03-01 ENCOUNTER — Ambulatory Visit (INDEPENDENT_AMBULATORY_CARE_PROVIDER_SITE_OTHER): Payer: BC Managed Care – PPO

## 2020-03-01 ENCOUNTER — Encounter: Payer: Self-pay | Admitting: Registered Nurse

## 2020-03-01 ENCOUNTER — Ambulatory Visit: Payer: BC Managed Care – PPO | Admitting: Registered Nurse

## 2020-03-01 VITALS — BP 132/84 | HR 70 | Temp 98.0°F | Resp 14 | Ht 60.0 in | Wt 183.4 lb

## 2020-03-01 DIAGNOSIS — M25551 Pain in right hip: Secondary | ICD-10-CM

## 2020-03-01 DIAGNOSIS — M545 Low back pain, unspecified: Secondary | ICD-10-CM

## 2020-03-01 DIAGNOSIS — M5416 Radiculopathy, lumbar region: Secondary | ICD-10-CM | POA: Diagnosis not present

## 2020-03-01 DIAGNOSIS — G8929 Other chronic pain: Secondary | ICD-10-CM

## 2020-03-01 DIAGNOSIS — S32010A Wedge compression fracture of first lumbar vertebra, initial encounter for closed fracture: Secondary | ICD-10-CM

## 2020-03-01 MED ORDER — GABAPENTIN 100 MG PO CAPS: 100.0000 mg | ORAL_CAPSULE | Freq: Three times a day (TID) | ORAL | 3 refills | Status: DC

## 2020-03-01 NOTE — Patient Instructions (Signed)
° ° ° °  If you have lab work done today you will be contacted with your lab results within the next 2 weeks.  If you have not heard from us then please contact us. The fastest way to get your results is to register for My Chart. ° ° °IF you received an x-ray today, you will receive an invoice from [place] [facility]. Please contact [place] [facility] at [phone] with questions or concerns regarding your invoice.  ° °IF you received labwork today, you will receive an invoice from [facility]. Please contact [facility] at [phone] with questions or concerns regarding your invoice.  ° °Our billing staff will not be able to assist you with questions regarding bills from these companies. ° °You will be contacted with the lab results as soon as they are available. The fastest way to get your results is to activate your My Chart account. Instructions are located on the last page of this paperwork. If you have not heard from us regarding the results in 2 weeks, please contact this office. °  ° ° ° °

## 2020-03-01 NOTE — Progress Notes (Signed)
Acute Office Visit  Subjective:    Patient ID: [name], female    DOB: Mar 04, 1961, 59 y.o.   MRN: [id number]  Chief Complaint  Patient presents with  . Fall    pt fell apx 8 weeks ago and is unable to get rid or Rt sided/hip pain, fell only 2 ft apx     HPI Patient is in today for fall  8 weeks ago was standing on a crate to reach a high shelf, slipped and fell about two feel and landed on her back side on wooden decking. Immediate pain and bruising that has since resolved, but still having some concerns for sciatic type pain radiating down the leg. At times dull, other are sharp and shooting pain depending on positioning and movement. Has tried OTC analgesics, stretching and exercising, rest, and heat without relief  Of note, she was in an accident in her 61s that resulted in an L1 compression fracture for which she had decompression surgery with rods placed along her spine. Unsure of the date of her last spinal imaging. No previous spinal imaging available in [facility].  No numbness, weakness, tingling in lower extremities. No loss of bowel or bladder control. No new or different headaches.   Past Medical History:  Diagnosis Date  . Allergy    dust mite allergies; Zyrtec.  [name] Blood transfusion without reported diagnosis    post-operatively in 20s after L1 fracture with spinal cord injury after horseback riding.  . Chronic kidney disease    Tuberous Sclerosis; [facility].  [name] Hyperlipidemia   . Hypertension   . Thyroid disease     Past Surgical History:  Procedure Laterality Date  . ABDOMINAL HYSTERECTOMY     DUB; ovaries remaining.  [name] CESAREAN SECTION    . Sleep Study  12/17/2010   No sleep disordered breathing or periodic limb mvmets.  Excess light sleep.  [name] SPINE SURGERY     L1 fracture with spinal cord injury with horseback riding.  . TUBAL LIGATION      Family History  Problem Relation Age of Onset  . Thyroid disease Mother   . Arthritis Mother   . Asthma  Mother   . Migraines Mother   . Heart disease Father 100       AMI age 21; CABG at 60.  . Arthritis Father     Social History   Socioeconomic History  . Marital status: Married    Spouse name: Not on file  . Number of children: Not on file  . Years of education: Not on file  . Highest education level: Not on file  Occupational History  . Not on file  Tobacco Use  . Smoking status: Never Smoker  . Smokeless tobacco: Never Used  Substance and Sexual Activity  . Alcohol use: Yes    Comment: occ  . Drug use: No  . Sexual activity: Yes    Birth control/protection: None  Other Topics Concern  . Not on file  Social History Narrative   Marital status: married x 20+ years; happily married.      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      Tobacco; none      Alcohol:  Socially      Drugs: none      Exercise: sporadic   Social Determinants of Health   Financial  Resource Strain:   . Difficulty of Paying Living Expenses:   Food Insecurity:   . Worried About Charity fundraiser in the Last Year:   . Arboriculturist in the Last Year:   Transportation Needs:   . Film/video editor (Medical):   [name] Lack of Transportation (Non-Medical):   Physical Activity:   . Days of Exercise per Week:   . Minutes of Exercise per Session:   Stress:   . Feeling of Stress :   Social Connections:   . Frequency of Communication with Friends and Family:   . Frequency of Social Gatherings with Friends and Family:   . Attends Religious Services:   . Active Member of Clubs or Organizations:   . Attends Archivist Meetings:   [name] Marital Status:   Intimate Partner Violence:   . Fear of Current or Ex-Partner:   . Emotionally Abused:   [name] Physically Abused:   . Sexually Abused:     Outpatient Medications Prior to Visit  Medication Sig Dispense Refill  . enalapril (VASOTEC) 20 MG tablet Take 1  tablet (20 mg total) by mouth daily. 90 tablet 3  . levothyroxine (SYNTHROID) 112 MCG tablet Take 1 tablet (112 mcg total) by mouth daily before breakfast. 90 tablet 3   No facility-administered medications prior to visit.    Allergies  Allergen Reactions  . Sulfa Antibiotics Hives  . Lipitor [Atorvastatin] Other (See Comments)    Fatigue and confusion    Review of Systems  Constitutional: Negative.   HENT: Negative.   Eyes: Negative.   Respiratory: Negative.   Cardiovascular: Negative.   Gastrointestinal: Negative.   Endocrine: Negative.   Genitourinary: Negative.   Musculoskeletal: Positive for back pain and gait problem. Negative for arthralgias, joint swelling, myalgias, neck pain and neck stiffness.  Skin: Negative.   Allergic/Immunologic: Negative.   Neurological: Negative for dizziness, tremors, seizures, syncope, facial asymmetry, speech difficulty, weakness, light-headedness, numbness and headaches.  Hematological: Negative.   Psychiatric/Behavioral: Negative.   All other systems reviewed and are negative.      Objective:    Physical Exam Vitals and nursing note reviewed.  Constitutional:      General: She is not in acute distress.    Appearance: Normal appearance. She is obese. She is not ill-appearing, toxic-appearing or diaphoretic.  Cardiovascular:     Rate and Rhythm: Normal rate and regular rhythm.  Pulmonary:     Effort: Pulmonary effort is normal. No respiratory distress.  Musculoskeletal:        General: No swelling, tenderness, deformity or signs of injury. Normal range of motion.     Right lower leg: No edema.     Left lower leg: No edema.  Skin:    General: Skin is warm and dry.     Capillary Refill: Capillary refill takes less than 2 seconds.     Coloration: Skin is not jaundiced or pale.     Findings: No bruising, erythema, lesion or rash.  Neurological:     General: No focal deficit present.     Mental Status: She is alert and oriented to  person, place, and time. Mental status is at baseline.     Cranial Nerves: No cranial nerve deficit.  Psychiatric:        Mood and Affect: Mood normal.        Behavior: Behavior normal.        Thought Content: Thought content normal.  Judgment: Judgment normal.     BP 132/84   Pulse 70   Temp 98 F (36.7 C) (Temporal)   Resp 14   Ht 5' (1.524 m)   Wt 183 lb 6.4 oz (83.2 kg)   SpO2 98%   BMI 35.82 kg/m  Wt Readings from Last 3 Encounters:  03/01/20 183 lb 6.4 oz (83.2 kg)  12/28/19 181 lb (82.1 kg)  06/28/19 182 lb 3.2 oz (82.6 kg)    There are no preventive care reminders to display for this patient.  There are no preventive care reminders to display for this patient.   Lab Results  Component Value Date   TSH 0.943 12/28/2019   Lab Results  Component Value Date   WBC 7.1 09/09/2018   HGB 15.8 09/09/2018   HCT 45.0 09/09/2018   MCV 95 09/09/2018   PLT 330 09/09/2018   Lab Results  Component Value Date   NA 139 12/28/2019   K 4.6 12/28/2019   CO2 22 12/28/2019   GLUCOSE 98 12/28/2019   BUN 19 12/28/2019   CREATININE 0.85 12/28/2019   BILITOT 0.6 12/28/2019   ALKPHOS 65 12/28/2019   AST 26 12/28/2019   ALT 33 (H) 12/28/2019   PROT 7.1 12/28/2019   ALBUMIN 4.8 12/28/2019   CALCIUM 10.1 12/28/2019   Lab Results  Component Value Date   CHOL 277 (H) 12/28/2019   Lab Results  Component Value Date   HDL 52 12/28/2019   Lab Results  Component Value Date   LDLCALC 183 (H) 12/28/2019   Lab Results  Component Value Date   TRIG 224 (H) 12/28/2019   Lab Results  Component Value Date   CHOLHDL 5.3 (H) 12/28/2019   Lab Results  Component Value Date   HGBA1C 5.6 09/09/2018       Assessment & Plan:   Problem List Items Addressed This Visit    None    Visit Diagnoses    Hip pain, chronic, right    -  Primary   Relevant Medications   gabapentin (NEURONTIN) 100 MG capsule   Other Relevant Orders   DG HIP UNILAT WITH PELVIS 2-3 VIEWS RIGHT  (Completed)   Low back pain, unspecified back pain laterality, unspecified chronicity, unspecified whether sciatica present       Relevant Medications   gabapentin (NEURONTIN) 100 MG capsule   Other Relevant Orders   DG Lumbar Spine Complete (Completed)   Ambulatory referral to Neurosurgery   Lumbar radiculopathy       Relevant Medications   gabapentin (NEURONTIN) 100 MG capsule   Other Relevant Orders   Ambulatory referral to Neurosurgery   Closed compression fracture of body of L1 vertebra (HCC)       Relevant Orders   Ambulatory referral to Neurosurgery       Meds ordered this encounter  Medications  . gabapentin (NEURONTIN) 100 MG capsule    Sig: Take 1 capsule (100 mg total) by mouth 3 (three) times daily. May take additional tablet before bed if needed.    Dispense:  90 capsule    Refill:  3    Order Specific Question:   Supervising Provider    Answer:   Doristine Bosworth [id number]   PLAN  Xray shows compression fx of L1 labeled as "old" - though pt fell 8 weeks ago, so unsure of true age.  Given hx, will refer to neurosurg for further work up  In the mean time, rest and gabapentin for relief  Pt  understands red flags and reasons to present to ED  Patient encouraged to call clinic with any questions, comments, or concerns.   Janeece Agee, NP

## 2020-03-11 ENCOUNTER — Ambulatory Visit: Payer: BC Managed Care – PPO | Admitting: Family Medicine

## 2020-04-03 ENCOUNTER — Ambulatory Visit (INDEPENDENT_AMBULATORY_CARE_PROVIDER_SITE_OTHER): Payer: BC Managed Care – PPO | Admitting: Bariatrics

## 2020-04-03 ENCOUNTER — Encounter (INDEPENDENT_AMBULATORY_CARE_PROVIDER_SITE_OTHER): Payer: Self-pay | Admitting: Bariatrics

## 2020-04-03 ENCOUNTER — Other Ambulatory Visit: Payer: Self-pay

## 2020-04-03 VITALS — BP 144/94 | HR 79 | Temp 98.1°F | Ht 60.0 in | Wt 177.0 lb

## 2020-04-03 DIAGNOSIS — R7303 Prediabetes: Secondary | ICD-10-CM | POA: Diagnosis not present

## 2020-04-03 DIAGNOSIS — E559 Vitamin D deficiency, unspecified: Secondary | ICD-10-CM

## 2020-04-03 DIAGNOSIS — Z0289 Encounter for other administrative examinations: Secondary | ICD-10-CM

## 2020-04-03 DIAGNOSIS — E038 Other specified hypothyroidism: Secondary | ICD-10-CM

## 2020-04-03 DIAGNOSIS — I10 Essential (primary) hypertension: Secondary | ICD-10-CM

## 2020-04-03 DIAGNOSIS — R5383 Other fatigue: Secondary | ICD-10-CM

## 2020-04-03 DIAGNOSIS — Z6834 Body mass index (BMI) 34.0-34.9, adult: Secondary | ICD-10-CM

## 2020-04-03 DIAGNOSIS — R0602 Shortness of breath: Secondary | ICD-10-CM | POA: Diagnosis not present

## 2020-04-03 DIAGNOSIS — Z1331 Encounter for screening for depression: Secondary | ICD-10-CM | POA: Diagnosis not present

## 2020-04-03 DIAGNOSIS — Z9189 Other specified personal risk factors, not elsewhere classified: Secondary | ICD-10-CM | POA: Diagnosis not present

## 2020-04-03 DIAGNOSIS — E669 Obesity, unspecified: Secondary | ICD-10-CM

## 2020-04-04 ENCOUNTER — Encounter (INDEPENDENT_AMBULATORY_CARE_PROVIDER_SITE_OTHER): Payer: Self-pay | Admitting: Bariatrics

## 2020-04-04 DIAGNOSIS — R7401 Elevation of levels of liver transaminase levels: Secondary | ICD-10-CM | POA: Insufficient documentation

## 2020-04-04 LAB — COMPREHENSIVE METABOLIC PANEL
ALT: 51 IU/L — ABNORMAL HIGH (ref 0–32)
AST: 28 IU/L (ref 0–40)
Albumin/Globulin Ratio: 2 (ref 1.2–2.2)
Albumin: 4.9 g/dL (ref 3.8–4.9)
Alkaline Phosphatase: 76 IU/L (ref 48–121)
BUN/Creatinine Ratio: 24 — ABNORMAL HIGH (ref 9–23)
BUN: 20 mg/dL (ref 6–24)
Bilirubin Total: 0.3 mg/dL (ref 0.0–1.2)
CO2: 24 mmol/L (ref 20–29)
Calcium: 9.8 mg/dL (ref 8.7–10.2)
Chloride: 102 mmol/L (ref 96–106)
Creatinine, Ser: 0.84 mg/dL (ref 0.57–1.00)
GFR calc Af Amer: 88 mL/min/{1.73_m2} (ref >59)
GFR calc non Af Amer: 76 mL/min/{1.73_m2} (ref >59)
Globulin, Total: 2.4 g/dL (ref 1.5–4.5)
Glucose: 86 mg/dL (ref 65–99)
Potassium: 4.5 mmol/L (ref 3.5–5.2)
Sodium: 140 mmol/L (ref 134–144)
Total Protein: 7.3 g/dL (ref 6.0–8.5)

## 2020-04-04 LAB — CBC WITH DIFFERENTIAL/PLATELET
Basophils Absolute: 0 10*3/uL (ref 0.0–0.2)
Basos: 1 %
EOS (ABSOLUTE): 0.2 10*3/uL (ref 0.0–0.4)
Eos: 2 %
Hematocrit: 46.2 % (ref 34.0–46.6)
Hemoglobin: 15.4 g/dL (ref 11.1–15.9)
Immature Grans (Abs): 0 10*3/uL (ref 0.0–0.1)
Immature Granulocytes: 0 %
Lymphocytes Absolute: 2.3 10*3/uL (ref 0.7–3.1)
Lymphs: 36 %
MCH: 32.6 pg (ref 26.6–33.0)
MCHC: 33.3 g/dL (ref 31.5–35.7)
MCV: 98 fL — ABNORMAL HIGH (ref 79–97)
Monocytes Absolute: 0.5 10*3/uL (ref 0.1–0.9)
Monocytes: 8 %
Neutrophils Absolute: 3.4 10*3/uL (ref 1.4–7.0)
Neutrophils: 53 %
Platelets: 300 10*3/uL (ref 150–450)
RBC: 4.73 x10E6/uL (ref 3.77–5.28)
RDW: 13.1 % (ref 11.7–15.4)
WBC: 6.4 10*3/uL (ref 3.4–10.8)

## 2020-04-04 LAB — HEMOGLOBIN A1C
Est. average glucose Bld gHb Est-mCnc: 117 mg/dL
Hgb A1c MFr Bld: 5.7 % — ABNORMAL HIGH (ref 4.8–5.6)

## 2020-04-04 LAB — INSULIN, RANDOM: INSULIN: 12 u[IU]/mL (ref 2.6–24.9)

## 2020-04-04 LAB — VITAMIN D 25 HYDROXY (VIT D DEFICIENCY, FRACTURES): Vit D, 25-Hydroxy: 20 ng/mL — ABNORMAL LOW (ref 30.0–100.0)

## 2020-04-04 NOTE — Progress Notes (Signed)
Chief Complaint:   OBESITY [name] (MR# [id number]) is a 59 y.o. female who presents for evaluation and treatment of obesity and related comorbidities. Current BMI is Body mass index is 34.57 kg/m. [name] has been struggling with her weight for many years and has been unsuccessful in either losing weight, maintaining weight loss, or reaching her healthy weight goal.  [name] is currently in the action stage of change and ready to dedicate time achieving and maintaining a healthier weight. [name] is interested in becoming our patient and working on intensive lifestyle modifications including (but not limited to) diet and exercise for weight loss.  [name] says she likes to [employer].  She enjoys sweets as well.  She states that boredom eating is her worst habit.  [name]'s habits were reviewed today and are as follows: Her family eats meals together, her desired weight loss is 30 pounds, she has been heavy most of her life, she started gaining weight after childbirth, her heaviest weight ever was 195 pounds, she craves sweets, she snacks frequently in the evenings, she skips breakfast sometimes, she is frequently drinking liquids with calories, she frequently makes poor food choices, she frequently eats larger portions than normal and she struggles with emotional eating.  Depression Screen [name]'s Food and Mood (modified PHQ-9) score was 4.  Depression screen PHQ 2/9 04/03/2020  Decreased Interest 1  Down, Depressed, Hopeless 1  PHQ - 2 Score 2  Altered sleeping 0  Tired, decreased energy 0  Change in appetite 1  Feeling bad or failure about yourself  1  Trouble concentrating 0  Moving slowly or fidgety/restless 0  Suicidal thoughts 0  PHQ-9 Score 4  Difficult doing work/chores Not difficult at all   Subjective:   1. Other fatigue Jeania denies daytime somnolence and denies waking up still tired. Patent has a history of symptoms of snoring. [name] generally  gets 8 hours of sleep per night, and states that she has generally restful sleep. Snoring is present. Apneic episodes are not present. Epworth Sleepiness Score is 5.  2. SOB (shortness of breath) on exertion [name] notes increasing shortness of breath with exercising and seems to be worsening over time with weight gain. She notes getting out of breath sooner with activity than she used to. This has gotten worse recently. [name] denies shortness of breath at rest or orthopnea.  3. Pre-diabetes [name] has a diagnosis of prediabetes based on her elevated HgA1c and was informed this puts her at greater risk of developing diabetes. She continues to work on diet and exercise to decrease her risk of diabetes. She denies nausea or hypoglycemia.  She is not on any medication.  Lab Results  Component Value Date   HGBA1C 5.7 (H) 04/03/2020   Lab Results  Component Value Date   INSULIN 12.0 04/03/2020   4. Other specified hypothyroidism [place]ker[place] is taking levothyroxine 112 mcg daily.  Lab Results  Component Value Date   TSH 0.943 12/28/2019   5. Essential hypertension Review: taking medications as instructed, no medication side effects noted, no chest pain on exertion, no dyspnea on exertion, no swelling of ankles.  Blood pressure is elevated today.  She is taking enalapril.  BP Readings from Last 3 Encounters:  04/03/20 (!) 144/94  03/01/20 132/84  12/28/19 132/85   6. Vitamin D deficiency She is currently taking no vitamin D supplement.   7. At risk for diabetes mellitus [name] is at higher than average risk for developing diabetes due to  her obesity and prediabetes diagnosis.   8. Depression screening [name] was screened for depression as part of her new patient workup.  Assessment/Plan:   1. Other fatigue [name] does not feel that her weight is causing her energy to be lower than it should be. Fatigue may be related to obesity, depression or many other causes. Labs will  be ordered, and in the meanwhile, [name] will focus on self care including making healthy food choices, increasing physical activity and focusing on stress reduction. - EKG 12-Lead - Comprehensive metabolic panel - CBC with Differential/Platelet  2. SOB (shortness of breath) on exertion [name] does feel that she gets out of breath more easily that she used to when she exercises. [name]'s shortness of breath appears to be obesity related and exercise induced. She has agreed to work on weight loss and gradually increase exercise to treat her exercise induced shortness of breath. Will continue to monitor closely.  3. Pre-diabetes [name] will continue to work on weight loss, exercise, and decreasing simple carbohydrates to help decrease the risk of diabetes.  [name] will decrease carbohydrates and increase healthy protein and fats in her diet. - Hemoglobin A1c - Insulin, random  4. Other specified hypothyroidism Patient with long-standing hypothyroidism, on levothyroxine therapy. She appears euthyroid. Orders and follow up as documented in patient record.  Counseling . Good thyroid control is important for overall health. Supratherapeutic thyroid levels are dangerous and will not improve weight loss results. . The correct way to take levothyroxine is fasting, with water, separated by at least 30 minutes from breakfast, and separated by more than 4 hours from calcium, iron, multivitamins, acid reflux medications (PPIs).   5. Essential hypertension Nyelle is working on healthy weight loss and exercise to improve blood pressure control. We will watch for signs of hypotension as she continues her lifestyle modifications.  6. Vitamin D deficiency Low Vitamin D level contributes to fatigue and are associated with obesity, breast, and colon cancer.  - VITAMIN D 25 Hydroxy (Vit-D Deficiency, Fractures)  7. At risk for diabetes mellitus [name] was given approximately 15 minutes of  diabetes education and counseling today. We discussed intensive lifestyle modifications today with an emphasis on weight loss as well as increasing exercise and decreasing simple carbohydrates in her diet. We also reviewed medication options with an emphasis on risk versus benefit of those discussed.   Repetitive spaced learning was employed today to elicit superior memory formation and behavioral change.  8. Depression screening Depression screen was negative today.  PHQ-9 was 4.  9. Class 1 obesity with serious comorbidity and body mass index (BMI) of 34.0 to 34.9 in adult, unspecified obesity type [name] is currently in the action stage of change and her goal is to continue with weight loss efforts. I recommend [name] begin the structured treatment plan as follows:  She has agreed to the Category 2 Plan.  [name] will work on meal planning, intentional eating, and calorie and protein goals for each meal.  Independently reviewed the labs from 12/28/2019 (CMP, lipids, glucose, TSH).   Exercise goals: Deep water ergonomics.   Behavioral modification strategies: increasing lean protein intake, decreasing simple carbohydrates, increasing vegetables, increasing water intake, decreasing eating out, no skipping meals, meal planning and cooking strategies, keeping healthy foods in the home and planning for success.  She was informed of the importance of frequent follow-up visits to maximize her success with intensive lifestyle modifications for her multiple health conditions. She was informed we would discuss her lab results  at her next visit unless there is a critical issue that needs to be addressed sooner. [name] agreed to keep her next visit at the agreed upon time to discuss these results.  Objective:   Blood pressure (!) 144/94, pulse 79, temperature 98.1 F (36.7 C), height 5' (1.524 m), weight 177 lb (80.3 kg), SpO2 95 %. Body mass index is 34.57 kg/m.  EKG: Normal sinus rhythm,  rate 70 bpm.  Indirect Calorimeter completed today shows a VO2 of 232 and a REE of 1617.  Her calculated basal metabolic rate is 5400 thus her basal metabolic rate is better than expected.  General: Cooperative, alert, well developed, in no acute distress. HEENT: Conjunctivae and lids unremarkable. Cardiovascular: Regular rhythm.  Lungs: Normal work of breathing. Neurologic: No focal deficits.   Lab Results  Component Value Date   CREATININE 0.84 04/03/2020   BUN 20 04/03/2020   NA 140 04/03/2020   K 4.5 04/03/2020   CL 102 04/03/2020   CO2 24 04/03/2020   Lab Results  Component Value Date   ALT 51 (H) 04/03/2020   AST 28 04/03/2020   ALKPHOS 76 04/03/2020   BILITOT 0.3 04/03/2020   Lab Results  Component Value Date   HGBA1C 5.7 (H) 04/03/2020   HGBA1C 5.6 09/09/2018   HGBA1C 5.9 (H) 10/22/2016   HGBA1C CANCELED 10/22/2016   Lab Results  Component Value Date   INSULIN 12.0 04/03/2020   Lab Results  Component Value Date   TSH 0.943 12/28/2019   Lab Results  Component Value Date   CHOL 277 (H) 12/28/2019   HDL 52 12/28/2019   LDLCALC 183 (H) 12/28/2019   TRIG 224 (H) 12/28/2019   CHOLHDL 5.3 (H) 12/28/2019   Lab Results  Component Value Date   WBC 6.4 04/03/2020   HGB 15.4 04/03/2020   HCT 46.2 04/03/2020   MCV 98 (H) 04/03/2020   PLT 300 04/03/2020   Attestation Statements:   Reviewed by clinician on day of visit: allergies, medications, problem list, medical history, surgical history, family history, social history, and previous encounter notes.  I, Water quality scientist, CMA, am acting as Location manager for [employer], DO.  I have reviewed the above documentation for accuracy and completeness, and I agree with the above. Jearld Lesch, DO

## 2020-04-08 ENCOUNTER — Encounter (INDEPENDENT_AMBULATORY_CARE_PROVIDER_SITE_OTHER): Payer: Self-pay | Admitting: Bariatrics

## 2020-04-17 ENCOUNTER — Encounter (INDEPENDENT_AMBULATORY_CARE_PROVIDER_SITE_OTHER): Payer: Self-pay | Admitting: Bariatrics

## 2020-04-17 ENCOUNTER — Other Ambulatory Visit: Payer: Self-pay

## 2020-04-17 ENCOUNTER — Ambulatory Visit (INDEPENDENT_AMBULATORY_CARE_PROVIDER_SITE_OTHER): Payer: BC Managed Care – PPO | Admitting: Bariatrics

## 2020-04-17 VITALS — BP 115/73 | HR 69 | Temp 98.5°F | Ht 60.0 in | Wt 171.0 lb

## 2020-04-17 DIAGNOSIS — R7401 Elevation of levels of liver transaminase levels: Secondary | ICD-10-CM | POA: Diagnosis not present

## 2020-04-17 DIAGNOSIS — R7303 Prediabetes: Secondary | ICD-10-CM

## 2020-04-17 DIAGNOSIS — Z9189 Other specified personal risk factors, not elsewhere classified: Secondary | ICD-10-CM

## 2020-04-17 DIAGNOSIS — E559 Vitamin D deficiency, unspecified: Secondary | ICD-10-CM

## 2020-04-17 DIAGNOSIS — Z6833 Body mass index (BMI) 33.0-33.9, adult: Secondary | ICD-10-CM

## 2020-04-17 DIAGNOSIS — E669 Obesity, unspecified: Secondary | ICD-10-CM

## 2020-04-17 MED ORDER — VITAMIN D (ERGOCALCIFEROL) 1.25 MG (50000 UNIT) PO CAPS: 50000.0000 [IU] | ORAL_CAPSULE | ORAL | 0 refills | Status: DC

## 2020-04-18 ENCOUNTER — Encounter (INDEPENDENT_AMBULATORY_CARE_PROVIDER_SITE_OTHER): Payer: Self-pay | Admitting: Bariatrics

## 2020-04-18 NOTE — Progress Notes (Signed)
Chief Complaint:   OBESITY [name] is here to discuss her progress with her obesity treatment plan along with follow-up of her obesity related diagnoses. [name] is on the Category 2 Plan and states she is following her eating plan approximately 90% of the time. [name] states she is exercising 0 minutes 0 times per week.  Today's visit was #: 2 Starting weight: 177 lbs Starting date: 04/03/2020 Today's weight: 171 lbs Today's date: 04/17/2020 Total lbs lost to date: 6 Total lbs lost since last in-office visit: 6  Interim History: [name] is down 6 lbs. She has not been that hungry and is not always eating the snacks. She has not been exercising that much.  Subjective:   Vitamin D deficiency. [name] is not on Vitamin D supplementation. Last Vitamin D was 20.0 on 04/03/2020.  Prediabetes. [name] has a diagnosis of prediabetes based on her elevated HgA1c and was informed this puts her at greater risk of developing diabetes. She continues to work on diet and exercise to decrease her risk of diabetes. She denies nausea or hypoglycemia. [name] is on no medication.  Lab Results  Component Value Date   HGBA1C 5.7 (H) 04/03/2020   Lab Results  Component Value Date   INSULIN 12.0 04/03/2020   Elevated alanine aminotransferase (ALT) level. [name] endorses lower abdominal pain. She takes no regular Tylenol and has minimal ETOH intake. ALT slightly elevated at 51 on 04/03/2020.  At risk for osteoporosis. [name] is at higher risk of osteopenia and osteoporosis due to Vitamin D deficiency.   Assessment/Plan:   Vitamin D deficiency. Low Vitamin D level contributes to fatigue and are associated with obesity, breast, and colon cancer. She was given a prescription for Vitamin D, Ergocalciferol, (DRISDOL) 1.25 MG (50000 UNIT) CAPS capsule every week #4 with 0 refills and will follow-up for routine testing of Vitamin D, at least 2-3 times per year to avoid  over-replacement.    Prediabetes. [name] will continue to work on weight loss, exercise, increasing healthy fats and protein, and decreasing simple carbohydrates to help decrease the risk of diabetes.   Elevated alanine aminotransferase (ALT) level. Will watch over time.  At risk for osteoporosis. [name] was given approximately 15 minutes of osteoporosis prevention counseling today. [name] is at risk for osteopenia and osteoporosis due to her Vitamin D deficiency. She was encouraged to take her Vitamin D and follow her higher calcium diet and increase strengthening exercise to help strengthen her bones and decrease her risk of osteopenia and osteoporosis.  Repetitive spaced learning was employed today to elicit superior memory formation and behavioral change.  Class 1 obesity with serious comorbidity and body mass index (BMI) of 33.0 to 33.9 in adult, unspecified obesity type.  [name] is currently in the action stage of change. As such, her goal is to continue with weight loss efforts. She has agreed to the Category 2 Plan.   She will work on meal planning and intentional eating. Handout was given on Lunch and Dinner options.  We independently reviewed with the patient labs from 04/03/2020 including CMP, Vitamin D, CBC, and A1c.  Exercise goals: [name] will continue to walk and [name] strength training 3 times a week.  Behavioral modification strategies: increasing lean protein intake, decreasing simple carbohydrates, increasing vegetables, increasing water intake, decreasing eating out, no skipping meals, meal planning and cooking strategies, keeping healthy foods in the home and planning for success.  [name] has agreed to follow-up with our clinic in 2-3 weeks. She  was informed of the importance of frequent follow-up visits to maximize her success with intensive lifestyle modifications for her multiple health conditions.   Objective:   Blood pressure 115/73, pulse 69, temperature  98.5 F (36.9 C), height 5' (1.524 m), weight 171 lb (77.6 kg), SpO2 95 %. Body mass index is 33.4 kg/m.  General: Cooperative, alert, well developed, in no acute distress. HEENT: Conjunctivae and lids unremarkable. Cardiovascular: Regular rhythm.  Lungs: Normal work of breathing. Neurologic: No focal deficits.   Lab Results  Component Value Date   CREATININE 0.84 04/03/2020   BUN 20 04/03/2020   NA 140 04/03/2020   K 4.5 04/03/2020   CL 102 04/03/2020   CO2 24 04/03/2020   Lab Results  Component Value Date   ALT 51 (H) 04/03/2020   AST 28 04/03/2020   ALKPHOS 76 04/03/2020   BILITOT 0.3 04/03/2020   Lab Results  Component Value Date   HGBA1C 5.7 (H) 04/03/2020   HGBA1C 5.6 09/09/2018   HGBA1C 5.9 (H) 10/22/2016   HGBA1C CANCELED 10/22/2016   Lab Results  Component Value Date   INSULIN 12.0 04/03/2020   Lab Results  Component Value Date   TSH 0.943 12/28/2019   Lab Results  Component Value Date   CHOL 277 (H) 12/28/2019   HDL 52 12/28/2019   LDLCALC 183 (H) 12/28/2019   TRIG 224 (H) 12/28/2019   CHOLHDL 5.3 (H) 12/28/2019   Lab Results  Component Value Date   WBC 6.4 04/03/2020   HGB 15.4 04/03/2020   HCT 46.2 04/03/2020   MCV 98 (H) 04/03/2020   PLT 300 04/03/2020   No results found for: IRON, TIBC, FERRITIN  Attestation Statements:   Reviewed by clinician on day of visit: allergies, medications, problem list, medical history, surgical history, family history, social history, and previous encounter notes.  [name], am acting as Location manager for [employer], [name]   I have reviewed the above documentation for accuracy and completeness, and I agree with the above. Deanna Lesch, [name]

## 2020-04-25 ENCOUNTER — Encounter: Payer: Self-pay | Admitting: Family Medicine

## 2020-04-25 ENCOUNTER — Ambulatory Visit (INDEPENDENT_AMBULATORY_CARE_PROVIDER_SITE_OTHER): Payer: BC Managed Care – PPO | Admitting: Family Medicine

## 2020-04-25 ENCOUNTER — Other Ambulatory Visit: Payer: Self-pay

## 2020-04-25 VITALS — BP 128/84 | HR 70 | Temp 97.5°F | Ht 60.0 in | Wt 174.0 lb

## 2020-04-25 DIAGNOSIS — H9192 Unspecified hearing loss, left ear: Secondary | ICD-10-CM

## 2020-04-25 DIAGNOSIS — H6692 Otitis media, unspecified, left ear: Secondary | ICD-10-CM

## 2020-04-25 DIAGNOSIS — H61892 Other specified disorders of left external ear: Secondary | ICD-10-CM

## 2020-04-25 DIAGNOSIS — H9202 Otalgia, left ear: Secondary | ICD-10-CM | POA: Diagnosis not present

## 2020-04-25 MED ORDER — NEOMYCIN-POLYMYXIN-HC 3.5-10000-1 OT SOLN: 3.0000 [drp] | Freq: Three times a day (TID) | OTIC | 0 refills | Status: DC

## 2020-04-25 MED ORDER — AMOXICILLIN 875 MG PO TABS: 875.0000 mg | ORAL_TABLET | Freq: Two times a day (BID) | ORAL | 0 refills | Status: DC

## 2020-04-25 NOTE — Progress Notes (Signed)
Subjective:  Patient ID: [name], female    DOB: Jul 25, 1961  Age: 59 y.o. MRN: [id number]  CC:  Chief Complaint  Patient presents with   Ear Pain    started 2 weeks ago. pt states she has pain in her L ear. pt states she has drainage in her throat and having some drainage coming from her ear. fluid seems clear, but in the morning the pt states she will have white crust around her L ear. pt hasn't tried any mediocation other than alergy medication.    HPI [name] presents for    L ear pain: Pressure feeling past few weeks.  Last few nights wet feeling and discharge - clear/creamy discharge with crust in am.  More sore past few days.  No fever.  postnasal drip past few weeks, more past few days.  No sinus pressure/congestion. No cough. Eating/drinking/swallowing ok. No sore throat. Muffled hearing past few weeks on left.  Wears earbuds on phone or computer. r side ok.  Pool swimming, but no head under water. No fresh/saltwater exposure.  Some clicking/popping in both ears at times. No nasal sprays.   Tx: none.   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   Blood transfusion without reported diagnosis    post-operatively in 20s after L1 fracture with spinal cord injury after horseback riding.   Chronic kidney disease    Tuberous Sclerosis; [facility] at [facility].   Hyperlipidemia    Hypertension    Hypothyroidism    Lower back pain    Thyroid disease    Past Surgical History:  Procedure Laterality Date   ABDOMINAL HYSTERECTOMY     DUB; ovaries remaining.     CESAREAN SECTION     MENISCUS REPAIR     Sleep Study  12/17/2010   No sleep disordered breathing or periodic limb mvmets.  Excess light sleep.   SPINE SURGERY     L1 fracture with spinal cord injury with horseback riding.   TUBAL LIGATION     Allergies  Allergen Reactions   Sulfa Antibiotics Hives   Lipitor [Atorvastatin] Other (See Comments)    Fatigue and confusion   Prior to Admission medications   Medication Sig Start Date End Date Taking? Authorizing Provider  enalapril (VASOTEC) 20 MG tablet Take 1 tablet (20 mg total) by mouth daily. 06/28/19  Yes [name], MD  levothyroxine (SYNTHROID) 112 MCG tablet Take 1 tablet (112 mcg total) by mouth daily before breakfast. 06/28/19  Yes [name], MD  Vitamin D, Ergocalciferol, (DRISDOL) 1.25 MG (50000 UNIT) CAPS capsule Take 1 capsule (50,000 Units total) by mouth every 7 (seven) days. 04/17/20  Yes Roswell Nickel, DO   Social History   Socioeconomic History   Marital status: Married    Spouse name: Burnis Kingfisher   Number of children: Not on file   Years of education: Not on file   Highest education level: Not on file  Occupational History   Occupation: Tourist information centre manager  Tobacco Use   Smoking status: Never Smoker   Smokeless tobacco: Never Used  Substance and Sexual  Activity   Alcohol use: Yes    Comment: occ   Drug use: No   Sexual activity: Yes    Birth control/protection: None  Other Topics Concern   Not on file  Social History Narrative   Marital status: married x 20+ years; happily married.      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      Tobacco; none      Alcohol:  Socially      Drugs: none      Exercise: sporadic   Social Determinants of Health   Financial Resource Strain:    Difficulty of Paying Living Expenses:   Food Insecurity:    Worried About Charity fundraiser in the Last Year:     Arboriculturist in the Last Year:   Transportation Needs:    Film/video editor (Medical):    Lack of Transportation (Non-Medical):   Physical Activity:    Days of Exercise per Week:    Minutes of Exercise per Session:   Stress:    Feeling of Stress :   Social Connections:    Frequency of Communication with Friends and Family:    Frequency of Social Gatherings with Friends and Family:    Attends Religious Services:    Active Member of Clubs or Organizations:    Attends Archivist Meetings:    Marital Status:   Intimate Partner Violence:    Fear of Current or Ex-Partner:    Emotionally Abused:    Physically Abused:    Sexually Abused:     Review of Systems   Objective:   Vitals:   04/25/20 1645  BP: 128/84  Pulse: 70  Temp: (!) 97.5 F (36.4 C)  TempSrc: Temporal  SpO2: 95%  Weight: 174 lb (78.9 kg)  Height: 5' (1.524 m)     Physical Exam Vitals reviewed.  HENT:     Right Ear: External ear normal. Decreased hearing noted. Tympanic membrane is not perforated.     Ears:     Weber exam findings: lateralizes right.    Right Rinne: AC > BC.    Comments: Left canal: Small excoriated area at posterior entrance of canal. No canal edema. Min erythema. No discharge.  Dull TM with small amount discolored fluid at base.  No perf or fluid in canal.     Nose:     Right Sinus: No maxillary sinus tenderness or frontal sinus tenderness.     Left Sinus: No maxillary sinus tenderness or frontal sinus tenderness.  Neck:   Lymphadenopathy:     Cervical: No cervical adenopathy.      Assessment & Plan:  [name] is a 59 y.o. female . Left ear pain  Irritation of external auditory canal, left - Plan: neomycin-polymyxin-hydrocortisone (CORTISPORIN) OTIC solution  Left otitis media, unspecified otitis media type - Plan: amoxicillin (AMOXIL) 875 MG tablet  Decreased hearing, left   Multiple contributors including component of  eustachian tube dysfunction, early otitis media with irritation of external canal.  Did not appreciate significant canal edema, but mild otitis externa possible with previous discharge, irritation and worsening past 3 days.  -Start amoxicillin, potential side effects discussed  -Cortisporin otic for external irritation.  -Decreased hearing, but 3 weeks of symptoms and mixed testing as above.  Check in 4 days, consider ENT eval.    Meds  ordered this encounter  Medications   amoxicillin (AMOXIL) 875 MG tablet    Sig: Take 1 tablet (875 mg total) by mouth 2 (two) times daily.    Dispense:  20 tablet    Refill:  0   neomycin-polymyxin-hydrocortisone (CORTISPORIN) OTIC solution    Sig: Place 3 drops into the left ear 3 (three) times daily.    Dispense:  10 mL    Refill:  0   Patient Instructions    Start antibiotics for possible early middle ear infection, and drops for the irritation of the canal.  Recheck with me Monday or Wednesday of next week.  If not improving can certainly have you see ear nose and throat.  Okay to try Afrin nasal spray over-the-counter for a day or 2 to see if that will help with some of the pressure sensation and possible eustachian tube dysfunction.  Return to the clinic or go to the nearest emergency room if any of your symptoms worsen or new symptoms occur.   If you have lab work done today you will be contacted with your lab results within the next 2 weeks.  If you have not heard from [place] then please contact us. The fastest way to get your results is to register for My Chart.   IF you received an x-ray today, you will receive an invoice from [facility]. Please contact [facility] at [phone] with questions or concerns regarding your invoice.   IF you received labwork today, you will receive an invoice from [place]. Please contact [facility] at [phone] with questions or concerns regarding your invoice.   Our billing staff will not be  able to assist you with questions regarding bills from these companies.  You will be contacted with the lab results as soon as they are available. The fastest way to get your results is to activate your My Chart account. Instructions are located on the last page of this paperwork. If you have not heard from [place] regarding the results in 2 weeks, please contact this office.         Signed, Meredith Staggers, MD [facility]

## 2020-04-25 NOTE — Patient Instructions (Addendum)
  Start antibiotics for possible early middle ear infection, and drops for the irritation of the canal.  Recheck with me Monday or Wednesday of next week.  If not improving can certainly have you see ear nose and throat.  Okay to try Afrin nasal spray over-the-counter for a day or 2 to see if that will help with some of the pressure sensation and possible eustachian tube dysfunction.  Return to the clinic or go to the nearest emergency room if any of your symptoms worsen or new symptoms occur.   If you have lab work done today you will be contacted with your lab results within the next 2 weeks.  If you have not heard from [place] then please contact us. The fastest way to get your results is to register for My Chart.   IF you received an x-ray today, you will receive an invoice from [facility]. Please contact [facility] at [phone] with questions or concerns regarding your invoice.   IF you received labwork today, you will receive an invoice from [place]. Please contact [facility] at [phone] with questions or concerns regarding your invoice.   Our billing staff will not be able to assist you with questions regarding bills from these companies.  You will be contacted with the lab results as soon as they are available. The fastest way to get your results is to activate your My Chart account. Instructions are located on the last page of this paperwork. If you have not heard from [place] regarding the results in 2 weeks, please contact this office.

## 2020-04-29 ENCOUNTER — Ambulatory Visit: Payer: BC Managed Care – PPO | Admitting: Family Medicine

## 2020-04-29 ENCOUNTER — Other Ambulatory Visit: Payer: Self-pay

## 2020-04-29 ENCOUNTER — Encounter: Payer: Self-pay | Admitting: Family Medicine

## 2020-04-29 VITALS — BP 130/83 | HR 69 | Temp 98.6°F | Ht 60.0 in | Wt 175.0 lb

## 2020-04-29 DIAGNOSIS — H9202 Otalgia, left ear: Secondary | ICD-10-CM | POA: Diagnosis not present

## 2020-04-29 DIAGNOSIS — H6692 Otitis media, unspecified, left ear: Secondary | ICD-10-CM

## 2020-04-29 DIAGNOSIS — H61892 Other specified disorders of left external ear: Secondary | ICD-10-CM | POA: Diagnosis not present

## 2020-04-29 NOTE — Progress Notes (Signed)
Subjective:  Patient ID: [name], female    DOB: 1961/02/01  Age: 59 y.o. MRN: [id number]  CC:  Chief Complaint  Patient presents with  . Follow-up    from L ear pain on 04/25/2020. pt states the pain is gone, but she still has the drainage. pt is unsure if the hearing in the L is is better, but the pt states it isn't any worst. pt reports taking her medication as prescribed with no known side effects.    HPI [name] presents for   Left ear pain: Follow-up from 4 days ago.  Treated with amoxicillin for possible early otitis media as well as Cortisporin otic for possible component of otitis externa with itching and irritation.  Thought to also have possible eustachian tube dysfunction component with popping sensation. Treated with Afrin for few days - better when on afrin - 4 doses only.   Some decreased hearing in that ear, but testing was mixed for sensorineural versus conductive loss.  Hearing changes have been present for approximately 3 weeks already. ni clicking in ears both sides.   Ear feels better. No further pain. Min drainage feeling. Min decreased hearing  - does not notice with routine activity- just with use of earbuds.   No fever, no blood from ear or discharge seen.   History Patient Active Problem List   Diagnosis Date Noted  . Elevated ALT measurement 04/04/2020  . Prediabetes 06/09/2017  . 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 Hyperlipidemia 10/21/2016  . Obesity 02/15/2014  . 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 Hypothyroid 06/10/2012  . HTN (hypertension) 06/10/2012   Past Medical History:  Diagnosis Date  . Allergy    dust mite allergies; Zyrtec.  [name] Blood transfusion without reported diagnosis    post-operatively in 20s after L1 fracture with spinal cord injury after horseback riding.  . Chronic kidney disease    Tuberous Sclerosis; [facility] at [facility].  [name] Hyperlipidemia   . Hypertension   .  Hypothyroidism   . Lower back pain   . Thyroid disease    Past Surgical History:  Procedure Laterality Date  . ABDOMINAL HYSTERECTOMY     DUB; ovaries remaining.  [name] CESAREAN SECTION    . MENISCUS REPAIR    . Sleep Study  12/17/2010   No sleep disordered breathing or periodic limb mvmets.  Excess light sleep.  [name] SPINE SURGERY     L1 fracture with spinal cord injury with horseback riding.  . TUBAL LIGATION     Allergies  Allergen Reactions  . Sulfa Antibiotics Hives  . Lipitor [Atorvastatin] Other (See Comments)    Fatigue and confusion   Prior to Admission medications   Medication Sig Start Date End Date Taking? Authorizing Provider  amoxicillin (AMOXIL) 875 MG tablet Take 1 tablet (875 mg total) by mouth 2 (two) times daily. 04/25/20  Yes [name], MD  enalapril (VASOTEC) 20 MG tablet Take 1 tablet (20 mg total) by mouth daily. 06/28/19  Yes [name], MD  levothyroxine (SYNTHROID) 112 MCG tablet Take 1 tablet (112 mcg total) by mouth daily before breakfast. 06/28/19  Yes [name], MD  neomycin-polymyxin-hydrocortisone (CORTISPORIN) OTIC solution Place 3 drops into the left ear 3 (three) times daily. 04/25/20  Yes [name], MD  Vitamin D, Ergocalciferol, (DRISDOL) 1.25 MG (50000 UNIT) CAPS capsule Take 1 capsule (50,000 Units total) by mouth every 7 (seven) days. 04/17/20  Yes  Georgia Lopes, DO   Social History   Socioeconomic History  . Marital status: Married    Spouse name: Gerrit Friends  . Number of children: Not on file  . Years of education: Not on file  . Highest education level: Not on file  Occupational History  . Occupation: Engineer, technical sales  Tobacco Use  . Smoking status: Never Smoker  . Smokeless tobacco: Never Used  Substance and Sexual Activity  . Alcohol use: Yes    Comment: occ  . Drug use: No  . Sexual activity: Yes    Birth control/protection: None  Other Topics Concern  . Not on file  Social History Narrative   Marital  status: married x 20+ years; happily married.      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      Tobacco; none      Alcohol:  Socially      Drugs: none      Exercise: sporadic   Social Determinants of Health   Financial Resource Strain:   . Difficulty of Paying Living Expenses:   Food Insecurity:   . Worried About Charity fundraiser in the Last Year:   . Arboriculturist in the Last Year:   Transportation Needs:   . Film/video editor (Medical):   [name] Lack of Transportation (Non-Medical):   Physical Activity:   . Days of Exercise per Week:   . Minutes of Exercise per Session:   Stress:   . Feeling of Stress :   Social Connections:   . Frequency of Communication with Friends and Family:   . Frequency of Social Gatherings with Friends and Family:   . Attends Religious Services:   . Active Member of Clubs or Organizations:   . Attends Archivist Meetings:   [name] Marital Status:   Intimate Partner Violence:   . Fear of Current or Ex-Partner:   . Emotionally Abused:   [name] Physically Abused:   . Sexually Abused:     Review of Systems  Per HPI   Objective:   Vitals:   04/29/20 1602  BP: 130/83  Pulse: 69  Temp: 98.6 F (37 C)  TempSrc: Temporal  SpO2: 97%  Weight: 175 lb (79.4 kg)  Height: 5' (1.524 m)    Physical Exam Constitutional:      General: She is not in acute distress.    Appearance: She is well-developed.  HENT:     Head: Normocephalic and atraumatic.     Right Ear: Hearing and tympanic membrane normal.     Left Ear: Hearing normal.     Ears:     Weber exam findings: does not lateralize.    Left Rinne: AC > BC.    Comments: Min dull left TM, canal clear. Hearing intact with finger rub bilaterally.   Cardiovascular:     Rate and Rhythm: Normal rate and regular rhythm.  Pulmonary:     Effort: Pulmonary effort is normal.     Breath sounds: Normal breath  sounds. No wheezing or rales.  Neurological:     Mental Status: She is alert and oriented to person, place, and time.     Assessment & Plan:  [name] is a 59 y.o. female . Left ear pain  Left otitis media, unspecified otitis media type  Irritation of external auditory canal, left   -  Improved.  Hearing testing improved today as well.  May have component of eustachian tube dysfunction, okay to continue Afrin for 1 or 2 days, but trial of Flonase also discussed.  Continue antibiotics from last visit with RTC precautions.  No orders of the defined types were placed in this encounter.  Patient Instructions    Ok to use afrin up to 2 days, then 3-4 day break. Try flonase once in each nostril at bedtime. Continue meds from last visit.  If hearing is not continuing to improve in next week - let me know and I will refer you back to ENT.   Return to the clinic or go to the nearest emergency room if any of your symptoms worsen or new symptoms occur.   If you have lab work done today you will be contacted with your lab results within the next 2 weeks.  If you have not heard from [place] then please contact us. The fastest way to get your results is to register for My Chart.   IF you received an x-ray today, you will receive an invoice from [facility]. Please contact [facility] at [phone] with questions or concerns regarding your invoice.   IF you received labwork today, you will receive an invoice from [place]. Please contact [facility] at [phone] with questions or concerns regarding your invoice.   Our billing staff will not be able to assist you with questions regarding bills from these companies.  You will be contacted with the lab results as soon as they are available. The fastest way to get your results is to activate your My Chart account. Instructions are located on the last page of this paperwork. If you have not heard from [place] regarding the results in  2 weeks, please contact this office.         Signed, Meredith Staggers, MD [facility]

## 2020-04-29 NOTE — Patient Instructions (Addendum)
  Ok to use afrin up to 2 days, then 3-4 day break. Try flonase once in each nostril at bedtime. Continue meds from last visit.  If hearing is not continuing to improve in next week - let me know and I will refer you back to ENT.   Return to the clinic or go to the nearest emergency room if any of your symptoms worsen or new symptoms occur.   If you have lab work done today you will be contacted with your lab results within the next 2 weeks.  If you have not heard from [place] then please contact us. The fastest way to get your results is to register for My Chart.   IF you received an x-ray today, you will receive an invoice from [facility]. Please contact [facility] at [phone] with questions or concerns regarding your invoice.   IF you received labwork today, you will receive an invoice from [facility]. Please contact [facility] at [phone] with questions or concerns regarding your invoice.   Our billing staff will not be able to assist you with questions regarding bills from these companies.  You will be contacted with the lab results as soon as they are available. The fastest way to get your results is to activate your My Chart account. Instructions are located on the last page of this paperwork. If you have not heard from [place] regarding the results in 2 weeks, please contact this office.

## 2020-05-14 ENCOUNTER — Other Ambulatory Visit: Payer: Self-pay

## 2020-05-14 ENCOUNTER — Encounter (INDEPENDENT_AMBULATORY_CARE_PROVIDER_SITE_OTHER): Payer: Self-pay | Admitting: Bariatrics

## 2020-05-14 ENCOUNTER — Ambulatory Visit (INDEPENDENT_AMBULATORY_CARE_PROVIDER_SITE_OTHER): Payer: BC Managed Care – PPO | Admitting: Bariatrics

## 2020-05-14 VITALS — BP 139/85 | HR 68 | Temp 98.1°F | Ht 60.0 in | Wt 169.0 lb

## 2020-05-14 DIAGNOSIS — Z9189 Other specified personal risk factors, not elsewhere classified: Secondary | ICD-10-CM | POA: Diagnosis not present

## 2020-05-14 DIAGNOSIS — Z6833 Body mass index (BMI) 33.0-33.9, adult: Secondary | ICD-10-CM

## 2020-05-14 DIAGNOSIS — R7303 Prediabetes: Secondary | ICD-10-CM

## 2020-05-14 DIAGNOSIS — E669 Obesity, unspecified: Secondary | ICD-10-CM | POA: Diagnosis not present

## 2020-05-14 DIAGNOSIS — E559 Vitamin D deficiency, unspecified: Secondary | ICD-10-CM

## 2020-05-14 MED ORDER — VITAMIN D (ERGOCALCIFEROL) 1.25 MG (50000 UNIT) PO CAPS: 50000.0000 [IU] | ORAL_CAPSULE | ORAL | 0 refills | Status: DC

## 2020-05-14 NOTE — Progress Notes (Signed)
Chief Complaint:   OBESITY [name] is here to discuss her progress with her obesity treatment plan along with follow-up of her obesity related diagnoses. [name] is on the Category 2 Plan and states she is following her eating plan approximately 50% of the time. Torre states she is doing water aerobics 30 minutes 4 times per week and strength training 35 minutes 2-3 times per week.  Today's visit was #: 3 Starting weight: 177 lbs Starting date: 04/03/2020 Today's weight: 169 lbs Today's date: 05/14/2020 Total lbs lost to date: 8 Total lbs lost since last in-office visit: 2  Interim History: [name] is down 2 lbs and has done well overall. She has been on vacation and is doing well.  Subjective:   Pre-diabetes. [name] has a diagnosis of prediabetes based on her elevated HgA1c and was informed this puts her at greater risk of developing diabetes. She continues to work on diet and exercise to decrease her risk of diabetes. She denies nausea or hypoglycemia. [name] is on no medications. Appetite is normal.  Lab Results  Component Value Date   HGBA1C 5.7 (H) 04/03/2020   Lab Results  Component Value Date   INSULIN 12.0 04/03/2020   Vitamin D deficiency. [name] is taking high dose Vitamin D supplementation.    Ref. Range 04/03/2020 14:11  Vitamin D, 25-Hydroxy Latest Ref Range: 30.0 - 100.0 ng/mL 20.0 (L)   At risk for diabetes mellitus. [name] is at higher than average risk for developing diabetes due prediabetes.  Assessment/Plan:   Pre-diabetes. [name] will continue to work on weight loss, increasing exercise, increasing healthy fats and protein, and decreasing simple carbohydrates to help decrease the risk of diabetes.   Vitamin D deficiency. Low Vitamin D level contributes to fatigue and are associated with obesity, breast, and colon cancer. She agrees to continue to take prescription Vitamin D, Ergocalciferol, (DRISDOL) 1.25 MG (50000 UNIT) CAPS  capsule every week #4 with 0 refills and will follow-up for routine testing of Vitamin D, at least 2-3 times per year to avoid over-replacement.   At risk for diabetes mellitus. [name] was given approximately 15 minutes of diabetes education and counseling today. We discussed intensive lifestyle modifications today with an emphasis on weight loss as well as increasing exercise and decreasing simple carbohydrates in her diet. We also reviewed medication options with an emphasis on risk versus benefit of those discussed.   Repetitive spaced learning was employed today to elicit superior memory formation and behavioral change.  Class 1 obesity with serious comorbidity and body mass index (BMI) of 33.0 to 33.9 in adult, unspecified obesity type.  [name] is currently in the action stage of change. As such, her goal is to continue with weight loss efforts. She has agreed to the Category 2 Plan.   She will work on meal planning and intentional eating.   Exercise goals: All adults should avoid inactivity. Some physical activity is better than none, and adults who participate in any amount of physical activity gain some health benefits.  Behavioral modification strategies: increasing lean protein intake, decreasing simple carbohydrates, increasing vegetables, increasing water intake, decreasing eating out, no skipping meals, meal planning and cooking strategies, keeping healthy foods in the home and planning for success.  [name] has agreed to follow-up with our clinic in 2 weeks. She was informed of the importance of frequent follow-up visits to maximize her success with intensive lifestyle modifications for her multiple health conditions.   Objective:   Blood pressure 139/85,  pulse 68, temperature 98.1 F (36.7 C), height 5' (1.524 m), weight 169 lb (76.7 kg), SpO2 96 %. Body mass index is 33.01 kg/m.  General: Cooperative, alert, well developed, in no acute distress. HEENT: Conjunctivae and  lids unremarkable. Cardiovascular: Regular rhythm.  Lungs: Normal work of breathing. Neurologic: No focal deficits.   Lab Results  Component Value Date   CREATININE 0.84 04/03/2020   BUN 20 04/03/2020   NA 140 04/03/2020   K 4.5 04/03/2020   CL 102 04/03/2020   CO2 24 04/03/2020   Lab Results  Component Value Date   ALT 51 (H) 04/03/2020   AST 28 04/03/2020   ALKPHOS 76 04/03/2020   BILITOT 0.3 04/03/2020   Lab Results  Component Value Date   HGBA1C 5.7 (H) 04/03/2020   HGBA1C 5.6 09/09/2018   HGBA1C 5.9 (H) 10/22/2016   HGBA1C CANCELED 10/22/2016   Lab Results  Component Value Date   INSULIN 12.0 04/03/2020   Lab Results  Component Value Date   TSH 0.943 12/28/2019   Lab Results  Component Value Date   CHOL 277 (H) 12/28/2019   HDL 52 12/28/2019   LDLCALC 183 (H) 12/28/2019   TRIG 224 (H) 12/28/2019   CHOLHDL 5.3 (H) 12/28/2019   Lab Results  Component Value Date   WBC 6.4 04/03/2020   HGB 15.4 04/03/2020   HCT 46.2 04/03/2020   MCV 98 (H) 04/03/2020   PLT 300 04/03/2020   No results found for: IRON, TIBC, FERRITIN  Attestation Statements:   Reviewed by clinician on day of visit: allergies, medications, problem list, medical history, surgical history, family history, social history, and previous encounter notes.  Fernanda Drum, am acting as Energy manager for [employer], DO   I have reviewed the above documentation for accuracy and completeness, and I agree with the above. Corinna Capra, DO

## 2020-05-28 ENCOUNTER — Telehealth: Payer: Self-pay | Admitting: Family Medicine

## 2020-05-28 NOTE — Telephone Encounter (Signed)
Referral Followup °

## 2020-06-03 ENCOUNTER — Other Ambulatory Visit: Payer: Self-pay

## 2020-06-03 ENCOUNTER — Encounter (INDEPENDENT_AMBULATORY_CARE_PROVIDER_SITE_OTHER): Payer: Self-pay | Admitting: Bariatrics

## 2020-06-03 ENCOUNTER — Ambulatory Visit (INDEPENDENT_AMBULATORY_CARE_PROVIDER_SITE_OTHER): Payer: BC Managed Care – PPO | Admitting: Bariatrics

## 2020-06-03 VITALS — BP 127/82 | HR 64 | Temp 98.3°F | Ht 60.0 in | Wt 168.0 lb

## 2020-06-03 DIAGNOSIS — Z9189 Other specified personal risk factors, not elsewhere classified: Secondary | ICD-10-CM | POA: Diagnosis not present

## 2020-06-03 DIAGNOSIS — Z6832 Body mass index (BMI) 32.0-32.9, adult: Secondary | ICD-10-CM

## 2020-06-03 DIAGNOSIS — E038 Other specified hypothyroidism: Secondary | ICD-10-CM

## 2020-06-03 DIAGNOSIS — E669 Obesity, unspecified: Secondary | ICD-10-CM

## 2020-06-03 DIAGNOSIS — R5383 Other fatigue: Secondary | ICD-10-CM

## 2020-06-03 DIAGNOSIS — I10 Essential (primary) hypertension: Secondary | ICD-10-CM | POA: Diagnosis not present

## 2020-06-04 ENCOUNTER — Encounter (INDEPENDENT_AMBULATORY_CARE_PROVIDER_SITE_OTHER): Payer: Self-pay | Admitting: Bariatrics

## 2020-06-04 ENCOUNTER — Other Ambulatory Visit (INDEPENDENT_AMBULATORY_CARE_PROVIDER_SITE_OTHER): Payer: Self-pay | Admitting: Bariatrics

## 2020-06-04 LAB — T4, FREE: Free T4: 1.64 ng/dL (ref 0.82–1.77)

## 2020-06-04 LAB — CBC WITH DIFFERENTIAL/PLATELET
Basophils Absolute: 0 10*3/uL (ref 0.0–0.2)
Basos: 1 %
EOS (ABSOLUTE): 0.1 10*3/uL (ref 0.0–0.4)
Eos: 1 %
Hematocrit: 43.4 % (ref 34.0–46.6)
Hemoglobin: 14.6 g/dL (ref 11.1–15.9)
Immature Grans (Abs): 0 10*3/uL (ref 0.0–0.1)
Immature Granulocytes: 0 %
Lymphocytes Absolute: 2.7 10*3/uL (ref 0.7–3.1)
Lymphs: 42 %
MCH: 32.7 pg (ref 26.6–33.0)
MCHC: 33.6 g/dL (ref 31.5–35.7)
MCV: 97 fL (ref 79–97)
Monocytes Absolute: 0.5 10*3/uL (ref 0.1–0.9)
Monocytes: 8 %
Neutrophils Absolute: 3 10*3/uL (ref 1.4–7.0)
Neutrophils: 48 %
Platelets: 314 10*3/uL (ref 150–450)
RBC: 4.46 x10E6/uL (ref 3.77–5.28)
RDW: 12.6 % (ref 11.7–15.4)
WBC: 6.3 10*3/uL (ref 3.4–10.8)

## 2020-06-04 LAB — LIPID PANEL WITH LDL/HDL RATIO
Cholesterol, Total: 258 mg/dL — ABNORMAL HIGH (ref 100–199)
HDL: 50 mg/dL (ref >39)
LDL Chol Calc (NIH): 179 mg/dL — ABNORMAL HIGH (ref 0–99)
LDL/HDL Ratio: 3.6 ratio — ABNORMAL HIGH (ref 0.0–3.2)
Triglycerides: 157 mg/dL — ABNORMAL HIGH (ref 0–149)
VLDL Cholesterol Cal: 29 mg/dL (ref 5–40)

## 2020-06-04 LAB — T3: T3, Total: 86 ng/dL (ref 71–180)

## 2020-06-04 LAB — HEMOGLOBIN A1C
Est. average glucose Bld gHb Est-mCnc: 117 mg/dL
Hgb A1c MFr Bld: 5.7 % — ABNORMAL HIGH (ref 4.8–5.6)

## 2020-06-04 LAB — TSH: TSH: 0.213 u[IU]/mL — ABNORMAL LOW (ref 0.450–4.500)

## 2020-06-04 LAB — VITAMIN B12: Vitamin B-12: 476 pg/mL (ref 232–1245)

## 2020-06-04 MED ORDER — LEVOTHYROXINE SODIUM 100 MCG PO TABS
100.0000 ug | ORAL_TABLET | Freq: Every day | ORAL | 0 refills | Status: DC
Start: 2020-06-04 — End: 2020-07-18

## 2020-06-04 NOTE — Progress Notes (Signed)
Chief Complaint:   OBESITY [name] is here to discuss her progress with her obesity treatment plan along with follow-up of her obesity related diagnoses. [name] is on the Category 2 Plan and states she is following her eating plan approximately 75% of the time. Mylasia states she is swimming 30-45 minutes 5-6 times per week and walking 20-30 minutes 5-6 times per week.  Today's visit was #: 4 Starting weight: 177 lbs Starting date: 04/03/2020 Today's weight: 168 lbs Today's date: 06/03/2020 Total lbs lost to date: 9 Total lbs lost since last in-office visit: 1  Interim History: [name] went down 1 lb and has done well overall.  Subjective:   Essential hypertension. [name] is taking Vasotec. Blood pressure is controlled.  BP Readings from Last 3 Encounters:  06/03/20 127/82  05/14/20 139/85  04/29/20 130/83   Lab Results  Component Value Date   CREATININE 0.84 04/03/2020   CREATININE 0.85 12/28/2019   CREATININE 0.84 06/28/2019   Other specified hypothyroidism. [name] is taking Synthroid.  Lab Results  Component Value Date   TSH 0.943 12/28/2019   Other fatigue. [name] reports a history of anemia (cause unknown). She denies shortness of breath or heart problems. Her pulse is even and without irregularity.  At risk for activity intolerance. [name] is at risk of exercise intolerance due to fatigue.  Assessment/Plan:   Essential hypertension. Demetria is working on healthy weight loss and exercise to improve blood pressure control. We will watch for signs of hypotension as she continues her lifestyle modifications. She will continue her medication as directed. CBC with Differential/Platelet will be checked today.  Other specified hypothyroidism. Patient with long-standing hypothyroidism, on levothyroxine therapy. She appears euthyroid. Orders and follow up as documented in patient record. [name] will continue her medication as directed. T3, T4,  free, TSH levels will be checked today.  Counseling . Good thyroid control is important for overall health. Supratherapeutic thyroid levels are dangerous and will not improve weight loss results. . The correct way to take levothyroxine is fasting, with water, separated by at least 30 minutes from breakfast, and separated by more than 4 hours from calcium, iron, multivitamins, acid reflux medications (PPIs).    Other fatigue. Fatigue may be related to obesity, depression or many other causes. Labs will be ordered, and in the meanwhile, [name] will focus on self care including making healthy food choices, increasing physical activity and focusing on stress reduction. Hemoglobin A1c, Lipid Panel With LDL/HDL Ratio, Vitamin B12, T3, T4, free, TSH labs will be checked today.  At risk for activity intolerance. [name] was given approximately 15 minutes of exercise intolerance counseling today. She is 59 y.o. female and has risk factors exercise intolerance including obesity. We discussed intensive lifestyle modifications today with an emphasis on specific weight loss instructions and strategies. [name] will slowly increase activity as tolerated.  Repetitive spaced learning was employed today to elicit superior memory formation and behavioral change.  Class 1 obesity with serious comorbidity and body mass index (BMI) of 32.0 to 32.9 in adult, unspecified obesity type.  [name] is currently in the action stage of change. As such, her goal is to continue with weight loss efforts. She has agreed to the Category 2 Plan.   She will work on meal planning, intentional eating, and increasing her water intake.   Exercise goals: All adults should avoid inactivity. Some physical activity is better than none, and adults who participate in any amount of physical activity gain some health  benefits.  Behavioral modification strategies: increasing lean protein intake, decreasing simple carbohydrates, increasing  vegetables, increasing water intake, decreasing eating out, no skipping meals, meal planning and cooking strategies, keeping healthy foods in the home and planning for success.  [name] has agreed to follow-up with our clinic in 2-3 weeks. She was informed of the importance of frequent follow-up visits to maximize her success with intensive lifestyle modifications for her multiple health conditions.   [name] was informed we would discuss her lab results at her next visit unless there is a critical issue that needs to be addressed sooner. [name] agreed to keep her next visit at the agreed upon time to discuss these results.  Objective:   Blood pressure 127/82, pulse 64, temperature 98.3 F (36.8 C), height 5' (1.524 m), weight 168 lb (76.2 kg), SpO2 95 %. Body mass index is 32.81 kg/m.  General: Cooperative, alert, well developed, in no acute distress. HEENT: Conjunctivae and lids unremarkable. Cardiovascular: Regular rhythm.  Lungs: Normal work of breathing. Neurologic: No focal deficits.   Lab Results  Component Value Date   CREATININE 0.84 04/03/2020   BUN 20 04/03/2020   NA 140 04/03/2020   K 4.5 04/03/2020   CL 102 04/03/2020   CO2 24 04/03/2020   Lab Results  Component Value Date   ALT 51 (H) 04/03/2020   AST 28 04/03/2020   ALKPHOS 76 04/03/2020   BILITOT 0.3 04/03/2020   Lab Results  Component Value Date   HGBA1C 5.7 (H) 04/03/2020   HGBA1C 5.6 09/09/2018   HGBA1C 5.9 (H) 10/22/2016   HGBA1C CANCELED 10/22/2016   Lab Results  Component Value Date   INSULIN 12.0 04/03/2020   Lab Results  Component Value Date   TSH 0.943 12/28/2019   Lab Results  Component Value Date   CHOL 277 (H) 12/28/2019   HDL 52 12/28/2019   LDLCALC 183 (H) 12/28/2019   TRIG 224 (H) 12/28/2019   CHOLHDL 5.3 (H) 12/28/2019   Lab Results  Component Value Date   WBC 6.4 04/03/2020   HGB 15.4 04/03/2020   HCT 46.2 04/03/2020   MCV 98 (H) 04/03/2020   PLT 300 04/03/2020    No results found for: IRON, TIBC, FERRITIN  Attestation Statements:   Reviewed by clinician on day of visit: allergies, medications, problem list, medical history, surgical history, family history, social history, and previous encounter notes.  Time spent on visit including pre-visit chart review and post-visit charting and care was 20 minutes.   Fernanda Drum, am acting as Energy manager for [employer], DO   I have reviewed the above documentation for accuracy and completeness, and I agree with the above. Corinna Capra, DO

## 2020-06-04 NOTE — Progress Notes (Signed)
Pt notified, ok to send Rx to [facility]  [place]

## 2020-06-10 ENCOUNTER — Other Ambulatory Visit (INDEPENDENT_AMBULATORY_CARE_PROVIDER_SITE_OTHER): Payer: Self-pay | Admitting: Bariatrics

## 2020-06-10 DIAGNOSIS — E559 Vitamin D deficiency, unspecified: Secondary | ICD-10-CM

## 2020-06-23 ENCOUNTER — Encounter (INDEPENDENT_AMBULATORY_CARE_PROVIDER_SITE_OTHER): Payer: Self-pay | Admitting: Bariatrics

## 2020-06-24 ENCOUNTER — Ambulatory Visit (INDEPENDENT_AMBULATORY_CARE_PROVIDER_SITE_OTHER): Payer: BC Managed Care – PPO | Admitting: Bariatrics

## 2020-06-26 ENCOUNTER — Other Ambulatory Visit (INDEPENDENT_AMBULATORY_CARE_PROVIDER_SITE_OTHER): Payer: Self-pay | Admitting: Bariatrics

## 2020-06-27 ENCOUNTER — Other Ambulatory Visit: Payer: Self-pay

## 2020-06-27 ENCOUNTER — Ambulatory Visit: Payer: BC Managed Care – PPO | Admitting: Family Medicine

## 2020-06-27 ENCOUNTER — Encounter: Payer: Self-pay | Admitting: Family Medicine

## 2020-06-27 VITALS — BP 144/88 | HR 78 | Temp 98.4°F | Ht 60.0 in | Wt 173.0 lb

## 2020-06-27 DIAGNOSIS — E785 Hyperlipidemia, unspecified: Secondary | ICD-10-CM

## 2020-06-27 DIAGNOSIS — R000 Tachycardia, unspecified: Secondary | ICD-10-CM

## 2020-06-27 DIAGNOSIS — R7989 Other specified abnormal findings of blood chemistry: Secondary | ICD-10-CM

## 2020-06-27 DIAGNOSIS — E559 Vitamin D deficiency, unspecified: Secondary | ICD-10-CM

## 2020-06-27 DIAGNOSIS — R002 Palpitations: Secondary | ICD-10-CM

## 2020-06-27 DIAGNOSIS — R5383 Other fatigue: Secondary | ICD-10-CM | POA: Diagnosis not present

## 2020-06-27 DIAGNOSIS — I10 Essential (primary) hypertension: Secondary | ICD-10-CM

## 2020-06-27 DIAGNOSIS — E039 Hypothyroidism, unspecified: Secondary | ICD-10-CM

## 2020-06-27 MED ORDER — VITAMIN D (ERGOCALCIFEROL) 1.25 MG (50000 UNIT) PO CAPS: 50000.0000 [IU] | ORAL_CAPSULE | ORAL | 1 refills | Status: DC

## 2020-06-27 NOTE — Progress Notes (Signed)
Subjective:  Patient ID: [name], female    DOB: 12-03-1960  Age: 59 y.o. MRN: [id number]  CC:  Chief Complaint  Patient presents with  . Follow-up    on hypertension, hyperlipidemia, and hypothyroidism. PT reports she has been having alot of fatigue and resently saw Dr. Manson Passey who did a full round of blood work on her to see why. Pt repoprts no issues with her BP since last OV. Pt checks her BP{ once a week at home pt states it avrages around the 144/88 range like todays reading. Pt reports feeling light headed, dizzy, faint, racing heart rate, pressure in the chest, and some SOB. Pt is hoping it related to her tyroid and med changed recently    HPI DEISHA STULL presents for   Hypertension: Enalapril 20 mg daily. Has been experiencing some fatigue, followed by healthy weight and wellness, Dr. Manson Passey.  July 19 visit.  TSH low at 0.213 at that time. Home readings: high 130's - 140's.  BP Readings from Last 3 Encounters:  06/27/20 (!) 144/88  06/03/20 127/82  05/14/20 139/85   Lab Results  Component Value Date   CREATININE 0.84 04/03/2020   Hypothyroidism:  Has been experiencing some fatigue, followed by healthy weight and wellness, Dr. Manson Passey.  July 19 visit.  CBC was normal,  B12 level was normal. TSH low at 0.213 at that time.  Synthroid lowered to 100 mcg daily from .  As above she has had episodes of feeling lightheaded, heart palpitations, dizzy, dyspnea. Felt symptoms past 5 weeks.  Less fatigue, palpitations since changing thyroid med. feels like coming out of prior symptoms. Able to walk and exercise in pool - doing ok now. Does not feel stressed.  No recent chest pains.  No syncope.  Caffeine only in the morning, sleeping ok.  Using headphones now, earbuds did not work.  Ran out of vitamin D - last dose 1.5 weeks ago. On 50k units per week. Since low of 20 on 04/03/20 Orthostatic VS for the past 24 hrs (Last 3 readings):  BP- Lying Pulse- Lying BP-  Sitting Pulse- Sitting BP- Standing at 0 minutes Pulse- Standing at 0 minutes BP- Standing at 3 minutes Pulse- Standing at 3 minutes  06/27/20 0911 -- -- -- -- -- -- (!) 140/92 77  06/27/20 0901 130/80 72 132/82 72 130/80 71 -- --     Last vitamin D Lab Results  Component Value Date   VD25OH 20.0 (L) 04/03/2020    Lab Results  Component Value Date   TSH 0.213 (L) 06/03/2020    Hyperlipidemia: Did not tolerate Lipitor in the past with fatigue/confusion.  The 10-year ASCVD risk score Denman George DC Montez Hageman., et al., 2013) is: 7.2%   Values used to calculate the score:     Age: 25 years     Sex: Female     Is Non-Hispanic African American: No     Diabetic: No     Tobacco smoker: No     Systolic Blood Pressure: 144 mmHg     Is BP treated: Yes     HDL Cholesterol: 50 mg/dL     Total Cholesterol: 258 mg/dL  Lab Results  Component Value Date   CHOL 258 (H) 06/03/2020   HDL 50 06/03/2020   LDLCALC 179 (H) 06/03/2020   TRIG 157 (H) 06/03/2020   CHOLHDL 5.3 (H) 12/28/2019   Lab Results  Component Value Date   ALT 51 (H) 04/03/2020   AST 28 04/03/2020  ALKPHOS 76 04/03/2020   BILITOT 0.3 04/03/2020      History Patient Active Problem List   Diagnosis Date Noted  . Elevated ALT measurement 04/04/2020  . Prediabetes 06/09/2017  . 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 Hyperlipidemia 10/21/2016  . Obesity 02/15/2014  . 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 Hypothyroid 06/10/2012  . HTN (hypertension) 06/10/2012   Past Medical History:  Diagnosis Date  . Allergy    dust mite allergies; Zyrtec.  [name] Blood transfusion without reported diagnosis    post-operatively in 20s after L1 fracture with spinal cord injury after horseback riding.  . Chronic kidney disease    Tuberous Sclerosis; [facility] at [facility].  [name] Hyperlipidemia   . Hypertension   . Hypothyroidism   . Lower back pain   . Thyroid disease    Past Surgical History:  Procedure  Laterality Date  . ABDOMINAL HYSTERECTOMY     DUB; ovaries remaining.  [name] CESAREAN SECTION    . MENISCUS REPAIR    . Sleep Study  12/17/2010   No sleep disordered breathing or periodic limb mvmets.  Excess light sleep.  [name] SPINE SURGERY     L1 fracture with spinal cord injury with horseback riding.  . TUBAL LIGATION     Allergies  Allergen Reactions  . Sulfa Antibiotics Hives  . Lipitor [Atorvastatin] Other (See Comments)    Fatigue and confusion   Prior to Admission medications   Medication Sig Start Date End Date Taking? Authorizing Provider  enalapril (VASOTEC) 20 MG tablet Take 1 tablet (20 mg total) by mouth daily. 06/28/19  Yes [name], MD  levothyroxine (SYNTHROID) 100 MCG tablet Take 1 tablet (100 mcg total) by mouth daily before breakfast. 06/04/20  Yes Corinna Capra A, DO  Vitamin D, Ergocalciferol, (DRISDOL) 1.25 MG (50000 UNIT) CAPS capsule Take 1 capsule (50,000 Units total) by mouth every 7 (seven) days. 05/14/20  Yes [place] Nickel, DO   Social History   Socioeconomic History  . Marital status: Married    Spouse name: Burnis Kingfisher  . Number of children: Not on file  . Years of education: Not on file  . Highest education level: Not on file  Occupational History  . Occupation: Tourist information centre manager  Tobacco Use  . Smoking status: Never Smoker  . Smokeless tobacco: Never Used  Substance and Sexual Activity  . Alcohol use: Yes    Comment: occ  . Drug use: No  . Sexual activity: Yes    Birth control/protection: None  Other Topics Concern  . Not on file  Social History Narrative   Marital status: married x 20+ years; happily married.      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      Tobacco; none      Alcohol:  Socially      Drugs: none      Exercise: sporadic   Social Determinants of Health   Financial Resource Strain:   . Difficulty of Paying Living Expenses:   Food  Insecurity:   . Worried About Programme researcher, broadcasting/film/video in the Last Year:   . Barista in the Last Year:   Transportation Needs:   . Freight forwarder (Medical):   [name] Lack of Transportation (Non-Medical):   Physical Activity:   . Days of Exercise per Week:   .  Minutes of Exercise per Session:   Stress:   . Feeling of Stress :   Social Connections:   . Frequency of Communication with Friends and Family:   . Frequency of Social Gatherings with Friends and Family:   . Attends Religious Services:   . Active Member of Clubs or Organizations:   . Attends Banker Meetings:   [name] Marital Status:   Intimate Partner Violence:   . Fear of Current or Ex-Partner:   . Emotionally Abused:   [name] Physically Abused:   . Sexually Abused:     Review of Systems  Per HPI.  Objective:   Vitals:   06/27/20 0856  BP: (!) 144/88  Pulse: 78  Temp: 98.4 F (36.9 C)  TempSrc: Temporal  SpO2: 95%  Weight: 173 lb (78.5 kg)  Height: 5' (1.524 m)     Physical Exam Vitals reviewed.  Constitutional:      Appearance: She is well-developed.  HENT:     Head: Normocephalic and atraumatic.  Eyes:     Conjunctiva/sclera: Conjunctivae normal.     Pupils: Pupils are equal, round, and reactive to light.  Neck:     Vascular: No carotid bruit.  Cardiovascular:     Rate and Rhythm: Normal rate and regular rhythm.     Heart sounds: Normal heart sounds.  Pulmonary:     Effort: Pulmonary effort is normal.     Breath sounds: Normal breath sounds.  Abdominal:     Palpations: Abdomen is soft. There is no pulsatile mass.     Tenderness: There is no abdominal tenderness.  Skin:    General: Skin is warm and dry.  Neurological:     Mental Status: She is alert and oriented to person, place, and time.  Psychiatric:        Behavior: Behavior normal.    EKG sinus rhythm,No acute findings, no apparent changes from EKG 04/03/2020.  41 minutes spent during visit, greater than 50% counseling and  assimilation of information, chart review, and discussion of plan.   Assessment & Plan:  LAIANA FRATUS is a 59 y.o. female . Fatigue, unspecified type Vitamin D deficiency - Plan: Vitamin D, Ergocalciferol, (DRISDOL) 1.25 MG (50000 UNIT) CAPS capsule, VITAMIN D 25 Hydroxy (Vit-D Deficiency, Fractures) Heart rate fast - Plan: EKG 12-Lead Palpitations Hypothyroidism, unspecified type Low serum vitamin D - Plan: Vitamin D, Ergocalciferol, (DRISDOL) 1.25 MG (50000 UNIT) CAPS capsule, VITAMIN D 25 Hydroxy (Vit-D Deficiency, Fractures)  -Suspect symptoms are due to her previous slight low TSH, possible overcorrection and hypothyroidism.  She has had some improvement in symptoms since she has decreased dose.  Reassuring CBC, electrolytes, B12, repeat EKG today.  Borderline elevated blood pressure at home but she would like to monitor readings first before changing meds.  RTC precautions given if previous symptoms of dyspnea, palpitations, fatigue, lightheadedness or not continue to improve, sooner if worse.  Essential hypertension   - option of additional 10mg  enalapril if persistent elevations at home.  Handout on appropriate home monitoring.  Hyperlipidemia.  -Elevated but ASCVD risk score borderline.  Hold on meds for now.  Anticipate improvement with her efforts at healthy weight and wellness and weight loss.  Recheck levels in 6 months.  Did not tolerate Lipitor previously.   Meds ordered this encounter  Medications  . Vitamin D, Ergocalciferol, (DRISDOL) 1.25 MG (50000 UNIT) CAPS capsule    Sig: Take 1 capsule (50,000 Units total) by mouth every 7 (seven) days.  Dispense:  12 capsule    Refill:  1   Patient Instructions    Keep a record of your blood pressures outside of the office and if over 140/90 - let me know and we can add some additional enalapril.   As long as fatigue continues to improve, heart palpitations, shortness of breath, and other symptoms are continuing to resolve  -  no further work-up at this time.  However if you do have any worsening or persistent symptoms follow-up and we can discuss next step.  New thyroid dose may be the solution.  Make sure to drink plenty of fluids.   Restart vitamin D once per week.   Cholesterol elevated, but not at level to start meds at this time. Recheck levels in next 6 months.   Return to the clinic or go to the nearest emergency room if any of your symptoms worsen or new symptoms occur.   How to Take Your Blood Pressure Blood pressure is a measurement of how strongly your blood is pressing against the walls of your arteries. Arteries are blood vessels that carry blood from your heart throughout your body. Your health care provider takes your blood pressure at each office visit. You can also take your own blood pressure at home with a blood pressure machine. You may need to take your own blood pressure:  To confirm a diagnosis of high blood pressure (hypertension).  To monitor your blood pressure over time.  To make sure your blood pressure medicine is working. Supplies needed: To take your blood pressure, you will need a blood pressure machine. You can buy a blood pressure machine, or blood pressure monitor, at most drugstores or online. There are several types of home blood pressure monitors. When choosing one, consider the following:  Choose a monitor that has an arm cuff.  Choose a cuff that wraps snugly around your upper arm. You should be able to fit only one finger between your arm and the cuff.  Do not choose a monitor that measures your blood pressure from your wrist or finger. Your health care provider can suggest a reliable monitor that will meet your needs. How to prepare To get the most accurate reading, avoid the following for 30 minutes before you check your blood pressure:  Drinking caffeine.  Drinking alcohol.  Eating.  Smoking.  Exercising. Five minutes before you check your blood  pressure:  Empty your bladder.  Sit quietly without talking in a dining chair, rather than in a soft couch or armchair. How to take your blood pressure To check your blood pressure, follow the instructions in the manual that came with your blood pressure monitor. If you have a digital blood pressure monitor, the instructions may be as follows: 1. Sit up straight. 2. Place your feet on the floor. Do not cross your ankles or legs. 3. Rest your left arm at the level of your heart on a table or desk or on the arm of a chair. 4. Pull up your shirt sleeve. 5. Wrap the blood pressure cuff around the upper part of your left arm, 1 inch (2.5 cm) above your elbow. It is best to wrap the cuff around bare skin. 6. Fit the cuff snugly around your arm. You should be able to place only one finger between the cuff and your arm. 7. Position the cord inside the groove of your elbow. 8. Press the power button. 9. Sit quietly while the cuff inflates and deflates. 10. Read the  digital reading on the monitor screen and write it down (record it). 11. Wait 2-3 minutes, then repeat the steps, starting at step 1. What does my blood pressure reading mean? A blood pressure reading consists of a higher number over a lower number. Ideally, your blood pressure should be below 120/80. The first ("top") number is called the systolic pressure. It is a measure of the pressure in your arteries as your heart beats. The second ("bottom") number is called the diastolic pressure. It is a measure of the pressure in your arteries as the heart relaxes. Blood pressure is classified into four stages. The following are the stages for adults who do not have a short-term serious illness or a chronic condition. Systolic pressure and diastolic pressure are measured in a unit called mm Hg. Normal  Systolic pressure: below 120.  Diastolic pressure: below 80. Elevated  Systolic pressure: 120-129.  Diastolic pressure: below  80. Hypertension stage 1  Systolic pressure: 130-139.  Diastolic pressure: 80-89. Hypertension stage 2  Systolic pressure: 140 or above.  Diastolic pressure: 90 or above. You can have prehypertension or hypertension even if only the systolic or only the diastolic number in your reading is higher than normal. Follow these instructions at home:  Check your blood pressure as often as recommended by your health care provider.  Take your monitor to the next appointment with your health care provider to make sure: ? That you are using it correctly. ? That it provides accurate readings.  Be sure you understand what your goal blood pressure numbers are.  Tell your health care provider if you are having any side effects from blood pressure medicine. Contact a health care provider if:  Your blood pressure is consistently high. Get help right away if:  Your systolic blood pressure is higher than 180.  Your diastolic blood pressure is higher than 110. This information is not intended to replace advice given to you by your health care provider. Make sure you discuss any questions you have with your health care provider. Document Revised: 10/15/2017 Document Reviewed: 04/10/2016 Elsevier Patient Education  [employer]2020 [employer]   If you have lab work done today you will be contacted with your lab results within the next 2 weeks.  If you have not heard from us then please contact us. The fastest way to get your results is to register for My Chart.   IF you received an x-ray today, you will receive an invoice from [facility]. Please contact [facility] at [phone] with questions or concerns regarding your invoice.   IF you received labwork today, you will receive an invoice from [facility]. Please contact [facility] at [phone] with questions or concerns regarding your invoice.   Our billing staff will not be able to assist you with questions regarding bills from these  companies.  You will be contacted with the lab results as soon as they are available. The fastest way to get your results is to activate your My Chart account. Instructions are located on the last page of this paperwork. If you have not heard from us regarding the results in 2 weeks, please contact this office.         Signed, Meredith StaggersJeffrey Zariah Cavendish, MD [facility]

## 2020-06-27 NOTE — Patient Instructions (Addendum)
Keep a record of your blood pressures outside of the office and if over 140/90 - let me know and we can add some additional enalapril.   As long as fatigue continues to improve, heart palpitations, shortness of breath, and other symptoms are continuing to resolve -  no further work-up at this time.  However if you do have any worsening or persistent symptoms follow-up and we can discuss next step.  New thyroid dose may be the solution.  Make sure to drink plenty of fluids.   Restart vitamin D once per week.   Cholesterol elevated, but not at level to start meds at this time. Recheck levels in next 6 months.   Return to the clinic or go to the nearest emergency room if any of your symptoms worsen or new symptoms occur.   How to Take Your Blood Pressure Blood pressure is a measurement of how strongly your blood is pressing against the walls of your arteries. Arteries are blood vessels that carry blood from your heart throughout your body. Your health care provider takes your blood pressure at each office visit. You can also take your own blood pressure at home with a blood pressure machine. You may need to take your own blood pressure:  To confirm a diagnosis of high blood pressure (hypertension).  To monitor your blood pressure over time.  To make sure your blood pressure medicine is working. Supplies needed: To take your blood pressure, you will need a blood pressure machine. You can buy a blood pressure machine, or blood pressure monitor, at most drugstores or online. There are several types of home blood pressure monitors. When choosing one, consider the following:  Choose a monitor that has an arm cuff.  Choose a cuff that wraps snugly around your upper arm. You should be able to fit only one finger between your arm and the cuff.  Do not choose a monitor that measures your blood pressure from your wrist or finger. Your health care provider can suggest a reliable monitor that will  meet your needs. How to prepare To get the most accurate reading, avoid the following for 30 minutes before you check your blood pressure:  Drinking caffeine.  Drinking alcohol.  Eating.  Smoking.  Exercising. Five minutes before you check your blood pressure:  Empty your bladder.  Sit quietly without talking in a dining chair, rather than in a soft couch or armchair. How to take your blood pressure To check your blood pressure, follow the instructions in the manual that came with your blood pressure monitor. If you have a digital blood pressure monitor, the instructions may be as follows: 1. Sit up straight. 2. Place your feet on the floor. Do not cross your ankles or legs. 3. Rest your left arm at the level of your heart on a table or desk or on the arm of a chair. 4. Pull up your shirt sleeve. 5. Wrap the blood pressure cuff around the upper part of your left arm, 1 inch (2.5 cm) above your elbow. It is best to wrap the cuff around bare skin. 6. Fit the cuff snugly around your arm. You should be able to place only one finger between the cuff and your arm. 7. Position the cord inside the groove of your elbow. 8. Press the power button. 9. Sit quietly while the cuff inflates and deflates. 10. Read the digital reading on the monitor screen and write it down (record it). 11. Wait 2-3 minutes, then repeat  the steps, starting at step 1. What does my blood pressure reading mean? A blood pressure reading consists of a higher number over a lower number. Ideally, your blood pressure should be below 120/80. The first ("top") number is called the systolic pressure. It is a measure of the pressure in your arteries as your heart beats. The second ("bottom") number is called the diastolic pressure. It is a measure of the pressure in your arteries as the heart relaxes. Blood pressure is classified into four stages. The following are the stages for adults who do not have a short-term serious  illness or a chronic condition. Systolic pressure and diastolic pressure are measured in a unit called mm Hg. Normal  Systolic pressure: below 120.  Diastolic pressure: below 80. Elevated  Systolic pressure: 120-129.  Diastolic pressure: below 80. Hypertension stage 1  Systolic pressure: 130-139.  Diastolic pressure: 80-89. Hypertension stage 2  Systolic pressure: 140 or above.  Diastolic pressure: 90 or above. You can have prehypertension or hypertension even if only the systolic or only the diastolic number in your reading is higher than normal. Follow these instructions at home:  Check your blood pressure as often as recommended by your health care provider.  Take your monitor to the next appointment with your health care provider to make sure: ? That you are using it correctly. ? That it provides accurate readings.  Be sure you understand what your goal blood pressure numbers are.  Tell your health care provider if you are having any side effects from blood pressure medicine. Contact a health care provider if:  Your blood pressure is consistently high. Get help right away if:  Your systolic blood pressure is higher than 180.  Your diastolic blood pressure is higher than 110. This information is not intended to replace advice given to you by your health care provider. Make sure you discuss any questions you have with your health care provider. Document Revised: 10/15/2017 Document Reviewed: 04/10/2016 Elsevier Patient Education  [employer].   If you have lab work done today you will be contacted with your lab results within the next 2 weeks.  If you have not heard from [place] then please contact us. The fastest way to get your results is to register for My Chart.   IF you received an x-ray today, you will receive an invoice from [facility]. Please contact [facility] at [phone] with questions or concerns regarding your invoice.   IF  you received labwork today, you will receive an invoice from [place]. Please contact [facility] at [phone] with questions or concerns regarding your invoice.   Our billing staff will not be able to assist you with questions regarding bills from these companies.  You will be contacted with the lab results as soon as they are available. The fastest way to get your results is to activate your My Chart account. Instructions are located on the last page of this paperwork. If you have not heard from [place] regarding the results in 2 weeks, please contact this office.

## 2020-06-28 LAB — VITAMIN D 25 HYDROXY (VIT D DEFICIENCY, FRACTURES): Vit D, 25-Hydroxy: 29.4 ng/mL — ABNORMAL LOW (ref 30.0–100.0)

## 2020-07-04 ENCOUNTER — Other Ambulatory Visit: Payer: Self-pay | Admitting: Family Medicine

## 2020-07-04 DIAGNOSIS — E039 Hypothyroidism, unspecified: Secondary | ICD-10-CM

## 2020-07-04 NOTE — Telephone Encounter (Signed)
Requested Prescriptions  Pending Prescriptions Disp Refills  . levothyroxine (SYNTHROID) 112 MCG tablet [Pharmacy Med Name: LEVOTHYROXINE 112 MCG TABLET] 90 tablet 3    Sig: TAKE 1 TABLET (112 MCG TOTAL) BY MOUTH DAILY BEFORE BREAKFAST.     Endocrinology:  Hypothyroid Agents Failed - 07/04/2020  1:47 AM      Failed - TSH needs to be rechecked within 3 months after an abnormal result. Refill until TSH is due.      Failed - TSH in normal range and within 360 days    TSH  Date Value Ref Range Status  06/03/2020 0.213 (L) 0.450 - 4.500 uIU/mL Final         Passed - Valid encounter within last 12 months    Recent Outpatient Visits          1 week ago Fatigue, unspecified type   [facility], Asencion Partridge, MD   2 months ago Left ear pain   [facility], Asencion Partridge, MD   2 months ago Left ear pain   [facility], Asencion Partridge, MD   4 months ago Hip pain, chronic, right   [facility], Gerlene Burdock, NP   6 months ago Annual physical exam   [facility], Asencion Partridge, MD      Future Appointments            In 6 months [name], MD [facility] [place], [facility]           . enalapril (VASOTEC) 20 MG tablet [Pharmacy Med Name: ENALAPRIL MALEATE 20 MG TAB] 90 tablet 0    Sig: TAKE 1 TABLET BY MOUTH EVERY DAY     Cardiovascular:  ACE Inhibitors Failed - 07/04/2020  1:47 AM      Failed - Last BP in normal range    BP Readings from Last 1 Encounters:  06/27/20 (!) 144/88         Passed - Cr in normal range and within 180 days    Creat  Date Value Ref Range Status  06/29/2016 0.86 0.50 - 1.05 mg/dL Final    Comment:      For patients > or = 60 years of age: The upper reference limit for Creatinine is approximately 13% higher for people identified as African-American.      Creatinine, Ser  Date Value Ref Range Status  04/03/2020 0.84 0.57 - 1.00 mg/dL Final         Passed - K in normal range and  within 180 days    Potassium  Date Value Ref Range Status  04/03/2020 4.5 3.5 - 5.2 mmol/L Final         Passed - Patient is not pregnant      Passed - Valid encounter within last 6 months    Recent Outpatient Visits          1 week ago Fatigue, unspecified type   [facility], Asencion Partridge, MD   2 months ago Left ear pain   [facility], Asencion Partridge, MD   2 months ago Left ear pain   [facility], Asencion Partridge, MD   4 months ago Hip pain, chronic, right   [facility], Gerlene Burdock, NP   6 months ago Annual physical exam   [facility], Asencion Partridge, MD      Future  Appointments            In 6 months Neva Seat, Asencion Partridge, MD [facility] [place], [facility]

## 2020-07-18 ENCOUNTER — Other Ambulatory Visit: Payer: Self-pay

## 2020-07-18 ENCOUNTER — Encounter (INDEPENDENT_AMBULATORY_CARE_PROVIDER_SITE_OTHER): Payer: Self-pay | Admitting: Bariatrics

## 2020-07-18 ENCOUNTER — Ambulatory Visit (INDEPENDENT_AMBULATORY_CARE_PROVIDER_SITE_OTHER): Payer: BC Managed Care – PPO | Admitting: Bariatrics

## 2020-07-18 VITALS — BP 140/80 | HR 74 | Ht 60.0 in | Wt 169.0 lb

## 2020-07-18 DIAGNOSIS — Z6833 Body mass index (BMI) 33.0-33.9, adult: Secondary | ICD-10-CM | POA: Diagnosis not present

## 2020-07-18 DIAGNOSIS — Z9189 Other specified personal risk factors, not elsewhere classified: Secondary | ICD-10-CM | POA: Diagnosis not present

## 2020-07-18 DIAGNOSIS — E039 Hypothyroidism, unspecified: Secondary | ICD-10-CM | POA: Diagnosis not present

## 2020-07-18 DIAGNOSIS — I10 Essential (primary) hypertension: Secondary | ICD-10-CM

## 2020-07-18 DIAGNOSIS — E669 Obesity, unspecified: Secondary | ICD-10-CM | POA: Diagnosis not present

## 2020-07-18 MED ORDER — LEVOTHYROXINE SODIUM 100 MCG PO TABS: 100.0000 ug | ORAL_TABLET | Freq: Every day | ORAL | 0 refills | Status: DC

## 2020-07-18 NOTE — Progress Notes (Signed)
Chief Complaint:   OBESITY [name] is here to discuss her progress with her obesity treatment plan along with follow-up of her obesity related diagnoses. [name] is on the Category 2 Plan and states she is following her eating plan approximately 10% of the time. [name] states she is walking/swimming 20-30 minutes 5 times per week.  Today's visit was #: 5 Starting weight: 177 lbs Starting date: 04/03/2020 Today's weight: 169 lbs Today's date: 07/18/2020 Total lbs lost to date: 8 Total lbs lost since last in-office visit: 0  Interim History: [name] is up 1 lb since her last visit. She has 3 weeks of house guests and has been off the program.  Subjective:   Hypothyroidism, unspecified type. TSH was down slightly when checked on 06/03/2020.  Lab Results  Component Value Date   TSH 0.213 (L) 06/03/2020   Essential hypertension. [name] is taking Vasotec. Blood pressure is controlled.  BP Readings from Last 3 Encounters:  07/18/20 140/80  06/27/20 (!) 144/88  06/03/20 127/82   Lab Results  Component Value Date   CREATININE 0.84 04/03/2020   CREATININE 0.85 12/28/2019   CREATININE 0.84 06/28/2019   At risk for heart disease. [name] is at a higher than average risk for cardiovascular disease due to obesity and hypertension.   Assessment/Plan:   Hypothyroidism, unspecified type. Patient with long-standing hypothyroidism, on levothyroxine therapy. She appears euthyroid. Orders and follow up as documented in patient record. Prescription was given for levothyroxine (SYNTHROID) 100 MCG tablet 1 daily before breakfast. Will recheck TSH in the near future.  Counseling . Good thyroid control is important for overall health. Supratherapeutic thyroid levels are dangerous and will not improve weight loss results. . The correct way to take levothyroxine is fasting, with water, separated by at least 30 minutes from breakfast, and separated by more than 4 hours from  calcium, iron, multivitamins, acid reflux medications (PPIs).    Essential hypertension. [name] is working on healthy weight loss and exercise to improve blood pressure control. We will watch for signs of hypotension as she continues her lifestyle modifications. She will continue her medication as directed, avoid adding salt to her diet, and continue to work on diet and exercise.  At risk for heart disease. [name] was given approximately 15 minutes of coronary artery disease prevention counseling today. She is 59 y.o. female and has risk factors for heart disease including obesity. We discussed intensive lifestyle modifications today with an emphasis on specific weight loss instructions and strategies.   Repetitive spaced learning was employed today to elicit superior memory formation and behavioral change.  Class 1 obesity with serious comorbidity and body mass index (BMI) of 33.0 to 33.9 in adult, unspecified obesity type.  [name] is currently in the action stage of change. As such, her goal is to continue with weight loss efforts. She has agreed to the Category 2 Plan.   She will work on meal planning, intentional eating, and being more adherent to the plan.  Exercise goals: All adults should avoid inactivity. Some physical activity is better than none, and adults who participate in any amount of physical activity gain some health benefits.  Behavioral modification strategies: increasing lean protein intake, decreasing simple carbohydrates, increasing vegetables, increasing water intake, decreasing eating out, no skipping meals, meal planning and cooking strategies, keeping healthy foods in the home and planning for success.  [name] has agreed to follow-up with our clinic in 2-3 weeks. She was informed of the importance of frequent follow-up  visits to maximize her success with intensive lifestyle modifications for her multiple health conditions.   Objective:   Blood pressure 140/80,  pulse 74, height 5' (1.524 m), weight 169 lb (76.7 kg), SpO2 98 %. Body mass index is 33.01 kg/m.  General: Cooperative, alert, well developed, in no acute distress. HEENT: Conjunctivae and lids unremarkable. Cardiovascular: Regular rhythm.  Lungs: Normal work of breathing. Neurologic: No focal deficits.   Lab Results  Component Value Date   CREATININE 0.84 04/03/2020   BUN 20 04/03/2020   NA 140 04/03/2020   K 4.5 04/03/2020   CL 102 04/03/2020   CO2 24 04/03/2020   Lab Results  Component Value Date   ALT 51 (H) 04/03/2020   AST 28 04/03/2020   ALKPHOS 76 04/03/2020   BILITOT 0.3 04/03/2020   Lab Results  Component Value Date   HGBA1C 5.7 (H) 06/03/2020   HGBA1C 5.7 (H) 04/03/2020   HGBA1C 5.6 09/09/2018   HGBA1C 5.9 (H) 10/22/2016   HGBA1C CANCELED 10/22/2016   Lab Results  Component Value Date   INSULIN 12.0 04/03/2020   Lab Results  Component Value Date   TSH 0.213 (L) 06/03/2020   Lab Results  Component Value Date   CHOL 258 (H) 06/03/2020   HDL 50 06/03/2020   LDLCALC 179 (H) 06/03/2020   TRIG 157 (H) 06/03/2020   CHOLHDL 5.3 (H) 12/28/2019   Lab Results  Component Value Date   WBC 6.3 06/03/2020   HGB 14.6 06/03/2020   HCT 43.4 06/03/2020   MCV 97 06/03/2020   PLT 314 06/03/2020   No results found for: IRON, TIBC, FERRITIN  Attestation Statements:   Reviewed by clinician on day of visit: allergies, medications, problem list, medical history, surgical history, family history, social history, and previous encounter notes.  Fernanda Drum, am acting as Energy manager for [employer], DO   I have reviewed the above documentation for accuracy and completeness, and I agree with the above. Corinna Capra, DO

## 2020-07-23 ENCOUNTER — Encounter (INDEPENDENT_AMBULATORY_CARE_PROVIDER_SITE_OTHER): Payer: Self-pay | Admitting: Bariatrics

## 2020-08-01 ENCOUNTER — Ambulatory Visit (INDEPENDENT_AMBULATORY_CARE_PROVIDER_SITE_OTHER): Payer: BC Managed Care – PPO | Admitting: Bariatrics

## 2020-08-12 ENCOUNTER — Other Ambulatory Visit (INDEPENDENT_AMBULATORY_CARE_PROVIDER_SITE_OTHER): Payer: Self-pay | Admitting: Bariatrics

## 2020-08-12 DIAGNOSIS — E039 Hypothyroidism, unspecified: Secondary | ICD-10-CM

## 2020-08-27 ENCOUNTER — Encounter: Payer: Self-pay | Admitting: Registered Nurse

## 2020-08-27 ENCOUNTER — Ambulatory Visit: Payer: BC Managed Care – PPO | Admitting: Registered Nurse

## 2020-08-27 ENCOUNTER — Other Ambulatory Visit: Payer: Self-pay

## 2020-08-27 VITALS — BP 150/87 | HR 78 | Temp 98.0°F | Resp 18 | Ht 60.0 in | Wt 175.0 lb

## 2020-08-27 DIAGNOSIS — E039 Hypothyroidism, unspecified: Secondary | ICD-10-CM | POA: Diagnosis not present

## 2020-08-27 DIAGNOSIS — Z23 Encounter for immunization: Secondary | ICD-10-CM | POA: Diagnosis not present

## 2020-08-27 MED ORDER — LEVOTHYROXINE SODIUM 100 MCG PO TABS: 100.0000 ug | ORAL_TABLET | Freq: Every day | ORAL | 0 refills | Status: DC

## 2020-08-27 NOTE — Patient Instructions (Signed)
° ° ° °  If you have lab work done today you will be contacted with your lab results within the next 2 weeks.  If you have not heard from us then please contact us. The fastest way to get your results is to register for My Chart. ° ° °IF you received an x-ray today, you will receive an invoice from [facility]. Please contact  Radiology at [phone] with questions or concerns regarding your invoice.  ° °IF you received labwork today, you will receive an invoice from [facility]. Please contact [facility] at [phone] with questions or concerns regarding your invoice.  ° °Our billing staff will not be able to assist you with questions regarding bills from these companies. ° °You will be contacted with the lab results as soon as they are available. The fastest way to get your results is to activate your My Chart account. Instructions are located on the last page of this paperwork. If you have not heard from us regarding the results in 2 weeks, please contact this office. °  ° ° ° °

## 2020-08-28 LAB — THYROID PANEL WITH TSH
Free Thyroxine Index: 1.8 (ref 1.2–4.9)
T3 Uptake Ratio: 25 % (ref 24–39)
T4, Total: 7.3 ug/dL (ref 4.5–12.0)
TSH: 2.53 u[IU]/mL (ref 0.450–4.500)

## 2020-10-04 ENCOUNTER — Other Ambulatory Visit: Payer: Self-pay | Admitting: Family Medicine

## 2020-10-27 ENCOUNTER — Encounter: Payer: Self-pay | Admitting: Registered Nurse

## 2020-10-27 NOTE — Progress Notes (Signed)
Established Patient Office Visit  Subjective:  Patient ID: [name], female    DOB: [birth year]  Age: 59 y.o. MRN: [id number]  CC:  Chief Complaint  Patient presents with  . Medication Refill    patient states her visit today is to get medication refill on Levothyroxine. and also a flu shot.     HPI [name] presents for thyroid follow up and med refill  No new symptoms Has been steady for some time Wants to get TSH drawn No other concerns Flu shot due, would like to get this today  Past Medical History:  Diagnosis Date  . Allergy    dust mite allergies; Zyrtec.  [name] Blood transfusion without reported diagnosis    post-operatively in 20s after L1 fracture with spinal cord injury after horseback riding.  . Chronic kidney disease    Tuberous Sclerosis; [facility].  [name] Hyperlipidemia   . Hypertension   . Hypothyroidism   . Lower back pain   . Thyroid disease     Past Surgical History:  Procedure Laterality Date  . ABDOMINAL HYSTERECTOMY     DUB; ovaries remaining.  [name] CESAREAN SECTION    . MENISCUS REPAIR    . Sleep Study  12/17/2010   No sleep disordered breathing or periodic limb mvmets.  Excess light sleep.  [name] SPINE SURGERY     L1 fracture with spinal cord injury with horseback riding.  . TUBAL LIGATION      Family History  Problem Relation Age of Onset  . Thyroid disease Mother   . Arthritis Mother   . Asthma Mother   . Migraines Mother   . High blood pressure Mother   . Sleep apnea Mother   . Heart disease Father 23       AMI age 1; CABG at 49.  . Arthritis Father   . High Cholesterol Father   . AAA (abdominal aortic aneurysm) Father     Social History   Socioeconomic History  . Marital status: Married    Spouse name: Burnis Kingfisher  . Number of children: Not on file  . Years of education: Not on file  . Highest education level: Not on file  Occupational History  . Occupation: Tourist information centre manager  Tobacco Use  . Smoking status: Never  Smoker  . Smokeless tobacco: Never Used  Substance and Sexual Activity  . Alcohol use: Yes    Comment: occ  . Drug use: No  . Sexual activity: Yes    Birth control/protection: None  Other Topics Concern  . Not on file  Social History Narrative   Marital status: married x 20+ years; happily married.      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      Tobacco; none      Alcohol:  Socially      Drugs: none      Exercise: sporadic   Social Determinants of Health   Financial Resource Strain: Not on file  Food Insecurity: Not on file  Transportation Needs: Not on file  Physical Activity: Not on file  Stress: Not on file  Social Connections: Not on file  Intimate Partner Violence: Not on file    Outpatient Medications Prior to Visit  Medication Sig Dispense Refill  . Vitamin D, Ergocalciferol, (DRISDOL) 1.25 MG (50000 UNIT) CAPS capsule Take 1 capsule (50,000 Units total)  by mouth every 7 (seven) days. 12 capsule 1  . enalapril (VASOTEC) 20 MG tablet TAKE 1 TABLET BY MOUTH EVERY DAY 90 tablet 0  . levothyroxine (SYNTHROID) 100 MCG tablet Take 1 tablet (100 mcg total) by mouth daily before breakfast. 30 tablet 0   No facility-administered medications prior to visit.    Allergies  Allergen Reactions  . Sulfa Antibiotics Hives  . Lipitor [Atorvastatin] Other (See Comments)    Fatigue and confusion    ROS Review of Systems  Constitutional: Negative.   HENT: Negative.   Eyes: Negative.   Respiratory: Negative.   Cardiovascular: Negative.   Gastrointestinal: Negative.   Genitourinary: Negative.   Musculoskeletal: Negative.   Skin: Negative.   Neurological: Negative.   Psychiatric/Behavioral: Negative.   All other systems reviewed and are negative.     Objective:    Physical Exam Vitals and nursing note reviewed.  Constitutional:      General: She is not in acute distress.     Appearance: Normal appearance. She is normal weight. She is not ill-appearing, toxic-appearing or diaphoretic.  Cardiovascular:     Rate and Rhythm: Normal rate and regular rhythm.     Heart sounds: Normal heart sounds. No murmur heard. No friction rub. No gallop.   Pulmonary:     Effort: Pulmonary effort is normal. No respiratory distress.     Breath sounds: Normal breath sounds. No stridor. No wheezing, rhonchi or rales.  Chest:     Chest wall: No tenderness.  Skin:    General: Skin is warm and dry.  Neurological:     General: No focal deficit present.     Mental Status: She is alert and oriented to person, place, and time. Mental status is at baseline.  Psychiatric:        Mood and Affect: Mood normal.        Behavior: Behavior normal.        Thought Content: Thought content normal.        Judgment: Judgment normal.     BP (!) 150/87   Pulse 78   Temp 98 F (36.7 C) (Temporal)   Resp 18   Ht 5' (1.524 m)   Wt 175 lb (79.4 kg)   SpO2 97%   BMI 34.18 kg/m  Wt Readings from Last 3 Encounters:  08/27/20 175 lb (79.4 kg)  07/18/20 169 lb (76.7 kg)  06/27/20 173 lb (78.5 kg)     Health Maintenance Due  Topic Date Due  . COVID-19 Vaccine (3 - Booster for Pfizer series) 08/14/2020    There are no preventive care reminders to display for this patient.  Lab Results  Component Value Date   TSH 2.530 08/27/2020   Lab Results  Component Value Date   WBC 6.3 06/03/2020   HGB 14.6 06/03/2020   HCT 43.4 06/03/2020   MCV 97 06/03/2020   PLT 314 06/03/2020   Lab Results  Component Value Date   NA 140 04/03/2020   K 4.5 04/03/2020   CO2 24 04/03/2020   GLUCOSE 86 04/03/2020   BUN 20 04/03/2020   CREATININE 0.84 04/03/2020   BILITOT 0.3 04/03/2020   ALKPHOS 76 04/03/2020   AST 28 04/03/2020   ALT 51 (H) 04/03/2020   PROT 7.3 04/03/2020   ALBUMIN 4.9 04/03/2020   CALCIUM 9.8 04/03/2020   Lab Results  Component Value Date   CHOL 258 (H) 06/03/2020   Lab  Results  Component Value Date   HDL 50 06/03/2020  Lab Results  Component Value Date   LDLCALC 179 (H) 06/03/2020   Lab Results  Component Value Date   TRIG 157 (H) 06/03/2020   Lab Results  Component Value Date   CHOLHDL 5.3 (H) 12/28/2019   Lab Results  Component Value Date   HGBA1C 5.7 (H) 06/03/2020      Assessment & Plan:   Problem List Items Addressed This Visit      Endocrine   Hypothyroid   Relevant Medications   levothyroxine (SYNTHROID) 100 MCG tablet   Other Relevant Orders   Thyroid Panel With TSH (Completed)    Other Visit Diagnoses    Flu vaccine need    -  Primary   Relevant Orders   Flu Vaccine QUAD 6+ mos PF IM (Fluarix Quad PF) (Completed)      Meds ordered this encounter  Medications  . levothyroxine (SYNTHROID) 100 MCG tablet    Sig: Take 1 tablet (100 mcg total) by mouth daily before breakfast.    Dispense:  90 tablet    Refill:  0    Follow-up: No follow-ups on file.   PLAN  Labs collected. Will follow up with the patient as warranted.  Flu shot given  Follow up in 3-6 mos pending lab results  Patient encouraged to call clinic with any questions, comments, or concerns.  Janeece Agee, NP

## 2020-11-18 ENCOUNTER — Other Ambulatory Visit: Payer: Self-pay | Admitting: Registered Nurse

## 2020-11-18 DIAGNOSIS — E039 Hypothyroidism, unspecified: Secondary | ICD-10-CM

## 2020-12-19 ENCOUNTER — Other Ambulatory Visit: Payer: Self-pay | Admitting: Family Medicine

## 2020-12-19 DIAGNOSIS — R7989 Other specified abnormal findings of blood chemistry: Secondary | ICD-10-CM

## 2020-12-19 DIAGNOSIS — E559 Vitamin D deficiency, unspecified: Secondary | ICD-10-CM

## 2020-12-19 NOTE — Telephone Encounter (Signed)
Requested medications are due for refill today yes  Requested medications are on the active medication list yes  Last refill 11/2  Last visit 06/2020  Future visit scheduled 01/03/21  Notes to clinic Not Delegated.

## 2020-12-26 ENCOUNTER — Other Ambulatory Visit: Payer: Self-pay | Admitting: Family Medicine

## 2021-01-03 ENCOUNTER — Ambulatory Visit: Payer: BC Managed Care – PPO | Admitting: Family Medicine

## 2021-01-03 ENCOUNTER — Other Ambulatory Visit: Payer: Self-pay

## 2021-01-03 ENCOUNTER — Encounter: Payer: Self-pay | Admitting: Family Medicine

## 2021-01-03 VITALS — BP 150/81 | HR 74 | Temp 97.4°F | Ht 60.0 in | Wt 182.0 lb

## 2021-01-03 DIAGNOSIS — E785 Hyperlipidemia, unspecified: Secondary | ICD-10-CM

## 2021-01-03 DIAGNOSIS — H539 Unspecified visual disturbance: Secondary | ICD-10-CM

## 2021-01-03 DIAGNOSIS — E039 Hypothyroidism, unspecified: Secondary | ICD-10-CM | POA: Diagnosis not present

## 2021-01-03 DIAGNOSIS — R7303 Prediabetes: Secondary | ICD-10-CM | POA: Diagnosis not present

## 2021-01-03 DIAGNOSIS — I10 Essential (primary) hypertension: Secondary | ICD-10-CM

## 2021-01-03 DIAGNOSIS — E559 Vitamin D deficiency, unspecified: Secondary | ICD-10-CM | POA: Diagnosis not present

## 2021-01-03 MED ORDER — LEVOTHYROXINE SODIUM 100 MCG PO TABS: 100.0000 ug | ORAL_TABLET | Freq: Every day | ORAL | 1 refills | Status: DC

## 2021-01-03 MED ORDER — ENALAPRIL MALEATE 10 MG PO TABS: 10.0000 mg | ORAL_TABLET | Freq: Every day | ORAL | 1 refills | Status: DC

## 2021-01-03 MED ORDER — ENALAPRIL MALEATE 20 MG PO TABS: 20.0000 mg | ORAL_TABLET | Freq: Every day | ORAL | 1 refills | Status: DC

## 2021-01-03 NOTE — Patient Instructions (Addendum)
Let me know if other resources needed for weight management. I do recommend follow up with [facility], and letting them know your needs.   Over the counter vitamin D 2000 units per day may be effective. Depending on reading today, can discuss need for high dose once per week.   I recommend [facility]. Jimmey Ralph at [facility], [facility] eye care are recommendations. Let me know if a referral is needed.   Increase enalapril to 30mg  per day.   Return to the clinic or go to the nearest emergency room if any of your symptoms worsen or new symptoms occur.   If you have lab work done today you will be contacted with your lab results within the next 2 weeks.  If you have not heard from then please contact us. The fastest way to get your results is to register for My Chart.   IF you received an x-ray today, you will receive an invoice from [facility]. Please contact [facility] at [phone] with questions or concerns regarding your invoice.   IF you received labwork today, you will receive an invoice from [facility]. Please contact [facility] at [phone] with questions or concerns regarding your invoice.   Our billing staff will not be able to assist you with questions regarding bills from these companies.  You will be contacted with the lab results as soon as they are available. The fastest way to get your results is to activate your My Chart account. Instructions are located on the last page of this paperwork. If you have not heard from [phone] regarding the results in 2 weeks, please contact this office.

## 2021-01-03 NOTE — Progress Notes (Signed)
Subjective:  Patient ID: [name], female    DOB: October 16, 1961  Age: 60 y.o. MRN: [id number]  CC:  Chief Complaint  Patient presents with  . Medication Refill    Pt needs refill on Vasotec and Drisdol. PT reports these medications work well with no side effects. Pt reports she is currently fasting    HPI [name] presents for   Hypertension: Enalapril 20 mg daily. Borderline September 2, elevated at flu shot October 12. Weight has been increasing. No new side effects.  Home readings: increasing - 140-160/90 or lower.  BP Readings from Last 3 Encounters:  01/03/21 (!) 150/81  08/27/20 (!) 150/87  07/18/20 140/80   Lab Results  Component Value Date   CREATININE 0.76 01/03/2021   Hypothyroidism: Lab Results  Component Value Date   TSH 3.440 01/03/2021  Taking medication daily.  Synthroid 100 mcg daily No new hot or cold intolerance. No new hair or skin changes, heart palpitations or new fatigue. No new weight changes.   Vitamin D deficiency Takes 50,000 units once per week. Vitamin D low at 20 in May 2021, improved at 29.4 in August. Has not taken in a month. Difficulty taking med at times - forgets to take at times. Taking   Obesity with history of prediabetes Has been followed by [facility], last visit in September. Was not successful with weight loss.  Weight has been increasing since September. Less exercise after previous back injury. Some difficulty with diet adherence. Working remotely most days - less activity.  Not ready to return to [facility] at this time.   Body mass index is 35.54 kg/m. Wt Readings from Last 3 Encounters:  01/03/21 182 lb (82.6 kg)  08/27/20 175 lb (79.4 kg)  07/18/20 169 lb (76.7 kg)   Lab Results  Component Value Date   HGBA1C 5.7 (H) 01/03/2021   Eye concern: Feels like losing peripheral vision slowly- past year. Has not seen optometrist recently - 6 months ago. Discussed  concerns, did not feel like evaluated. Family history of macular degeneration.  Plans on optho eval. Dry eye. Brother with glaucoma.    History Patient Active Problem List   Diagnosis Date Noted  . Elevated ALT measurement 04/04/2020  . Prediabetes 06/09/2017  . 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 Hyperlipidemia 10/21/2016  . Obesity 02/15/2014  . 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 Hypothyroid 06/10/2012  . HTN (hypertension) 06/10/2012   Past Medical History:  Diagnosis Date  . Allergy    dust mite allergies; Zyrtec.  [name] Blood transfusion without reported diagnosis    post-operatively in 20s after L1 fracture with spinal cord injury after horseback riding.  . Chronic kidney disease    Tuberous Sclerosis; [facility] at [facility].  [name] Hyperlipidemia   . Hypertension   . Hypothyroidism   . Lower back pain   . Thyroid disease    Past Surgical History:  Procedure Laterality Date  . ABDOMINAL HYSTERECTOMY     DUB; ovaries remaining.  [name] CESAREAN SECTION    . MENISCUS REPAIR    . Sleep Study  12/17/2010   No sleep disordered breathing or periodic limb mvmets.  Excess light sleep.  [name] SPINE SURGERY     L1 fracture with spinal cord injury with horseback riding.  . TUBAL LIGATION     Allergies  Allergen Reactions  . Sulfa Antibiotics Hives  . Lipitor [Atorvastatin] Other (See Comments)  Fatigue and confusion   Prior to Admission medications   Medication Sig Start Date End Date Taking? Authorizing Provider  enalapril (VASOTEC) 20 MG tablet TAKE 1 TABLET BY MOUTH EVERY DAY 12/26/20  Yes [name], MD  levothyroxine (SYNTHROID) 100 MCG tablet TAKE 1 TABLET BY MOUTH DAILY BEFORE BREAKFAST. 11/18/20  Yes [name], MD  Vitamin D, Ergocalciferol, (DRISDOL) 1.25 MG (50000 UNIT) CAPS capsule TAKE 1 CAPSULE (50,000 UNITS TOTAL) BY MOUTH EVERY 7 DAYS. 12/19/20  Yes [name], MD   Social History   Socioeconomic History  .  Marital status: Married    Spouse name: Burnis Kingfisher  . Number of children: Not on file  . Years of education: Not on file  . Highest education level: Not on file  Occupational History  . Occupation: Tourist information centre manager  Tobacco Use  . Smoking status: Never Smoker  . Smokeless tobacco: Never Used  Substance and Sexual Activity  . Alcohol use: Yes    Comment: occ  . Drug use: No  . Sexual activity: Yes    Birth control/protection: None  Other Topics Concern  . Not on file  Social History Narrative   Marital status: married x 20+ years; happily married.      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      Tobacco; none      Alcohol:  Socially      Drugs: none      Exercise: sporadic   Social Determinants of Health   Financial Resource Strain: Not on file  Food Insecurity: Not on file  Transportation Needs: Not on file  Physical Activity: Not on file  Stress: Not on file  Social Connections: Not on file  Intimate Partner Violence: Not on file    Review of Systems  Constitutional: Negative for fatigue and unexpected weight change.  Eyes: Positive for visual disturbance.  Respiratory: Negative for chest tightness and shortness of breath.   Cardiovascular: Negative for chest pain, palpitations and leg swelling.  Gastrointestinal: Negative for abdominal pain and blood in stool.  Neurological: Negative for dizziness, syncope, light-headedness and headaches.     Objective:   Vitals:   01/03/21 0811  BP: (!) 150/81  Pulse: 74  Temp: (!) 97.4 F (36.3 C)  TempSrc: Temporal  SpO2: 97%  Weight: 182 lb (82.6 kg)  Height: 5' (1.524 m)     Physical Exam Vitals reviewed.  Constitutional:      Appearance: She is well-developed and well-nourished.  HENT:     Head: Normocephalic and atraumatic.  Eyes:     Extraocular Movements: EOM normal.     Conjunctiva/sclera: Conjunctivae normal.      Pupils: Pupils are equal, round, and reactive to light.  Neck:     Vascular: No carotid bruit.  Cardiovascular:     Rate and Rhythm: Normal rate and regular rhythm.     Pulses: Intact distal pulses.     Heart sounds: Normal heart sounds.  Pulmonary:     Effort: Pulmonary effort is normal.     Breath sounds: Normal breath sounds.  Abdominal:     Palpations: Abdomen is soft. There is no pulsatile mass.     Tenderness: There is no abdominal tenderness.  Skin:    General: Skin is warm and dry.  Neurological:     Mental Status: She is alert and oriented to  person, place, and time.  Psychiatric:        Mood and Affect: Mood and affect normal.        Behavior: Behavior normal.        Assessment & Plan:  [name] is a 60 y.o. female . Essential hypertension - Plan: enalapril (VASOTEC) 20 MG tablet, enalapril (VASOTEC) 10 MG tablet, Comprehensive metabolic panel  -add 10mg  vasotec - 30mg  dose total for improved control.   Hypothyroidism, unspecified type - Plan: levothyroxine (SYNTHROID) 100 MCG tablet, TSH  - check labs, continue same dose synthroid for now.   Pre-diabetes - Plan: Lipid panel, Hemoglobin A1c   -with difficulty with weight loss. Check A1c, option for oher weight mgt or follow up with healthy weight and wellness to review goals/concerns.   Vitamin D deficiency - Plan: Vitamin D, 25-hydroxy  - check labs. start with 2000u Qd for now.   Hyperlipidemia, unspecified hyperlipidemia type - Plan: Lipid panel  Visual changes  -eval with optho - numbers provided, referral if needed.   Meds ordered this encounter  Medications  . enalapril (VASOTEC) 20 MG tablet    Sig: Take 1 tablet (20 mg total) by mouth daily.    Dispense:  90 tablet    Refill:  1  . levothyroxine (SYNTHROID) 100 MCG tablet    Sig: Take 1 tablet (100 mcg total) by mouth daily before breakfast.    Dispense:  90 tablet    Refill:  1  . enalapril (VASOTEC) 10 MG tablet    Sig: Take 1  tablet (10 mg total) by mouth daily.    Dispense:  90 tablet    Refill:  1    Combine with 20mg  for total 30mg  dose.   Patient Instructions   Let me know if other resources needed for weight management. I do recommend follow up with [facility], and letting them know your needs.   Over the counter vitamin D 2000 units per day may be effective. Depending on reading today, can discuss need for high dose once per week.   I recommend Dr. at [facility], [facility] eye care are recommendations. Let me know if a referral is needed.   Increase enalapril to 30mg  per day.   Return to the clinic or go to the nearest emergency room if any of your symptoms worsen or new symptoms occur.   If you have lab work done today you will be contacted with your lab results within the next 2 weeks.  If you have not heard from then please contact [facility]. The fastest way to get your results is to register for My Chart.   IF you received an x-ray today, you will receive an invoice from [facility]. Please contact [facility] at [phone] with questions or concerns regarding your invoice.   IF you received labwork today, you will receive an invoice from [facility]. Please contact [facility] at [phone] with questions or concerns regarding your invoice.   Our billing staff will not be able to assist you with questions regarding bills from these companies.  You will be contacted with the lab results as soon as they are available. The fastest way to get your results is to activate your My Chart account. Instructions are located on the last page of this paperwork. If you have not heard from [facility] regarding the results in 2 weeks, please contact this office.         Signed, [facility], MD  [facility] [place]

## 2021-01-04 LAB — COMPREHENSIVE METABOLIC PANEL
ALT: 22 IU/L (ref 0–32)
AST: 20 IU/L (ref 0–40)
Albumin/Globulin Ratio: 1.9 (ref 1.2–2.2)
Albumin: 4.5 g/dL (ref 3.8–4.9)
Alkaline Phosphatase: 62 IU/L (ref 44–121)
BUN/Creatinine Ratio: 21 (ref 12–28)
BUN: 16 mg/dL (ref 8–27)
Bilirubin Total: 0.4 mg/dL (ref 0.0–1.2)
CO2: 20 mmol/L (ref 20–29)
Calcium: 9.5 mg/dL (ref 8.7–10.3)
Chloride: 105 mmol/L (ref 96–106)
Creatinine, Ser: 0.76 mg/dL (ref 0.57–1.00)
GFR calc Af Amer: 99 mL/min/{1.73_m2} (ref >59)
GFR calc non Af Amer: 86 mL/min/{1.73_m2} (ref >59)
Globulin, Total: 2.4 g/dL (ref 1.5–4.5)
Glucose: 100 mg/dL — ABNORMAL HIGH (ref 65–99)
Potassium: 4.7 mmol/L (ref 3.5–5.2)
Sodium: 141 mmol/L (ref 134–144)
Total Protein: 6.9 g/dL (ref 6.0–8.5)

## 2021-01-04 LAB — VITAMIN D 25 HYDROXY (VIT D DEFICIENCY, FRACTURES): Vit D, 25-Hydroxy: 18.9 ng/mL — ABNORMAL LOW (ref 30.0–100.0)

## 2021-01-04 LAB — LIPID PANEL
Chol/HDL Ratio: 4.9 ratio — ABNORMAL HIGH (ref 0.0–4.4)
Cholesterol, Total: 268 mg/dL — ABNORMAL HIGH (ref 100–199)
HDL: 55 mg/dL (ref >39)
LDL Chol Calc (NIH): 165 mg/dL — ABNORMAL HIGH (ref 0–99)
Triglycerides: 256 mg/dL — ABNORMAL HIGH (ref 0–149)
VLDL Cholesterol Cal: 48 mg/dL — ABNORMAL HIGH (ref 5–40)

## 2021-01-04 LAB — HEMOGLOBIN A1C
Est. average glucose Bld gHb Est-mCnc: 117 mg/dL
Hgb A1c MFr Bld: 5.7 % — ABNORMAL HIGH (ref 4.8–5.6)

## 2021-01-04 LAB — TSH: TSH: 3.44 u[IU]/mL (ref 0.450–4.500)

## 2021-04-02 ENCOUNTER — Ambulatory Visit: Payer: BC Managed Care – PPO | Admitting: Family Medicine

## 2021-04-02 ENCOUNTER — Encounter: Payer: Self-pay | Admitting: Family Medicine

## 2021-04-02 ENCOUNTER — Other Ambulatory Visit: Payer: Self-pay

## 2021-04-02 VITALS — BP 132/74 | HR 86 | Temp 98.0°F | Resp 17 | Ht 60.0 in | Wt 184.4 lb

## 2021-04-02 DIAGNOSIS — I10 Essential (primary) hypertension: Secondary | ICD-10-CM

## 2021-04-02 DIAGNOSIS — E559 Vitamin D deficiency, unspecified: Secondary | ICD-10-CM

## 2021-04-02 DIAGNOSIS — M545 Low back pain, unspecified: Secondary | ICD-10-CM | POA: Diagnosis not present

## 2021-04-02 DIAGNOSIS — E039 Hypothyroidism, unspecified: Secondary | ICD-10-CM | POA: Diagnosis not present

## 2021-04-02 DIAGNOSIS — R7303 Prediabetes: Secondary | ICD-10-CM

## 2021-04-02 MED ORDER — LEVOTHYROXINE SODIUM 100 MCG PO TABS: 100.0000 ug | ORAL_TABLET | Freq: Every day | ORAL | 1 refills | Status: DC

## 2021-04-02 MED ORDER — MELOXICAM 7.5 MG PO TABS: 7.5000 mg | ORAL_TABLET | Freq: Every day | ORAL | 0 refills | Status: DC | PRN

## 2021-04-02 MED ORDER — ENALAPRIL MALEATE 20 MG PO TABS: 20.0000 mg | ORAL_TABLET | Freq: Every day | ORAL | 1 refills | Status: DC

## 2021-04-02 NOTE — Progress Notes (Signed)
Subjective:  Patient ID: [name], female    DOB: Oct 06, 1961  Age: 60 y.o. MRN: [id number]  CC:  Chief Complaint  Patient presents with  . Back Pain    Pt reports back pain worse now with higher body weight, has previously taken meloxicam for this which helped. Pt reports this flare starting in December.   . Hypertension    Pt reports doing okay denied physical symptoms, pt does not take BP consistently at home and has not recently. Pt reports still taking 20 mg rather than 30 due to leg cramps brought on when taking extra 10 mg     HPI [name] presents for   Hypertension: Taking 20mg  qd enalapril. Leg cramps at 30mg  - better at 20mg .  Home readings: 130/70. Less stress currently.  BP Readings from Last 3 Encounters:  04/02/21 132/74  01/03/21 (!) 150/81  08/27/20 (!) 150/87   Lab Results  Component Value Date   CREATININE 0.76 01/03/2021    Hypothyroidism: Lab Results  Component Value Date   TSH 3.440 01/03/2021  Taking medication daily. Synthroid 10/27/20 qd.  No new hot or cold intolerance. No new hair or skin changes, heart palpitations or new fatigue. No new weight changes.    Hyperlipidemia Diet and exercise approach planned. Did not tolerate statin in past.   The 10-year ASCVD risk score 01/05/2021 DC 01/05/2021., et al., 2013) is: 5.9%   Values used to calculate the score:     Age: 55 years     Sex: Female     Is Non-Hispanic African American: No     Diabetic: No     Tobacco smoker: No     Systolic Blood Pressure: 132 mmHg     Is BP treated: Yes     HDL Cholesterol: 55 mg/dL     Total Cholesterol: 268 mg/dL  Lab Results  Component Value Date   CHOL 268 (H) 01/03/2021   HDL 55 01/03/2021   LDLCALC 165 (H) 01/03/2021   TRIG 256 (H) 01/03/2021   CHOLHDL 4.9 (H) 01/03/2021   Lab Results  Component Value Date   ALT 22 01/03/2021   AST 20 01/03/2021   ALKPHOS 62 01/03/2021   BILITOT 0.4 01/03/2021      Prediabetes: Exercise - limited by  back pain.  Has appt with [facility] - mid June. New provider. Considering 01/05/2021. Will d/w weight management specialist.  Taking vitamin D supplement weekly.  Lab Results  Component Value Date   HGBA1C 5.7 (H) 01/03/2021   Wt Readings from Last 3 Encounters:  04/02/21 184 lb 6.4 oz (83.6 kg)  01/03/21 182 lb (82.6 kg)  08/27/20 175 lb (79.4 kg)   Back pain: For years. Hx of spinal fusion after vertebrae injury in 20's. Has Harrington rods.  Last saw spine center 3-4 years ago - surgery not recommended unless severe pain/debility. Pain increases as weight increases.  Took meloxicam 3-4 years ago - felt like this helped.  Currently taking aspirin (no relief with ibuprofen). 3-4 per day, daily.  No bowel or bladder incontinence, no saddle anesthesia, no lower extremity weakness.  Pain radiates to R buttocks at times, muscle pain in legs with change in gait due to pain. No assistive device.  PT for back about 4 years ago. Plans on being back in pool in few weeks, and will call and schedule PT - will let me know.      History Patient Active Problem List  Diagnosis Date Noted  . Elevated ALT measurement 04/04/2020  . Prediabetes 06/09/2017  . 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 Hyperlipidemia 10/21/2016  . Obesity 02/15/2014  . 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 Hypothyroid 06/10/2012  . HTN (hypertension) 06/10/2012   Past Medical History:  Diagnosis Date  . Allergy    dust mite allergies; Zyrtec.  [name] Blood transfusion without reported diagnosis    post-operatively in 20s after L1 fracture with spinal cord injury after horseback riding.  . Chronic kidney disease    Tuberous Sclerosis; [facility] at [facility].  [name] Hyperlipidemia   . Hypertension   . Hypothyroidism   . Lower back pain   . Thyroid disease    Past Surgical History:  Procedure Laterality Date  . ABDOMINAL HYSTERECTOMY     DUB; ovaries remaining.  [name]  CESAREAN SECTION    . MENISCUS REPAIR    . Sleep Study  12/17/2010   No sleep disordered breathing or periodic limb mvmets.  Excess light sleep.  [name] SPINE SURGERY     L1 fracture with spinal cord injury with horseback riding.  . TUBAL LIGATION     Allergies  Allergen Reactions  . Sulfa Antibiotics Hives  . Lipitor [Atorvastatin] Other (See Comments)    Fatigue and confusion   Prior to Admission medications   Medication Sig Start Date End Date Taking? Authorizing Provider  enalapril (VASOTEC) 20 MG tablet Take 1 tablet (20 mg total) by mouth daily. 01/03/21  Yes [name], MD  levothyroxine (SYNTHROID) 100 MCG tablet Take 1 tablet (100 mcg total) by mouth daily before breakfast. 01/03/21  Yes [name], MD  Vitamin D, Ergocalciferol, (DRISDOL) 1.25 MG (50000 UNIT) CAPS capsule TAKE 1 CAPSULE (50,000 UNITS TOTAL) BY MOUTH EVERY 7 DAYS. 12/19/20  Yes [name], MD   Social History   Socioeconomic History  . Marital status: Married    Spouse name: Burnis Kingfisher  . Number of children: Not on file  . Years of education: Not on file  . Highest education level: Not on file  Occupational History  . Occupation: Tourist information centre manager  Tobacco Use  . Smoking status: Never Smoker  . Smokeless tobacco: Never Used  Substance and Sexual Activity  . Alcohol use: Yes    Comment: occ  . Drug use: No  . Sexual activity: Yes    Birth control/protection: None  Other Topics Concern  . Not on file  Social History Narrative   Marital status: married x 20+ years; happily married.      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      Tobacco; none      Alcohol:  Socially      Drugs: none      Exercise: sporadic   Social Determinants of Health   Financial Resource Strain: Not on file  Food Insecurity: Not on file  Transportation Needs: Not on file  Physical Activity: Not on file  Stress: Not on  file  Social Connections: Not on file  Intimate Partner Violence: Not on file    Review of Systems Per HPI   Objective:   Vitals:   04/02/21 1045  BP: 132/74  Pulse: 86  Resp: 17  Temp: 98 F (36.7 C)  TempSrc: Temporal  SpO2: 97%  Weight: 184 lb 6.4 oz (83.6 kg)  Height:  5' (1.524 m)     Physical Exam Vitals reviewed.  Constitutional:      General: She is not in acute distress.    Appearance: She is well-developed.  HENT:     Head: Normocephalic and atraumatic.  Eyes:     Conjunctiva/sclera: Conjunctivae normal.     Pupils: Pupils are equal, round, and reactive to light.  Neck:     Vascular: No carotid bruit.  Cardiovascular:     Rate and Rhythm: Normal rate and regular rhythm.     Heart sounds: Normal heart sounds.  Pulmonary:     Effort: Pulmonary effort is normal.     Breath sounds: Normal breath sounds.  Abdominal:     Palpations: Abdomen is soft. There is no pulsatile mass.     Tenderness: There is no abdominal tenderness.  Musculoskeletal:     Comments: No midline bony tenderness of lumbar spine, equal rotation, slight decreased extension.  Able to heel and toe walk without difficulty.  Reports area of discomfort towards the right sciatic notch.  SI joints nontender.  Negative seated straight leg raise.  Skin:    General: Skin is warm and dry.  Neurological:     Mental Status: She is alert and oriented to person, place, and time.  Psychiatric:        Behavior: Behavior normal.      Assessment & Plan:  Deanna BatheMargaret L Wimer is a 60 y.o. female . Low back pain, unspecified back pain laterality, unspecified chronicity, unspecified whether sciatica present - Plan: meloxicam (MOBIC) 7.5 MG tablet  -With prior lumbar surgery/months.  Trial of repeat physical therapy, improved with exercise.  Can place referral for PT if needed but she plans to call first.  Short-term meloxicam discussed with potential risks and side effects discussed.  Consider specialist or  physical medicine rehab if persistent difficulty.  Hypothyroidism, unspecified type - Plan: levothyroxine (SYNTHROID) 100 MCG tablet  -Stable in February labs, continue same, repeat testing in 3 months.  Essential hypertension - Plan: enalapril (VASOTEC) 20 MG tablet  -Stable on 20 mg dose enalapril, continue same, labs at next visit  Vitamin D deficiency Pre-diabetes  -Follow-up planned with [facility].  Did recommend she discuss weight loss medication at that time.  Planned vitamin D recheck at [facility], labs deferred today, continue vitamin D supplement once per week.  A1c will be due within the next 3 months but likely will be checked with healthy [facility].  Meds ordered this encounter  Medications  . levothyroxine (SYNTHROID) 100 MCG tablet    Sig: Take 1 tablet (100 mcg total) by mouth daily before breakfast.    Dispense:  90 tablet    Refill:  1  . enalapril (VASOTEC) 20 MG tablet    Sig: Take 1 tablet (20 mg total) by mouth daily.    Dispense:  90 tablet    Refill:  1  . meloxicam (MOBIC) 7.5 MG tablet    Sig: Take 1 tablet (7.5 mg total) by mouth daily as needed for pain.    Dispense:  30 tablet    Refill:  0   Patient Instructions  No change in medications at this time.  Keep follow-up with [facility] as planned.  They likely will do repeat labs including vitamin D.  Continue supplement.  Meloxicam if needed short-term for back pain but I think following up with physical therapy and pool-based exercise may be helpful.  Schedule  appoint with me in the next 6 weeks if not improving or I am happy to refer you to other back specialist if needed.  Let me know.  Thank you for coming in today and take care.     Signed, Deanna Staggers, MD [facility]

## 2021-04-02 NOTE — Patient Instructions (Signed)
No change in medications at this time.  Keep follow-up with healthy [facility] wellness as planned.  They likely will do repeat labs including vitamin D.  Continue supplement.  Meloxicam if needed short-term for back pain but I think following up with physical therapy and pool-based exercise may be helpful.  Schedule appoint with me in the next 6 weeks if not improving or I am happy to refer you to other back specialist if needed.  Let me know.  Thank you for coming in today and take care.

## 2021-04-22 DIAGNOSIS — U071 COVID-19: Secondary | ICD-10-CM | POA: Insufficient documentation

## 2021-04-24 ENCOUNTER — Encounter: Payer: Self-pay | Admitting: Family Medicine

## 2021-04-25 ENCOUNTER — Other Ambulatory Visit: Payer: Self-pay

## 2021-04-25 ENCOUNTER — Telehealth (INDEPENDENT_AMBULATORY_CARE_PROVIDER_SITE_OTHER): Payer: BC Managed Care – PPO | Admitting: Registered Nurse

## 2021-04-25 ENCOUNTER — Encounter: Payer: Self-pay | Admitting: Registered Nurse

## 2021-04-25 DIAGNOSIS — U071 COVID-19: Secondary | ICD-10-CM

## 2021-04-25 MED ORDER — MOLNUPIRAVIR EUA 200MG CAPSULE: 4.0000 | ORAL_CAPSULE | Freq: Two times a day (BID) | ORAL | 0 refills | Status: AC

## 2021-04-25 NOTE — Telephone Encounter (Signed)
LM asking pt to call back to schedule a mychart visit ELE[facility]  °

## 2021-04-25 NOTE — Progress Notes (Signed)
Telemedicine Encounter- SOAP NOTE Established Patient  This telephone encounter was conducted with the patient's (or proxy's) verbal consent via audio telecommunications: yes/no: Yes Patient was instructed to have this encounter in a suitably private space; and to only have persons present to whom they give permission to participate. In addition, patient identity was confirmed by use of name plus two identifiers (DOB and address).  I discussed the limitations, risks, security and privacy concerns of performing an evaluation and management service by telephone and the availability of in person appointments. I also discussed with the patient that there may be a patient responsible charge related to this service. The patient expressed understanding and agreed to proceed.  I spent a total of TIME; 0 MIN TO 60 MIN: 15 minutes talking with the patient or their proxy.  No chief complaint on file.   Subjective   [name] is a 60 y.o. established patient. Telephone visit today for COVID  HPI Tested positive Tuesday Symptoms - PND have been intermittent and unclear on onset, perhaps Monday PND leading to mild cough when lying down Denies fevers chills fatigue nvd sensory disturbances and other symptoms Wants to make sure she is at lowest risk possible for spreading this to her husband Also notes she has appt at [facility] Tuesday -wants guidelines  Did receive vaccine and boosted x 2    Notes that Dr. Neva Seat had discussed referral to spine specialist in the past - would like to pursue this now. Will CC Dr. Neva Seat on the visit to have this referral placed.  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   Blood transfusion without reported diagnosis    post-operatively in 20s after L1 fracture with spinal cord injury after horseback riding.   Chronic kidney disease    Tuberous Sclerosis; [facility] at [facility].   Hyperlipidemia    Hypertension    Hypothyroidism    Lower back pain    Thyroid disease     Current Outpatient Medications  Medication Sig Dispense Refill   enalapril (VASOTEC) 20 MG tablet Take 1 tablet (20 mg total) by mouth daily. 90 tablet 1   levothyroxine (SYNTHROID) 100 MCG tablet Take 1 tablet (100 mcg total) by mouth daily before breakfast. 90 tablet 1   meloxicam (MOBIC) 7.5 MG tablet Take 1 tablet (7.5 mg total) by mouth daily as needed for pain. 30 tablet 0   Vitamin D, Ergocalciferol, (DRISDOL) 1.25 MG (50000 UNIT) CAPS capsule TAKE 1 CAPSULE (50,000 UNITS TOTAL) BY MOUTH EVERY 7 DAYS. 12 capsule 1   No current facility-administered medications for this visit.    Allergies  Allergen Reactions   Sulfa Antibiotics Hives   Lipitor [Atorvastatin] Other (See Comments)    Fatigue and confusion    Social History   Socioeconomic History   Marital status: Married    Spouse name: Burnis Kingfisher   Number of children: Not on file   Years of education: Not on file   Highest education level: Not on file  Occupational History   Occupation: Tourist information centre manager  Tobacco Use   Smoking  status: Never   Smokeless tobacco: Never  Substance and Sexual Activity   Alcohol use: Yes    Comment: occ   Drug use: No   Sexual activity: Yes    Birth control/protection: None  Other Topics Concern   Not on file  Social History Narrative   Marital status: married x 20+ years; happily married.      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      Tobacco; none      Alcohol:  Socially       Drugs: none      Exercise: sporadic   Social Determinants of Health   Financial Resource Strain: Not on file  Food Insecurity: Not on file  Transportation Needs: Not on file  Physical Activity: Not on file  Stress: Not on file  Social Connections: Not on file  Intimate Partner Violence: Not on file    Review of Systems  Constitutional: Negative.   HENT:  Positive for congestion.   Eyes: Negative.   Respiratory: Negative.    Cardiovascular: Negative.   Gastrointestinal: Negative.   Genitourinary: Negative.   Musculoskeletal: Negative.   Skin: Negative.   Neurological: Negative.   Endo/Heme/Allergies: Negative.   Psychiatric/Behavioral: Negative.     Objective   Vitals as reported by the patient: There were no vitals filed for this visit.  There are no diagnoses linked to this encounter.  PLAN Molnupiravir to limit risk for infecting those around her Declines symptom management at this time Return if worsening or failing to improve. Reviewed ER precautions with pt who voices understanding Patient encouraged to call clinic with any questions, comments, or concerns.  I discussed the assessment and treatment plan with the patient. The patient was provided an opportunity to ask questions and all were answered. The patient agreed with the plan and demonstrated an understanding of the instructions.   The patient was advised to call back or seek an in-person evaluation if the symptoms worsen or if the condition fails to improve as anticipated.  I provided 15 minutes of non-face-to-face time during this encounter.  Janeece Agee, NP  Primary [facility] at [facility]

## 2021-04-29 ENCOUNTER — Ambulatory Visit (INDEPENDENT_AMBULATORY_CARE_PROVIDER_SITE_OTHER): Payer: BC Managed Care – PPO | Admitting: Family Medicine

## 2021-05-13 ENCOUNTER — Ambulatory Visit (INDEPENDENT_AMBULATORY_CARE_PROVIDER_SITE_OTHER): Payer: BC Managed Care – PPO | Admitting: Family Medicine

## 2021-05-28 ENCOUNTER — Encounter (INDEPENDENT_AMBULATORY_CARE_PROVIDER_SITE_OTHER): Payer: Self-pay | Admitting: Family Medicine

## 2021-05-28 ENCOUNTER — Other Ambulatory Visit: Payer: Self-pay

## 2021-05-28 ENCOUNTER — Ambulatory Visit (INDEPENDENT_AMBULATORY_CARE_PROVIDER_SITE_OTHER): Payer: BC Managed Care – PPO | Admitting: Family Medicine

## 2021-05-28 VITALS — BP 144/84 | HR 75 | Temp 98.2°F | Ht 60.0 in | Wt 183.0 lb

## 2021-05-28 DIAGNOSIS — R5383 Other fatigue: Secondary | ICD-10-CM | POA: Diagnosis not present

## 2021-05-28 DIAGNOSIS — Z1331 Encounter for screening for depression: Secondary | ICD-10-CM

## 2021-05-28 DIAGNOSIS — E782 Mixed hyperlipidemia: Secondary | ICD-10-CM

## 2021-05-28 DIAGNOSIS — R7303 Prediabetes: Secondary | ICD-10-CM | POA: Diagnosis not present

## 2021-05-28 DIAGNOSIS — R0602 Shortness of breath: Secondary | ICD-10-CM

## 2021-05-28 DIAGNOSIS — Z9189 Other specified personal risk factors, not elsewhere classified: Secondary | ICD-10-CM

## 2021-05-28 DIAGNOSIS — I10 Essential (primary) hypertension: Secondary | ICD-10-CM | POA: Diagnosis not present

## 2021-05-28 DIAGNOSIS — Z0289 Encounter for other administrative examinations: Secondary | ICD-10-CM

## 2021-05-28 DIAGNOSIS — Z6835 Body mass index (BMI) 35.0-35.9, adult: Secondary | ICD-10-CM

## 2021-05-28 MED ORDER — BD PEN NEEDLE NANO U/F 32G X 4 MM MISC: 0 refills | Status: DC

## 2021-05-28 MED ORDER — SAXENDA 18 MG/3ML ~~LOC~~ SOPN: 3.0000 mg | PEN_INJECTOR | SUBCUTANEOUS | 0 refills | Status: DC

## 2021-05-29 ENCOUNTER — Encounter (INDEPENDENT_AMBULATORY_CARE_PROVIDER_SITE_OTHER): Payer: Self-pay

## 2021-05-29 ENCOUNTER — Other Ambulatory Visit: Payer: Self-pay | Admitting: Family Medicine

## 2021-05-29 ENCOUNTER — Other Ambulatory Visit: Payer: Self-pay

## 2021-05-29 DIAGNOSIS — M545 Low back pain, unspecified: Secondary | ICD-10-CM

## 2021-05-29 MED ORDER — MELOXICAM 7.5 MG PO TABS: 7.5000 mg | ORAL_TABLET | Freq: Every day | ORAL | 0 refills | Status: DC | PRN

## 2021-05-29 NOTE — Telephone Encounter (Signed)
Referral ordered.  Thanks

## 2021-06-04 NOTE — Progress Notes (Signed)
Chief Complaint:   [name] (MR# [id number]) is a 60 y.o. female who presents for evaluation and treatment of Deanna and related comorbidities. Current BMI is Body mass index is 35.74 kg/m. [name] has been struggling with her weight for many years and has been unsuccessful in either losing weight, maintaining weight loss, or reaching her healthy weight goal.  [name] is a returning patient to our clinic after a hiatus from last year. She is ready to start working on weight loss again.  [name] is currently in the action stage of change and ready to dedicate time achieving and maintaining a healthier weight. [name] is interested in becoming our patient and working on intensive lifestyle modifications including (but not limited to) diet and exercise for weight loss.  [name]'s habits were reviewed today and are as follows: Her family eats meals together, she thinks her family will eat healthier with her, her desired weight loss is 33 lbs, she has been heavy most of her life, she started gaining weight after child birth, her heaviest weight ever was 195 pounds, she has significant food cravings issues, she snacks frequently in the evenings, she is frequently drinking liquids with calories, she frequently makes poor food choices, she has problems with excessive hunger, she frequently eats larger portions than normal, and she struggles with emotional eating.  Depression Screen [name]'s Food and Mood (modified PHQ-9) score was 5.  Depression screen PHQ 2/9 05/28/2021  Decreased Interest 1  Down, Depressed, Hopeless 1  PHQ - 2 Score 2  Altered sleeping 0  Tired, decreased energy 1  Change in appetite 1  Feeling bad or failure about yourself  1  Trouble concentrating 0  Moving slowly or fidgety/restless 0  Suicidal thoughts 0  PHQ-9 Score 5  Difficult doing work/chores Not difficult at all   Subjective:   1. Other fatigue [name] admits to daytime  somnolence and denies waking up still tired. Patent has a history of symptoms of daytime fatigue and morning headache. [name] generally gets 8 hours of sleep per night, and states that she has generally restful sleep. Snoring is present. Apneic episodes are not present. Epworth Sleepiness Score is 7.  2. SOB (shortness of breath) on exertion [name] notes increasing shortness of breath with exercising and seems to be worsening over time with weight gain. She notes getting out of breath sooner with activity than she used to. This has not gotten worse recently. [name] denies shortness of breath at rest or orthopnea.  3. Pre-diabetes [name] has a elevated A1c and polyphagia. She is working on diet and exercise.  4. Essential hypertension [name]'s blood pressure is elevated today. This may be due to this being her first visit again.  5. Mixed hyperlipidemia [name]'s triglycerides and LDL are both elevated. She is not on a statin.  6. At risk for diabetes mellitus [name] is at higher than average risk for developing diabetes due to Deanna.   Assessment/Plan:   1. Other fatigue [name] does feel that her weight is causing her energy to be lower than it should be. Fatigue may be related to Deanna, depression or many other causes. Labs will be ordered, and in the meanwhile, [name] will focus on self care including making healthy food choices, increasing physical activity and focusing on stress reduction.  2. SOB (shortness of breath) on exertion [name] does feel that she gets out of breath more easily that she used to when she exercises. [name]'s shortness of breath appears  to be Deanna related and exercise induced. She has agreed to work on weight loss and gradually increase exercise to treat her exercise induced shortness of breath. Will continue to monitor closely.  3. Pre-diabetes [name] is to start GLP-1, and will see if her insurance covers it but she will start a  journaling plan in the meanwhile. She will continue to work on weight loss, exercise, and decreasing simple carbohydrates to help decrease the risk of diabetes.   4. Essential hypertension [name] will continue with diet, exercise, and her medications to improve blood pressure control. We will recheck her blood pressure in 2 weeks.  - EKG 12-Lead  5. Mixed hyperlipidemia Cardiovascular risk and specific lipid/LDL goals reviewed. We discussed several lifestyle modifications today. [name] will continue to work on diet, exercise and weight loss efforts. We will recheck her labs in 3 months. Orders and follow up as documented in patient record.   6. Screening for depression [name] had a positive depression screening. Depression is commonly associated with Deanna and often results in emotional eating behaviors. We will monitor this closely and work on CBT to help improve the non-hunger eating patterns. Referral to Psychology may be required if no improvement is seen as she continues in our clinic.  7. At risk for diabetes mellitus [name] was given approximately 15 minutes of diabetes education and counseling today. We discussed intensive lifestyle modifications today with an emphasis on weight loss as well as increasing exercise and decreasing simple carbohydrates in her diet. We also reviewed medication options with an emphasis on risk versus benefit of those discussed.   Repetitive spaced learning was employed today to elicit superior memory formation and behavioral change.  8. Deanna with current BMI 35.8 [name] is currently in the action stage of change and her goal is to continue with weight loss efforts. I recommend [name] begin the structured treatment plan as follows:  She has agreed to keeping a food journal and adhering to recommended goals of 1100-1400 calories and 80+ grams of protein daily.  We discussed various medication options to help [name] with her weight loss  efforts and we both agreed to start Saxenda 3.0 mg (patient to start at 0.6 mg a daily), and nano needles #100 with no refills.  - Liraglutide -Weight Management (SAXENDA) 18 MG/3ML SOPN; Inject 3 mg into the skin once a week. Start @ 0.6 in skin daily  Dispense: 15 mL; Refill: 0 - Insulin Pen Needle (BD PEN NEEDLE NANO U/F) 32G X 4 MM MISC; Use Nano needle with Ozempic  Dispense: 100 each; Refill: 0  Exercise goals: No exercise has been prescribed for now, while we concentrate on nutritional changes.  Behavioral modification strategies: increasing lean protein intake, meal planning and cooking strategies, and keeping a strict food journal.  She was informed of the importance of frequent follow-up visits to maximize her success with intensive lifestyle modifications for her multiple health conditions. She was informed we would discuss her lab results at her next visit unless there is a critical issue that needs to be addressed sooner. [name] agreed to keep her next visit at the agreed upon time to discuss these results.  Objective:   Blood pressure (!) 144/84, pulse 75, temperature 98.2 F (36.8 [name]), height 5' (1.524 m), weight 183 lb (83 kg), SpO2 96 %. Body mass index is 35.74 kg/m.  EKG: Normal sinus rhythm, rate 77 BPM.  Indirect Calorimeter completed today shows a VO2 of 239 and a REE of 1662.  Her  calculated basal metabolic rate is 2505 thus her basal metabolic rate is better than expected.  General: Cooperative, alert, well developed, in no acute distress. HEENT: Conjunctivae and lids unremarkable. Cardiovascular: Regular rhythm.  Lungs: Normal work of breathing. Neurologic: No focal deficits.   Lab Results  Component Value Date   CREATININE 0.76 01/03/2021   BUN 16 01/03/2021   NA 141 01/03/2021   K 4.7 01/03/2021   CL 105 01/03/2021   CO2 20 01/03/2021   Lab Results  Component Value Date   ALT 22 01/03/2021   AST 20 01/03/2021   ALKPHOS 62 01/03/2021   BILITOT 0.4  01/03/2021   Lab Results  Component Value Date   HGBA1C 5.7 (H) 01/03/2021   HGBA1C 5.7 (H) 06/03/2020   HGBA1C 5.7 (H) 04/03/2020   HGBA1C 5.6 09/09/2018   HGBA1C 5.9 (H) 10/22/2016   Lab Results  Component Value Date   INSULIN 12.0 04/03/2020   Lab Results  Component Value Date   TSH 3.440 01/03/2021   Lab Results  Component Value Date   CHOL 268 (H) 01/03/2021   HDL 55 01/03/2021   LDLCALC 165 (H) 01/03/2021   TRIG 256 (H) 01/03/2021   CHOLHDL 4.9 (H) 01/03/2021   Lab Results  Component Value Date   WBC 6.3 06/03/2020   HGB 14.6 06/03/2020   HCT 43.4 06/03/2020   MCV 97 06/03/2020   PLT 314 06/03/2020   No results found for: IRON, TIBC, FERRITIN  Attestation Statements:   Reviewed by clinician on day of visit: allergies, medications, problem list, medical history, surgical history, family history, social history, and previous encounter notes.  Time spent on visit including pre-visit chart review and post-visit charting and care was 42 minutes.    I, Burt Knack, am acting as transcriptionist for Quillian Quince, MD.  I have reviewed the above documentation for accuracy and completeness, and I agree with the above. - Quillian Quince, MD

## 2021-06-11 ENCOUNTER — Encounter (INDEPENDENT_AMBULATORY_CARE_PROVIDER_SITE_OTHER): Payer: Self-pay | Admitting: Family Medicine

## 2021-06-11 ENCOUNTER — Ambulatory Visit (INDEPENDENT_AMBULATORY_CARE_PROVIDER_SITE_OTHER): Payer: BC Managed Care – PPO | Admitting: Family Medicine

## 2021-06-11 ENCOUNTER — Other Ambulatory Visit: Payer: Self-pay

## 2021-06-11 VITALS — BP 135/86 | HR 70 | Temp 98.0°F | Ht 60.0 in | Wt 176.0 lb

## 2021-06-11 DIAGNOSIS — R7303 Prediabetes: Secondary | ICD-10-CM | POA: Diagnosis not present

## 2021-06-11 DIAGNOSIS — E559 Vitamin D deficiency, unspecified: Secondary | ICD-10-CM

## 2021-06-11 DIAGNOSIS — Z9189 Other specified personal risk factors, not elsewhere classified: Secondary | ICD-10-CM

## 2021-06-11 DIAGNOSIS — Z6835 Body mass index (BMI) 35.0-35.9, adult: Secondary | ICD-10-CM | POA: Diagnosis not present

## 2021-06-11 MED ORDER — VITAMIN D (ERGOCALCIFEROL) 1.25 MG (50000 UNIT) PO CAPS: ORAL_CAPSULE | ORAL | 0 refills | Status: DC

## 2021-06-17 NOTE — Progress Notes (Signed)
Chief Complaint:   OBESITY [name] is here to discuss her progress with her obesity treatment plan along with follow-up of her obesity related diagnoses. [name] is on keeping a food journal and adhering to recommended goals of 1100-1400 calories and 80+ grams of protein daily and states she is following her eating plan approximately 99% of the time. [name] states she is doing 0 minutes 0 times per week.  Today's visit was #: 2 Starting weight: 183 lbs Starting date: 05/28/2021 Today's weight: 176 lbs Today's date: 06/11/2021 Total lbs lost to date: 7 Total lbs lost since last in-office visit: 7  Interim History: [name] has done well with weight loss. She did very well with journaling and meeting her protein goal. She kept her calories ideal most of the time, and her hunger was mostly controlled. She is tolerating Saxenda well.  Subjective:   1. Vitamin D deficiency [name] is stable on Vit D, and she denies nausea, vomiting, or muscle weakness. Her Vit D level is not yet at goal.  2. Pre-diabetes [name] is stable on GLP-1, and she is doing very well with diet, exercise, and weight loss.  3. At risk for diabetes mellitus [name] is at higher than average risk for developing diabetes due to obesity.   Assessment/Plan:   1. Vitamin D deficiency Low Vitamin D level contributes to fatigue and are associated with obesity, breast, and colon cancer. We will refill prescription Vitamin D 50,000 IU every week for 1 month. [name] will follow-up for routine testing of Vitamin D, at least 2-3 times per year to avoid over-replacement.  - Vitamin D, Ergocalciferol, (DRISDOL) 1.25 MG (50000 UNIT) CAPS capsule; TAKE 1 CAPSULE (50,000 UNITS TOTAL) BY MOUTH EVERY 7 DAYS.  Dispense: 4 capsule; Refill: 0  2. Pre-diabetes [name] will continue diet, exercise, weight loss, and decreasing simple carbohydrates to help decrease the risk of diabetes. We will continue to follow  closely.  3. At risk for diabetes mellitus [name] was given approximately 30 minutes of diabetes education and counseling today. We discussed intensive lifestyle modifications today with an emphasis on weight loss as well as increasing exercise and decreasing simple carbohydrates in her diet. We also reviewed medication options with an emphasis on risk versus benefit of those discussed.   Repetitive spaced learning was employed today to elicit superior memory formation and behavioral change.  4. Obesity with current BMI 34.4 [name] is currently in the action stage of change. As such, her goal is to continue with weight loss efforts. She has agreed to keeping a food journal and adhering to recommended goals of 1100-1400 calories and 80+ grams of protein daily.   We discussed various medication options to help [name] with her weight loss efforts and we both agreed to continue Saxenda at 0.6 mg until her next visit.  Behavioral modification strategies: increasing lean protein intake, dealing with family or coworker sabotage, and travel eating strategies.  [name] has agreed to follow-up with our clinic in 2 to 3 weeks. She was informed of the importance of frequent follow-up visits to maximize her success with intensive lifestyle modifications for her multiple health conditions.   Objective:   Blood pressure 135/86, pulse 70, temperature 98 F (36.7 C), height 5' (1.524 m), weight 176 lb (79.8 kg), SpO2 97 %. Body mass index is 34.37 kg/m.  General: Cooperative, alert, well developed, in no acute distress. HEENT: Conjunctivae and lids unremarkable. Cardiovascular: Regular rhythm.  Lungs: Normal work of breathing. Neurologic: No focal deficits.  Lab Results  Component Value Date   CREATININE 0.76 01/03/2021   BUN 16 01/03/2021   NA 141 01/03/2021   K 4.7 01/03/2021   CL 105 01/03/2021   CO2 20 01/03/2021   Lab Results  Component Value Date   ALT 22 01/03/2021   AST 20  01/03/2021   ALKPHOS 62 01/03/2021   BILITOT 0.4 01/03/2021   Lab Results  Component Value Date   HGBA1C 5.7 (H) 01/03/2021   HGBA1C 5.7 (H) 06/03/2020   HGBA1C 5.7 (H) 04/03/2020   HGBA1C 5.6 09/09/2018   HGBA1C 5.9 (H) 10/22/2016   Lab Results  Component Value Date   INSULIN 12.0 04/03/2020   Lab Results  Component Value Date   TSH 3.440 01/03/2021   Lab Results  Component Value Date   CHOL 268 (H) 01/03/2021   HDL 55 01/03/2021   LDLCALC 165 (H) 01/03/2021   TRIG 256 (H) 01/03/2021   CHOLHDL 4.9 (H) 01/03/2021   Lab Results  Component Value Date   VD25OH 18.9 (L) 01/03/2021   VD25OH 29.4 (L) 06/27/2020   VD25OH 20.0 (L) 04/03/2020   Lab Results  Component Value Date   WBC 6.3 06/03/2020   HGB 14.6 06/03/2020   HCT 43.4 06/03/2020   MCV 97 06/03/2020   PLT 314 06/03/2020   No results found for: IRON, TIBC, FERRITIN  Attestation Statements:   Reviewed by clinician on day of visit: allergies, medications, problem list, medical history, surgical history, family history, social history, and previous encounter notes.   I, Burt Knack, am acting as transcriptionist for Quillian Quince, MD.  I have reviewed the above documentation for accuracy and completeness, and I agree with the above. -  Quillian Quince, MD

## 2021-06-25 ENCOUNTER — Encounter (INDEPENDENT_AMBULATORY_CARE_PROVIDER_SITE_OTHER): Payer: Self-pay | Admitting: Adult Health

## 2021-06-25 ENCOUNTER — Ambulatory Visit (INDEPENDENT_AMBULATORY_CARE_PROVIDER_SITE_OTHER): Payer: BC Managed Care – PPO | Admitting: Adult Health

## 2021-06-25 ENCOUNTER — Other Ambulatory Visit: Payer: Self-pay

## 2021-06-25 VITALS — BP 128/86 | HR 89 | Temp 98.6°F | Ht 60.0 in | Wt 175.0 lb

## 2021-06-25 DIAGNOSIS — Z9189 Other specified personal risk factors, not elsewhere classified: Secondary | ICD-10-CM | POA: Diagnosis not present

## 2021-06-25 DIAGNOSIS — R7303 Prediabetes: Secondary | ICD-10-CM | POA: Diagnosis not present

## 2021-06-25 DIAGNOSIS — Z6835 Body mass index (BMI) 35.0-35.9, adult: Secondary | ICD-10-CM

## 2021-06-25 DIAGNOSIS — I10 Essential (primary) hypertension: Secondary | ICD-10-CM | POA: Diagnosis not present

## 2021-06-25 MED ORDER — SAXENDA 18 MG/3ML ~~LOC~~ SOPN: 3.0000 mg | PEN_INJECTOR | SUBCUTANEOUS | 0 refills | Status: DC

## 2021-06-27 NOTE — Progress Notes (Signed)
Chief Complaint:   OBESITY [name] is here to discuss her progress with her obesity treatment plan along with follow-up of her obesity related diagnoses. [name] is keeping a food journal and adhering to recommended goals of 1100-1400 calories and 80+ protein and states she is following her eating plan approximately 40% of the time. [name] states she is not currently exercising.  Today's visit was #: 3 Starting weight: 183 lbs Starting date: 05/28/2021 Today's weight: 175 lbs Today's date: 06/25/2021 Total lbs lost to date: 8 lbs Total lbs lost since last in-office visit: 1 lb  Interim History: [name] started, 05/28/21, taking Saxenda 0.6 mg daily. She reports tolerating the dosage well; however, she is still experiencing  polyphagia.  Subjective:   1. Pre-diabetes [name] has a diagnosis of prediabetes based on her elevated HgA1c of 5.7 with an elevated blood glucose of 100. She was informed this puts her at greater risk of developing diabetes. She continues to work on diet and exercise to decrease her risk of diabetes. She denies nausea or hypoglycemia. [name] started [place] .06 mg, daily, on 05/28/21 and is tolerating it well, however still experiencing polyphagia. She denies mass in neck, dyspepsia, or persistent hoarseness.  Lab Results  Component Value Date   HGBA1C 5.7 (H) 01/03/2021   Lab Results  Component Value Date   INSULIN 12.0 04/03/2020   2. Essential hypertension [name]'s blood pressure and heart rate are excellent at today's office visit.  She is currently taking Vasotec 20 mg daily. Review: taking medications as instructed, no medication side effects noted, no chest pain on exertion, no dyspnea on exertion, no swelling of ankles.   BP Readings from Last 3 Encounters:  06/25/21 128/86  06/11/21 135/86  05/28/21 (!) 144/84   3. At risk for constipation Sharlisa is at increased risk for constipation due to inadequate water intake, changes in diet,  and/or use of medications such as GLP1 agonists. [name] denies hard, infrequent stools currently.    Assessment/Plan:   1. Pre-diabetes Louretta will continue to work on weight loss, exercise, and decreasing simple carbohydrates to help decrease the risk of diabetes. She will increase her Saxenda to 1.2 mg daily and hold at that dose. Fasting labs to be drawn at next office visit.  2. Essential hypertension Reece is working on healthy weight loss and exercise to improve blood pressure control. We will watch for signs of hypotension as she continues her lifestyle modifications. [name] will continue to journal.  3. At risk for constipation [name] was given approximately 15 minutes of counseling today regarding prevention of constipation. She was encouraged to increase water and fiber intake.    4. Obesity with current BMI 34.3 - [name] is currently in the action stage of change. As such, her goal is to continue with weight loss efforts. She has agreed to keeping a food journal and adhering to recommended goals of 1100-1400 calories and 80 protein.   - Liraglutide -Weight Management (SAXENDA) 18 MG/3ML SOPN; Inject 3 mg into the skin once a week. Start @ 1.2 in skin daily  Dispense: 15 mL; Refill: 0  Exercise goals: As is  Behavioral modification strategies: increasing lean protein intake, decreasing simple carbohydrates, meal planning and cooking strategies, keeping healthy foods in the home, planning for success, and keeping a strict food journal.  [name] has agreed to follow-up with our clinic in 2 weeks. She was informed of the importance of frequent follow-up visits to maximize her success with intensive lifestyle modifications for  her multiple health conditions.   Objective:   Blood pressure 128/86, pulse 89, temperature 98.6 F (37 C), height 5' (1.524 m), weight 175 lb (79.4 kg), SpO2 97 %. Body mass index is 34.18 kg/m.  General: Cooperative, alert, well developed,  in no acute distress. HEENT: Conjunctivae and lids unremarkable. Cardiovascular: Regular rhythm.  Lungs: Normal work of breathing. Neurologic: No focal deficits.   Lab Results  Component Value Date   CREATININE 0.76 01/03/2021   BUN 16 01/03/2021   NA 141 01/03/2021   K 4.7 01/03/2021   CL 105 01/03/2021   CO2 20 01/03/2021   Lab Results  Component Value Date   ALT 22 01/03/2021   AST 20 01/03/2021   ALKPHOS 62 01/03/2021   BILITOT 0.4 01/03/2021   Lab Results  Component Value Date   HGBA1C 5.7 (H) 01/03/2021   HGBA1C 5.7 (H) 06/03/2020   HGBA1C 5.7 (H) 04/03/2020   HGBA1C 5.6 09/09/2018   HGBA1C 5.9 (H) 10/22/2016   Lab Results  Component Value Date   INSULIN 12.0 04/03/2020   Lab Results  Component Value Date   TSH 3.440 01/03/2021   Lab Results  Component Value Date   CHOL 268 (H) 01/03/2021   HDL 55 01/03/2021   LDLCALC 165 (H) 01/03/2021   TRIG 256 (H) 01/03/2021   CHOLHDL 4.9 (H) 01/03/2021   Lab Results  Component Value Date   VD25OH 18.9 (L) 01/03/2021   VD25OH 29.4 (L) 06/27/2020   VD25OH 20.0 (L) 04/03/2020   Lab Results  Component Value Date   WBC 6.3 06/03/2020   HGB 14.6 06/03/2020   HCT 43.4 06/03/2020   MCV 97 06/03/2020   PLT 314 06/03/2020   No results found for: IRON, TIBC, FERRITIN  Attestation Statements:   Reviewed by clinician on day of visit: allergies, medications, problem list, medical history, surgical history, family history, social history, and previous encounter notes.  IPaulla Fore, CMA, am acting as transcriptionist for William Hamburger, NP-C  I have reviewed the above documentation for accuracy and completeness, and I agree with the above. -  [name] d. Deanna Entwistle, NP-C

## 2021-07-04 ENCOUNTER — Ambulatory Visit: Payer: BC Managed Care – PPO | Admitting: Family Medicine

## 2021-07-07 ENCOUNTER — Ambulatory Visit: Payer: BC Managed Care – PPO | Admitting: Family Medicine

## 2021-07-07 ENCOUNTER — Other Ambulatory Visit: Payer: Self-pay

## 2021-07-07 ENCOUNTER — Encounter (INDEPENDENT_AMBULATORY_CARE_PROVIDER_SITE_OTHER): Payer: Self-pay | Admitting: Adult Health

## 2021-07-07 ENCOUNTER — Ambulatory Visit (INDEPENDENT_AMBULATORY_CARE_PROVIDER_SITE_OTHER): Payer: BC Managed Care – PPO | Admitting: Adult Health

## 2021-07-07 VITALS — BP 140/81 | HR 79 | Temp 98.6°F | Ht 60.0 in | Wt 172.0 lb

## 2021-07-07 DIAGNOSIS — R7303 Prediabetes: Secondary | ICD-10-CM

## 2021-07-07 DIAGNOSIS — Z6835 Body mass index (BMI) 35.0-35.9, adult: Secondary | ICD-10-CM

## 2021-07-07 DIAGNOSIS — I10 Essential (primary) hypertension: Secondary | ICD-10-CM

## 2021-07-08 NOTE — Progress Notes (Signed)
Chief Complaint:   OBESITY [name] is here to discuss her progress with her obesity treatment plan along with follow-up of her obesity related diagnoses. [name] is on keeping a food journal and adhering to recommended goals of 1100-1400 calories and 80 grams of protein and states she is following her eating plan approximately 75% of the time. [name] states she is not exercising regularly.  Today's visit was #: 4 Starting weight: 183 lbs Starting date: 05/28/2021 Today's weight: 172 lbs Today's date: 07/07/2021 Total lbs lost to date: 11 lbs Total lbs lost since last in-office visit: 3 lbs  Interim History: [name] tracks intake 100%.  She estimates to consume 1200-1400 calories and 60-80 grams of protein/day.  She increased Saxenda to 1.2 mg daily and has been on this dose for 2 weeks.   She denies mass in neck, dysphagia, dyspepsia, or persistent hoarseness.  Subjective:   1. Essential hypertension SBP slightly elevated at office visit.  She is on Vasotec 20 mg daily.  BP Readings from Last 3 Encounters:  07/07/21 140/81  06/25/21 128/86  06/11/21 135/86   2. Pre-diabetes A1c 5.7 on 01/03/2021.  She is on Saxenda 1.2 mg daily and has been on this dose for 2 weeks.   She denies mass in neck, dysphagia, dyspepsia, or persistent hoarseness.  Lab Results  Component Value Date   HGBA1C 5.7 (H) 01/03/2021   Lab Results  Component Value Date   INSULIN 12.0 04/03/2020   Assessment/Plan:   1. Essential hypertension [name] is working on healthy weight loss and exercise to improve blood pressure control. We will watch for signs of hypotension as she continues her lifestyle modifications.  Continue daily Vasotec 20 mg.  2. Pre-diabetes [name] will continue to work on weight loss, exercise, and decreasing simple carbohydrates to help decrease the risk of diabetes.  Continue Saxenda.  Increase high protein snacks, especially when experiencing polyphagia.  3. Obesity  with current BMI 33.7  [name] is currently in the action stage of change. As such, her goal is to continue with weight loss efforts. She has agreed to keeping a food journal and adhering to recommended goals of 1100-1400 calories and 80 grams of protein.   Increase Saxenda to 1.8 mg daily.  Decrease to 1.2 mg daily if GI upset occurs.   Check fasting labs at next visit.  Exercise goals: No exercise has been prescribed at this time.  Behavioral modification strategies: increasing lean protein intake, decreasing simple carbohydrates, meal planning and cooking strategies, keeping healthy foods in the home, planning for success, and keeping a strict food journal.  [name] has agreed to follow-up with our clinic in 2 weeks, fasting. She was informed of the importance of frequent follow-up visits to maximize her success with intensive lifestyle modifications for her multiple health conditions.   Objective:   Blood pressure 140/81, pulse 79, temperature 98.6 F (37 C), height 5' (1.524 m), weight 172 lb (78 kg), SpO2 97 %. Body mass index is 33.59 kg/m.  General: Cooperative, alert, well developed, in no acute distress. HEENT: Conjunctivae and lids unremarkable. Cardiovascular: Regular rhythm.  Lungs: Normal work of breathing. Neurologic: No focal deficits.   Lab Results  Component Value Date   CREATININE 0.76 01/03/2021   BUN 16 01/03/2021   NA 141 01/03/2021   K 4.7 01/03/2021   CL 105 01/03/2021   CO2 20 01/03/2021   Lab Results  Component Value Date   ALT 22 01/03/2021   AST 20 01/03/2021  ALKPHOS 62 01/03/2021   BILITOT 0.4 01/03/2021   Lab Results  Component Value Date   HGBA1C 5.7 (H) 01/03/2021   HGBA1C 5.7 (H) 06/03/2020   HGBA1C 5.7 (H) 04/03/2020   HGBA1C 5.6 09/09/2018   HGBA1C 5.9 (H) 10/22/2016   Lab Results  Component Value Date   INSULIN 12.0 04/03/2020   Lab Results  Component Value Date   TSH 3.440 01/03/2021   Lab Results  Component Value  Date   CHOL 268 (H) 01/03/2021   HDL 55 01/03/2021   LDLCALC 165 (H) 01/03/2021   TRIG 256 (H) 01/03/2021   CHOLHDL 4.9 (H) 01/03/2021   Lab Results  Component Value Date   VD25OH 18.9 (L) 01/03/2021   VD25OH 29.4 (L) 06/27/2020   VD25OH 20.0 (L) 04/03/2020   Lab Results  Component Value Date   WBC 6.3 06/03/2020   HGB 14.6 06/03/2020   HCT 43.4 06/03/2020   MCV 97 06/03/2020   PLT 314 06/03/2020   Attestation Statements:   Reviewed by clinician on day of visit: allergies, medications, problem list, medical history, surgical history, family history, social history, and previous encounter notes.  Time spent on visit including pre-visit chart review and post-visit care and charting was 30 minutes.   I, Insurance claims handler, CMA, am acting as Energy manager for William Hamburger, NP.  I have reviewed the above documentation for accuracy and completeness, and I agree with the above. -  Finnbar Cedillos d. Voyd Groft, NP-C

## 2021-07-20 ENCOUNTER — Other Ambulatory Visit: Payer: Self-pay | Admitting: Family Medicine

## 2021-07-20 DIAGNOSIS — I10 Essential (primary) hypertension: Secondary | ICD-10-CM

## 2021-07-22 ENCOUNTER — Other Ambulatory Visit: Payer: Self-pay

## 2021-07-22 ENCOUNTER — Ambulatory Visit (INDEPENDENT_AMBULATORY_CARE_PROVIDER_SITE_OTHER): Payer: BC Managed Care – PPO | Admitting: Adult Health

## 2021-07-22 ENCOUNTER — Encounter (INDEPENDENT_AMBULATORY_CARE_PROVIDER_SITE_OTHER): Payer: Self-pay | Admitting: Adult Health

## 2021-07-22 VITALS — BP 129/83 | HR 77 | Temp 97.6°F | Ht 60.0 in | Wt 168.0 lb

## 2021-07-22 DIAGNOSIS — E559 Vitamin D deficiency, unspecified: Secondary | ICD-10-CM | POA: Diagnosis not present

## 2021-07-22 DIAGNOSIS — Z9189 Other specified personal risk factors, not elsewhere classified: Secondary | ICD-10-CM | POA: Diagnosis not present

## 2021-07-22 DIAGNOSIS — E66812 Obesity, class 2: Secondary | ICD-10-CM

## 2021-07-22 DIAGNOSIS — E039 Hypothyroidism, unspecified: Secondary | ICD-10-CM | POA: Diagnosis not present

## 2021-07-22 DIAGNOSIS — E782 Mixed hyperlipidemia: Secondary | ICD-10-CM | POA: Diagnosis not present

## 2021-07-22 DIAGNOSIS — Z6835 Body mass index (BMI) 35.0-35.9, adult: Secondary | ICD-10-CM

## 2021-07-22 DIAGNOSIS — R7303 Prediabetes: Secondary | ICD-10-CM

## 2021-07-22 NOTE — Progress Notes (Addendum)
Chief Complaint:   OBESITY [name] is here to discuss her progress with her obesity treatment plan along with follow-up of her obesity related diagnoses. [name] is on keeping a food journal and adhering to recommended goals of 1100-1400 calories and 80 grams of protein and states she is following her eating plan approximately 85% of the time. [name] states she is not exercising regularly at this time.  Today's visit was #: 5 Starting weight: 183 lbs Starting date: 05/28/2021 Today's weight: 168 lbs Today's date: 07/22/2021 Total lbs lost to date: 15 lbs Total lbs lost since last in-office visit: 4 lbs  Interim History: Reviewed body composition in detail with [name]. The [employer] fall semester has started, which has increased stress and triggered stress eating. She is on Saxenda 1.2 mg QD,1.8 mg QD caused abdominal pain and vomiting.  Subjective:   1. Vitamin D deficiency On 01/03/2021, vitamin D level was 18.9 - well below goal of 50.  She is currently taking prescription ergocalciferol 50,000 IU each week. She denies nausea, vomiting or muscle weakness.  Lab Results  Component Value Date   VD25OH 18.9 (L) 01/03/2021   VD25OH 29.4 (L) 06/27/2020   VD25OH 20.0 (L) 04/03/2020   2. Pre-diabetes On 01/03/2021, A1c was 5.7. She is on Saxenda for weight loss.   Lab Results  Component Value Date   HGBA1C 5.7 (H) 01/03/2021   Lab Results  Component Value Date   INSULIN 12.0 04/03/2020   3. Hypothyroidism, unspecified type She denies increase in fatigue levels. PCP manages levothyroxine 100 mcg.  Lab Results  Component Value Date   TSH 3.440 01/03/2021   4. Mixed hyperlipidemia Family history of hyperlipidemia - father, mother, brothers. Statin intolerant - myalgias, memory decline.  Lab Results  Component Value Date   ALT 22 01/03/2021   AST 20 01/03/2021   ALKPHOS 62 01/03/2021   BILITOT 0.4 01/03/2021   Lab Results  Component Value Date   CHOL 268 (H)  01/03/2021   HDL 55 01/03/2021   LDLCALC 165 (H) 01/03/2021   TRIG 256 (H) 01/03/2021   CHOLHDL 4.9 (H) 01/03/2021   5. At risk for nausea [name] is at risk for nausea with higher doses of GLP-1.  Assessment/Plan:   1. Vitamin D deficiency Check labs today.  - VITAMIN D 25 Hydroxy (Vit-D Deficiency, Fractures)  2. Pre-diabetes Check labs today.  [name] will continue to work on weight loss, exercise, and decreasing simple carbohydrates to help decrease the risk of diabetes.   - Comprehensive metabolic panel - Hemoglobin A1c  3. Hypothyroidism, unspecified type Check labs - forward to PCP.  Patient with long-standing hypothyroidism, on levothyroxine therapy. She appears euthyroid. Orders and follow up as documented in patient record.  Counseling Good thyroid control is important for overall health. Supratherapeutic thyroid levels are dangerous and will not improve weight loss results. The correct way to take levothyroxine is fasting, with water, separated by at least 30 minutes from breakfast, and separated by more than 4 hours from calcium, iron, multivitamins, acid reflux medications (PPIs).    - TSH + free T4  4. Mixed hyperlipidemia Follow-up with PCP.  Cardiovascular risk and specific lipid/LDL goals reviewed.  We discussed several lifestyle modifications today and Nadean will continue to work on diet, exercise and weight loss efforts. Orders and follow up as documented in patient record.   Counseling Intensive lifestyle modifications are the first line treatment for this issue. Dietary changes: Increase soluble fiber. Decrease simple carbohydrates. Exercise  changes: Moderate to vigorous-intensity aerobic activity 150 minutes per week if tolerated. Lipid-lowering medications: see documented in medical record.  5. At risk for nausea [name] was given approximately 15 minutes of nausea prevention counseling today. [name] is at risk for nausea due to her new  or current medication. She was encouraged to titrate her medication slowly, make sure to stay hydrated, eat smaller portions throughout the day, and avoid high fat meals.    6. Obesity with current BMI 32.8  [name] is currently in the action stage of change. As such, her goal is to continue with weight loss efforts. She has agreed to keeping a food journal and adhering to recommended goals of 1100-1400 calories and 80 grams of protein.   Exercise goals: Older adults should follow the adult guidelines. When older adults cannot meet the adult guidelines, they should be as physically active as their abilities and conditions will allow.  Older adults should do exercises that maintain or improve balance if they are at risk of falling.   Behavioral modification strategies: increasing lean protein intake, decreasing simple carbohydrates, meal planning and cooking strategies, keeping healthy foods in the home, emotional eating strategies, planning for success, and keeping a strict food journal.  Continue Victoza 1.2 mg daily.   [name] has agreed to follow-up with our clinic in 2 weeks. She was informed of the importance of frequent follow-up visits to maximize her success with intensive lifestyle modifications for her multiple health conditions.   [name] was informed we would discuss her lab results at her next visit unless there is a critical issue that needs to be addressed sooner. [name] agreed to keep her next visit at the agreed upon time to discuss these results.  Objective:   Blood pressure 129/83, pulse 77, temperature 97.6 F (36.4 C), height 5' (1.524 m), weight 168 lb (76.2 kg), SpO2 97 %. Body mass index is 32.81 kg/m.  General: Cooperative, alert, well developed, in no acute distress. HEENT: Conjunctivae and lids unremarkable. Cardiovascular: Regular rhythm.  Lungs: Normal work of breathing. Neurologic: No focal deficits.   Lab Results  Component Value Date   CREATININE  0.76 01/03/2021   BUN 16 01/03/2021   NA 141 01/03/2021   K 4.7 01/03/2021   CL 105 01/03/2021   CO2 20 01/03/2021   Lab Results  Component Value Date   ALT 22 01/03/2021   AST 20 01/03/2021   ALKPHOS 62 01/03/2021   BILITOT 0.4 01/03/2021   Lab Results  Component Value Date   HGBA1C 5.7 (H) 01/03/2021   HGBA1C 5.7 (H) 06/03/2020   HGBA1C 5.7 (H) 04/03/2020   HGBA1C 5.6 09/09/2018   HGBA1C 5.9 (H) 10/22/2016   Lab Results  Component Value Date   INSULIN 12.0 04/03/2020   Lab Results  Component Value Date   TSH 3.440 01/03/2021   Lab Results  Component Value Date   CHOL 268 (H) 01/03/2021   HDL 55 01/03/2021   LDLCALC 165 (H) 01/03/2021   TRIG 256 (H) 01/03/2021   CHOLHDL 4.9 (H) 01/03/2021   Lab Results  Component Value Date   VD25OH 18.9 (L) 01/03/2021   VD25OH 29.4 (L) 06/27/2020   VD25OH 20.0 (L) 04/03/2020   Lab Results  Component Value Date   WBC 6.3 06/03/2020   HGB 14.6 06/03/2020   HCT 43.4 06/03/2020   MCV 97 06/03/2020   PLT 314 06/03/2020   Attestation Statements:   Reviewed by clinician on day of visit: allergies, medications, problem list,  medical history, surgical history, family history, social history, and previous encounter notes.  I, Insurance claims handler, CMA, am acting as Energy manager for William Hamburger, NP.  I have reviewed the above documentation for accuracy and completeness, and I agree with the above. -  Denitra Donaghey d. Flower Franko, NP-C

## 2021-07-23 ENCOUNTER — Other Ambulatory Visit (INDEPENDENT_AMBULATORY_CARE_PROVIDER_SITE_OTHER): Payer: Self-pay | Admitting: Adult Health

## 2021-07-23 LAB — COMPREHENSIVE METABOLIC PANEL
ALT: 30 IU/L (ref 0–32)
AST: 25 IU/L (ref 0–40)
Albumin/Globulin Ratio: 2.4 — ABNORMAL HIGH (ref 1.2–2.2)
Albumin: 4.8 g/dL (ref 3.8–4.8)
Alkaline Phosphatase: 59 IU/L (ref 44–121)
BUN/Creatinine Ratio: 25 (ref 12–28)
BUN: 19 mg/dL (ref 8–27)
Bilirubin Total: 0.5 mg/dL (ref 0.0–1.2)
CO2: 22 mmol/L (ref 20–29)
Calcium: 9.7 mg/dL (ref 8.7–10.3)
Chloride: 103 mmol/L (ref 96–106)
Creatinine, Ser: 0.76 mg/dL (ref 0.57–1.00)
Globulin, Total: 2 g/dL (ref 1.5–4.5)
Glucose: 94 mg/dL (ref 65–99)
Potassium: 4.5 mmol/L (ref 3.5–5.2)
Sodium: 140 mmol/L (ref 134–144)
Total Protein: 6.8 g/dL (ref 6.0–8.5)
eGFR: 89 mL/min/{1.73_m2} (ref >59)

## 2021-07-23 LAB — TSH+FREE T4
Free T4: 1.81 ng/dL — ABNORMAL HIGH (ref 0.82–1.77)
TSH: 1.01 u[IU]/mL (ref 0.450–4.500)

## 2021-07-23 LAB — HEMOGLOBIN A1C
Est. average glucose Bld gHb Est-mCnc: 114 mg/dL
Hgb A1c MFr Bld: 5.6 % (ref 4.8–5.6)

## 2021-07-23 LAB — VITAMIN D 25 HYDROXY (VIT D DEFICIENCY, FRACTURES): Vit D, 25-Hydroxy: 57 ng/mL (ref 30.0–100.0)

## 2021-07-25 ENCOUNTER — Encounter: Payer: Self-pay | Admitting: Family Medicine

## 2021-08-04 ENCOUNTER — Encounter (INDEPENDENT_AMBULATORY_CARE_PROVIDER_SITE_OTHER): Payer: Self-pay | Admitting: Adult Health

## 2021-08-04 ENCOUNTER — Ambulatory Visit (INDEPENDENT_AMBULATORY_CARE_PROVIDER_SITE_OTHER): Payer: BC Managed Care – PPO | Admitting: Adult Health

## 2021-08-04 ENCOUNTER — Other Ambulatory Visit: Payer: Self-pay

## 2021-08-04 VITALS — BP 143/82 | HR 83 | Temp 98.3°F | Ht 60.0 in | Wt 166.0 lb

## 2021-08-04 DIAGNOSIS — Z6835 Body mass index (BMI) 35.0-35.9, adult: Secondary | ICD-10-CM

## 2021-08-04 DIAGNOSIS — E559 Vitamin D deficiency, unspecified: Secondary | ICD-10-CM | POA: Diagnosis not present

## 2021-08-04 DIAGNOSIS — Z9189 Other specified personal risk factors, not elsewhere classified: Secondary | ICD-10-CM

## 2021-08-04 DIAGNOSIS — E039 Hypothyroidism, unspecified: Secondary | ICD-10-CM

## 2021-08-04 DIAGNOSIS — R7303 Prediabetes: Secondary | ICD-10-CM

## 2021-08-04 MED ORDER — SAXENDA 18 MG/3ML ~~LOC~~ SOPN: PEN_INJECTOR | SUBCUTANEOUS | 0 refills | Status: DC

## 2021-08-04 MED ORDER — BD PEN NEEDLE NANO U/F 32G X 4 MM MISC: 0 refills | Status: DC

## 2021-08-04 NOTE — Progress Notes (Signed)
Chief Complaint:   OBESITY [name] is here to discuss her progress with her obesity treatment plan along with follow-up of her obesity related diagnoses. [name] is on keeping a food journal and adhering to recommended goals of 1100-1400 calories and 80 grams of protein and states she is following her eating plan approximately 90% of the time. [name] states she is not exercising regularly.  Today's visit was #: 6 Starting weight: 183 lbs Starting date: 05/28/2021 Today's weight: 166 lbs Today's date: 08/04/2021 Total lbs lost to date: 17 lbs Total lbs lost since last in-office visit: 2 lbs  Interim History: [name] was rushing around this morning - BP slightly elevated at office visit.   She is on Saxenda 1.2 mg once daily. She denies mass in neck, dysphagia, dyspepsia, persistent hoarseness, nausea, or constipation. She reports being hungry often in late afternoon.  Subjective:   1. Vitamin D deficiency On 07/22/2021, vitamin D level - 57.0 - at goal.   She is on a multivitamin and ergocalciferol.  She denies nausea, vomiting or muscle weakness.  Lab Results  Component Value Date   VD25OH 57.0 07/22/2021   VD25OH 18.9 (L) 01/03/2021   VD25OH 29.4 (L) 06/27/2020   2. Pre-diabetes On 07/22/2021, BG 94, A1c 5.6.  A1c down from 5.7 at last check. She is on Saxenda. She is on Saxenda 1.2 mg once daily. She denies mass in neck, dysphagia, dyspepsia, persistent hoarseness, nausea, or constipation. She reports being hungry often in late afternoon.  Lab Results  Component Value Date   HGBA1C 5.6 07/22/2021   Lab Results  Component Value Date   INSULIN 12.0 04/03/2020   3. Hypothyroidism, unspecified type On 07/22/2021, TSH 1.010, free T4 1.81. She denies excessive fatigue, palpitations, hair loss.  She is on levothyroxine 100 mcg - per patient - she is taking correctly.   Lab Results  Component Value Date   TSH 1.010 07/22/2021   4. At risk for nausea [name] is at  risk for nausea due to increasing GLP for obesity.  Assessment/Plan:   1. Vitamin D deficiency Discussed labs with patient today.  Complete ergocalciferol, then convert to OTC vitamin D3 2,000 IU daily.  2. Pre-diabetes Discussed labs with patient today.  Continue Saxenda and Category 3 meal plan.  3. Hypothyroidism, unspecified type Discussed labs with patient today.  Follow-up to recheck thyroid panel in 6 weeks.  4. At risk for nausea [name] was given approximately 15 minutes of nausea prevention counseling today. [name] is at risk for nausea due to her new or current medication. She was encouraged to titrate her medication slowly, make sure to stay hydrated, eat smaller portions throughout the day, and avoid high fat meals.    5. Obesity with current BMI 32.5  - Increase and refill Liraglutide -Weight Management (SAXENDA) 18 MG/3ML SOPN; Inject 1.8mg  once day. Decrease to 1.2mg  once a day  if GI upset develops  Dispense: 15 mL; Refill: 0 - Refill Insulin Pen Needle (BD PEN NEEDLE NANO U/F) 32G X 4 MM MISC; Use Nano needle with Ozempic  Dispense: 100 each; Refill: 0  [name] is currently in the action stage of change. As such, her goal is to continue with weight loss efforts. She has agreed to keeping a food journal and adhering to recommended goals of 1100-1400 calories and 80 grams of protein.   Exercise goals: All adults should avoid inactivity. Some physical activity is better than none, and adults who participate in any  amount of physical activity gain some health benefits.  Behavioral modification strategies: increasing lean protein intake, decreasing simple carbohydrates, meal planning and cooking strategies, keeping healthy foods in the home, planning for success, and keeping a strict food journal.  Increase and refill Saxenda 1.8 mg daily  today.  Decrease to 1.2 mg if GI upset occurs.  Also refill pen needles today.  [name] has agreed to follow-up with our  clinic in 2 weeks. She was informed of the importance of frequent follow-up visits to maximize her success with intensive lifestyle modifications for her multiple health conditions.   Objective:   Blood pressure (!) 143/82, pulse 83, temperature 98.3 F (36.8 C), height 5' (1.524 m), weight 166 lb (75.3 kg), SpO2 98 %. Body mass index is 32.42 kg/m.  General: Cooperative, alert, well developed, in no acute distress. HEENT: Conjunctivae and lids unremarkable. Cardiovascular: Regular rhythm.  Lungs: Normal work of breathing. Neurologic: No focal deficits.   Lab Results  Component Value Date   CREATININE 0.76 07/22/2021   BUN 19 07/22/2021   NA 140 07/22/2021   K 4.5 07/22/2021   CL 103 07/22/2021   CO2 22 07/22/2021   Lab Results  Component Value Date   ALT 30 07/22/2021   AST 25 07/22/2021   ALKPHOS 59 07/22/2021   BILITOT 0.5 07/22/2021   Lab Results  Component Value Date   HGBA1C 5.6 07/22/2021   HGBA1C 5.7 (H) 01/03/2021   HGBA1C 5.7 (H) 06/03/2020   HGBA1C 5.7 (H) 04/03/2020   HGBA1C 5.6 09/09/2018   Lab Results  Component Value Date   INSULIN 12.0 04/03/2020   Lab Results  Component Value Date   TSH 1.010 07/22/2021   Lab Results  Component Value Date   CHOL 268 (H) 01/03/2021   HDL 55 01/03/2021   LDLCALC 165 (H) 01/03/2021   TRIG 256 (H) 01/03/2021   CHOLHDL 4.9 (H) 01/03/2021   Lab Results  Component Value Date   VD25OH 57.0 07/22/2021   VD25OH 18.9 (L) 01/03/2021   VD25OH 29.4 (L) 06/27/2020   Lab Results  Component Value Date   WBC 6.3 06/03/2020   HGB 14.6 06/03/2020   HCT 43.4 06/03/2020   MCV 97 06/03/2020   PLT 314 06/03/2020   Attestation Statements:   Reviewed by clinician on day of visit: allergies, medications, problem list, medical history, surgical history, family history, social history, and previous encounter notes.  I, Insurance claims handler, CMA, am acting as Energy manager for William Hamburger, NP.  I have reviewed the above  documentation for accuracy and completeness, and I agree with the above. Orpha Bur d Luster Hechler, NP-C

## 2021-08-19 ENCOUNTER — Other Ambulatory Visit: Payer: Self-pay

## 2021-08-19 ENCOUNTER — Ambulatory Visit (INDEPENDENT_AMBULATORY_CARE_PROVIDER_SITE_OTHER): Payer: BC Managed Care – PPO | Admitting: Adult Health

## 2021-08-19 ENCOUNTER — Encounter (INDEPENDENT_AMBULATORY_CARE_PROVIDER_SITE_OTHER): Payer: Self-pay | Admitting: Adult Health

## 2021-08-19 VITALS — BP 117/80 | HR 79 | Temp 98.2°F | Ht 60.0 in | Wt 163.0 lb

## 2021-08-19 DIAGNOSIS — Z6835 Body mass index (BMI) 35.0-35.9, adult: Secondary | ICD-10-CM

## 2021-08-19 DIAGNOSIS — E782 Mixed hyperlipidemia: Secondary | ICD-10-CM

## 2021-08-19 DIAGNOSIS — E039 Hypothyroidism, unspecified: Secondary | ICD-10-CM | POA: Diagnosis not present

## 2021-08-19 NOTE — Progress Notes (Addendum)
Chief Complaint:   OBESITY [name] is here to discuss her progress with her obesity treatment plan along with follow-up of her obesity related diagnoses. [name] is on keeping a food journal and adhering to recommended goals of 1100-1400 calories and 80 grams of protein and states she is following her eating plan approximately 70% of the time. [name] states she is walking for 15 minutes 3-4 times per week.  Today's visit was #: 7 Starting weight: 183 lbs Starting date: 05/28/2021 Today's weight: 163 lbs Today's date: 08/19/2021 Total lbs lost to date: 20 lbs Total lbs lost since last in-office visit: 3 lbs  Interim History: [name] estimates to track/hit goals both 70% of the time. She utilized Research scientist (life sciences) when enjoying a meal out - [place]. She is on Saxenda 1.8 mg daily.   She denies mass in neck, dysphagia, dyspepsia, persistent hoarseness, abd pain, or GI upset.  Subjective:   1. Hypothyroidism, unspecified type On 07/22/2021, TSH-1.010 - WNL Free T4- 1.81- just slightly above goal.  She denies excessive fatigue, palpitations, hair loss.  2. Mixed hyperlipidemia Family history of HLD - father, siblings. She was previously on atorvastatin - significant myalgias.   She has never been trialed on rosuvastatin.   Discussed risks/benefits of statin therapy. ASCVD 5.1%.  Assessment/Plan:   1. Hypothyroidism, unspecified type Check TSH at next office visit - forward results to PCP/Dr. Neva Seat.  2. Mixed hyperlipidemia Check labs at next office visit.  3. Obesity with current BMI 31.8  [name] is currently in the action stage of change. As such, her goal is to continue with weight loss efforts. She has agreed to keeping a food journal and adhering to recommended goals of 1100-1400 calories and 80 grams of protein.   Check TSH at next office visit.  Check lipid/CMP panel.  Continue Saxenda 1.8 mg.  Exercise goals:  As is.  Behavioral modification strategies: increasing  lean protein intake, decreasing simple carbohydrates, meal planning and cooking strategies, keeping healthy foods in the home, and planning for success.  [name] has agreed to follow-up with our clinic in 2-3 weeks, fasting. She was informed of the importance of frequent follow-up visits to maximize her success with intensive lifestyle modifications for her multiple health conditions.   Objective:   Blood pressure 117/80, pulse 79, temperature 98.2 F (36.8 C), height 5' (1.524 m), weight 163 lb (73.9 kg), SpO2 98 %. Body mass index is 31.83 kg/m.  General: Cooperative, alert, well developed, in no acute distress. HEENT: Conjunctivae and lids unremarkable. Cardiovascular: Regular rhythm.  Lungs: Normal work of breathing. Neurologic: No focal deficits.   Lab Results  Component Value Date   CREATININE 0.76 07/22/2021   BUN 19 07/22/2021   NA 140 07/22/2021   K 4.5 07/22/2021   CL 103 07/22/2021   CO2 22 07/22/2021   Lab Results  Component Value Date   ALT 30 07/22/2021   AST 25 07/22/2021   ALKPHOS 59 07/22/2021   BILITOT 0.5 07/22/2021   Lab Results  Component Value Date   HGBA1C 5.6 07/22/2021   HGBA1C 5.7 (H) 01/03/2021   HGBA1C 5.7 (H) 06/03/2020   HGBA1C 5.7 (H) 04/03/2020   HGBA1C 5.6 09/09/2018   Lab Results  Component Value Date   INSULIN 12.0 04/03/2020   Lab Results  Component Value Date   TSH 1.010 07/22/2021   Lab Results  Component Value Date   CHOL 268 (H) 01/03/2021   HDL 55 01/03/2021   LDLCALC 165 (H) 01/03/2021  TRIG 256 (H) 01/03/2021   CHOLHDL 4.9 (H) 01/03/2021   Lab Results  Component Value Date   VD25OH 57.0 07/22/2021   VD25OH 18.9 (L) 01/03/2021   VD25OH 29.4 (L) 06/27/2020   Lab Results  Component Value Date   WBC 6.3 06/03/2020   HGB 14.6 06/03/2020   HCT 43.4 06/03/2020   MCV 97 06/03/2020   PLT 314 06/03/2020   Attestation Statements:   Reviewed by clinician on day of visit: allergies, medications, problem list,  medical history, surgical history, family history, social history, and previous encounter notes.  Time spent on visit including pre-visit chart review and post-visit care and charting was 28 minutes.   I, Insurance claims handler, CMA, am acting as Energy manager for William Hamburger, NP.  I have reviewed the above documentation for accuracy and completeness, and I agree with the above. - Teala Daffron d. Rustyn Conery, NP-C

## 2021-09-02 ENCOUNTER — Ambulatory Visit (INDEPENDENT_AMBULATORY_CARE_PROVIDER_SITE_OTHER): Payer: BC Managed Care – PPO | Admitting: Adult Health

## 2021-09-02 ENCOUNTER — Other Ambulatory Visit: Payer: Self-pay

## 2021-09-02 ENCOUNTER — Encounter (INDEPENDENT_AMBULATORY_CARE_PROVIDER_SITE_OTHER): Payer: Self-pay | Admitting: Adult Health

## 2021-09-02 VITALS — BP 134/77 | HR 74 | Temp 98.2°F | Ht 60.0 in | Wt 163.0 lb

## 2021-09-02 DIAGNOSIS — Z6835 Body mass index (BMI) 35.0-35.9, adult: Secondary | ICD-10-CM

## 2021-09-02 DIAGNOSIS — E039 Hypothyroidism, unspecified: Secondary | ICD-10-CM | POA: Diagnosis not present

## 2021-09-02 DIAGNOSIS — R7303 Prediabetes: Secondary | ICD-10-CM | POA: Diagnosis not present

## 2021-09-02 DIAGNOSIS — E782 Mixed hyperlipidemia: Secondary | ICD-10-CM

## 2021-09-02 DIAGNOSIS — E66812 Obesity, class 2: Secondary | ICD-10-CM

## 2021-09-02 DIAGNOSIS — Z9189 Other specified personal risk factors, not elsewhere classified: Secondary | ICD-10-CM

## 2021-09-02 DIAGNOSIS — E559 Vitamin D deficiency, unspecified: Secondary | ICD-10-CM

## 2021-09-02 NOTE — Progress Notes (Signed)
Chief Complaint:   OBESITY [name] is here to discuss her progress with her obesity treatment plan along with follow-up of her obesity related diagnoses. [name] is on keeping a food journal and adhering to recommended goals of 1100-1400 calories and 80 grams protein and states she is following her eating plan approximately 70% of the time. [name] states she is walking 15 minutes 2 times per week.  Today's visit was #: 8 Starting weight: 183 lbs Starting date: 05/28/2021 Today's weight: 163 lbs Today's date: 09/02/2021 Total lbs lost to date: 20 Total lbs lost since last in-office visit: 0  Interim History: [name] is pleased to have maintained weight. She was able to increase Saxenda to 1.8 mg QD.  She denies mass in neck, dysphagia, dyspepsia, or persistent hoarseness.  Subjective:   1. Pre-diabetes 07/22/2021 A1c 5.6, which is improved from 5.7 on 01/03/2021.  2. Vitamin D deficiency 07/22/2021 Vit D level was 57.0.  3. Mixed hyperlipidemia 01/03/2021 Lipid panel- all levels above goal with normal HDL of 55. Pt's ASCVD risk stratification 6.6%- elevation when on atorvastatin- Pt unable to tolerate statin due to fatigue and brain fog.  Her father had HLD and 3 out 4 brothers all with HLD.  4. Hypothyroidism, unspecified type On 07/22/2021, TSH 1.010, free T4 1.81. She denies excessive fatigue, palpitations, hair loss.  She is on levothyroxine 100 mcg - per patient - she is taking correctly.   5. At risk for heart disease [name] is at a higher than average risk for cardiovascular disease due to obesity, hypertension, and hyperlipidemia.   Assessment/Plan:   1. Pre-diabetes [name] will continue to work on weight loss, exercise, and decreasing simple carbohydrates to help decrease the risk of diabetes. Continue to increase protein.  2. Vitamin D deficiency [name] will complete Ergocalciferol, then convert to OTC Vit D3 2,000 IU QD.  3. Mixed  hyperlipidemia Cardiovascular risk and specific lipid/LDL goals reviewed.  We discussed several lifestyle modifications today and Velvet will continue to work on diet, exercise and weight loss efforts. Orders and follow up as documented in patient record.   Counseling Intensive lifestyle modifications are the first line treatment for this issue. Dietary changes: Increase soluble fiber. Decrease simple carbohydrates. Exercise changes: Moderate to vigorous-intensity aerobic activity 150 minutes per week if tolerated. Lipid-lowering medications: see documented in medical record. Check labs today.  - Comprehensive metabolic panel - Lipid panel  4. Hypothyroidism, unspecified type Patient with long-standing hypothyroidism, on levothyroxine therapy. She appears euthyroid. Orders and follow up as documented in patient record.  Counseling Good thyroid control is important for overall health. Supratherapeutic thyroid levels are dangerous and will not improve weight loss results. The correct way to take levothyroxine is fasting, with water, separated by at least 30 minutes from breakfast, and separated by more than 4 hours from calcium, iron, multivitamins, acid reflux medications (PPIs).   Check labs today, then forward results to PCP, Dr. Neva Seat.  - TSH + free T4  5. At risk for heart disease [name] was given approximately 15 minutes of coronary artery disease prevention counseling today. She is 60 y.o. female and has risk factors for heart disease including obesity. We discussed intensive lifestyle modifications today with an emphasis on specific weight loss instructions and strategies.   Repetitive spaced learning was employed today to elicit superior memory formation and behavioral change.   6. Obesity with current BMI 32.0  [name] is currently in the action stage of change. As such, her goal is  to continue with weight loss efforts. She has agreed to keeping a food journal and  adhering to recommended goals of 1100-1400 calories and 80 grams protein.   Continue Saxenda 1.8 mg QD.  Exercise goals:  As is  Behavioral modification strategies: increasing lean protein intake, decreasing simple carbohydrates, meal planning and cooking strategies, keeping healthy foods in the home, and planning for success.  [name] has agreed to follow-up with our clinic in 2 weeks. She was informed of the importance of frequent follow-up visits to maximize her success with intensive lifestyle modifications for her multiple health conditions.   [name] was informed we would discuss her lab results at her next visit unless there is a critical issue that needs to be addressed sooner. [name] agreed to keep her next visit at the agreed upon time to discuss these results.  Objective:   Blood pressure 134/77, pulse 74, temperature 98.2 F (36.8 C), height 5' (1.524 m), weight 163 lb (73.9 kg), SpO2 98 %. Body mass index is 31.83 kg/m.  General: Cooperative, alert, well developed, in no acute distress. HEENT: Conjunctivae and lids unremarkable. Cardiovascular: Regular rhythm.  Lungs: Normal work of breathing. Neurologic: No focal deficits.   Lab Results  Component Value Date   CREATININE 0.76 07/22/2021   BUN 19 07/22/2021   NA 140 07/22/2021   K 4.5 07/22/2021   CL 103 07/22/2021   CO2 22 07/22/2021   Lab Results  Component Value Date   ALT 30 07/22/2021   AST 25 07/22/2021   ALKPHOS 59 07/22/2021   BILITOT 0.5 07/22/2021   Lab Results  Component Value Date   HGBA1C 5.6 07/22/2021   HGBA1C 5.7 (H) 01/03/2021   HGBA1C 5.7 (H) 06/03/2020   HGBA1C 5.7 (H) 04/03/2020   HGBA1C 5.6 09/09/2018   Lab Results  Component Value Date   INSULIN 12.0 04/03/2020   Lab Results  Component Value Date   TSH 1.010 07/22/2021   Lab Results  Component Value Date   CHOL 268 (H) 01/03/2021   HDL 55 01/03/2021   LDLCALC 165 (H) 01/03/2021   TRIG 256 (H) 01/03/2021   CHOLHDL  4.9 (H) 01/03/2021   Lab Results  Component Value Date   VD25OH 57.0 07/22/2021   VD25OH 18.9 (L) 01/03/2021   VD25OH 29.4 (L) 06/27/2020   Lab Results  Component Value Date   WBC 6.3 06/03/2020   HGB 14.6 06/03/2020   HCT 43.4 06/03/2020   MCV 97 06/03/2020   PLT 314 06/03/2020    Attestation Statements:   Reviewed by clinician on day of visit: allergies, medications, problem list, medical history, surgical history, family history, social history, and previous encounter notes.  Edmund Hilda, CMA, am acting as transcriptionist for William Hamburger, NP.  I have reviewed the above documentation for accuracy and completeness, and I agree with the above. -  Jasson Siegmann d. Alivea Gladson, NP-C

## 2021-09-03 LAB — COMPREHENSIVE METABOLIC PANEL
ALT: 30 IU/L (ref 0–32)
AST: 18 IU/L (ref 0–40)
Albumin/Globulin Ratio: 2.3 — ABNORMAL HIGH (ref 1.2–2.2)
Albumin: 4.8 g/dL (ref 3.8–4.8)
Alkaline Phosphatase: 62 IU/L (ref 44–121)
BUN/Creatinine Ratio: 29 — ABNORMAL HIGH (ref 12–28)
BUN: 22 mg/dL (ref 8–27)
Bilirubin Total: 0.4 mg/dL (ref 0.0–1.2)
CO2: 21 mmol/L (ref 20–29)
Calcium: 9.7 mg/dL (ref 8.7–10.3)
Chloride: 106 mmol/L (ref 96–106)
Creatinine, Ser: 0.76 mg/dL (ref 0.57–1.00)
Globulin, Total: 2.1 g/dL (ref 1.5–4.5)
Glucose: 91 mg/dL (ref 70–99)
Potassium: 4.5 mmol/L (ref 3.5–5.2)
Sodium: 143 mmol/L (ref 134–144)
Total Protein: 6.9 g/dL (ref 6.0–8.5)
eGFR: 89 mL/min/{1.73_m2} (ref >59)

## 2021-09-03 LAB — TSH+FREE T4
Free T4: 1.63 ng/dL (ref 0.82–1.77)
TSH: 1.25 u[IU]/mL (ref 0.450–4.500)

## 2021-09-03 LAB — LIPID PANEL
Chol/HDL Ratio: 4.9 ratio — ABNORMAL HIGH (ref 0.0–4.4)
Cholesterol, Total: 234 mg/dL — ABNORMAL HIGH (ref 100–199)
HDL: 48 mg/dL (ref >39)
LDL Chol Calc (NIH): 165 mg/dL — ABNORMAL HIGH (ref 0–99)
Triglycerides: 118 mg/dL (ref 0–149)
VLDL Cholesterol Cal: 21 mg/dL (ref 5–40)

## 2021-09-03 NOTE — Progress Notes (Signed)
Labs have been faxed.

## 2021-09-16 ENCOUNTER — Encounter (INDEPENDENT_AMBULATORY_CARE_PROVIDER_SITE_OTHER): Payer: Self-pay | Admitting: Adult Health

## 2021-09-16 ENCOUNTER — Other Ambulatory Visit: Payer: Self-pay

## 2021-09-16 ENCOUNTER — Ambulatory Visit (INDEPENDENT_AMBULATORY_CARE_PROVIDER_SITE_OTHER): Payer: BC Managed Care – PPO | Admitting: Adult Health

## 2021-09-16 VITALS — BP 125/79 | HR 86 | Temp 97.7°F | Ht 60.0 in | Wt 161.0 lb

## 2021-09-16 DIAGNOSIS — I10 Essential (primary) hypertension: Secondary | ICD-10-CM | POA: Diagnosis not present

## 2021-09-16 DIAGNOSIS — E039 Hypothyroidism, unspecified: Secondary | ICD-10-CM

## 2021-09-16 DIAGNOSIS — E782 Mixed hyperlipidemia: Secondary | ICD-10-CM | POA: Diagnosis not present

## 2021-09-16 DIAGNOSIS — Z9189 Other specified personal risk factors, not elsewhere classified: Secondary | ICD-10-CM | POA: Diagnosis not present

## 2021-09-16 DIAGNOSIS — Z6835 Body mass index (BMI) 35.0-35.9, adult: Secondary | ICD-10-CM

## 2021-09-16 MED ORDER — SAXENDA 18 MG/3ML ~~LOC~~ SOPN: 2.4000 mg | PEN_INJECTOR | Freq: Every day | SUBCUTANEOUS | 0 refills | Status: DC

## 2021-09-16 NOTE — Progress Notes (Signed)
Chief Complaint:   OBESITY [name] is here to discuss her progress with her obesity treatment plan along with follow-up of her obesity related diagnoses. [name] is on keeping a food journal and adhering to recommended goals of 1100-1400 calories and 80 grams of protein and states she is following her eating plan approximately 60% of the time. [name] states she is not exercising regularly at this time.  Today's visit was #: 9 Starting weight: 183 lbs Starting date: 05/28/2021 Today's weight: 161 lbs Today's date: 09/16/2021 Total lbs lost to date: 22 lbs Total lbs lost since last in-office visit: 2 lbs  Interim History: [name]'s mother was hospitalized after a fall - no fracture. She slept at the hospital and was out of work last week, while mother in hospital and unable to follow prescribed meal plan. Saxenda 1.8 mg daily - 4 weeks at this dose, increased hunger recently. She denies mass in neck, dysphagia, dyspepsia, or persistent hoarseness.  Subjective:   1. Mixed hyperlipidemia Discussed labs with patient today.  Father/mother/brothers with hyperlipidemia. ASCVD risk stratification - 5.7% - elevated. On 09/02/2021, lipid panel - lower total, lower HDL, same LDL.  2. Hypothyroidism, unspecified type Discussed labs with patient today.  On 09/02/2021, TSH/T4 - both within normal limits.  3. Essential hypertension Discussed labs with patient today.  On 09/02/2021 - CMP - stable.  4. At risk for nausea [name] is at risk for nausea due to increasing GLP-1 for breakthrough hunger.  Assessment/Plan:   1. Mixed hyperlipidemia Follow-up with PCP - lipid therapy.  2. Hypothyroidism, unspecified type Continue levothyroxine 100 mcg daily.  3. Essential hypertension Continue daily Vasotec 20 mg daily.  4. At risk for nausea [name] was given approximately 15 minutes of nausea prevention counseling today. [name] is at risk for nausea due to her new or  current medication. She was encouraged to titrate her medication slowly, make sure to stay hydrated, eat smaller portions throughout the day, and avoid high fat meals.   5. Obesity with current BMI 32.5  - Increase and refill Liraglutide -Weight Management (SAXENDA) 18 MG/3ML SOPN; Inject 2.4 mg into the skin daily. Inject 1.8mg  once day. Decrease to 1.2mg  once a day  if GI upset develops  Dispense: 15 mL; Refill: 0  [name] is currently in the action stage of change. As such, her goal is to continue with weight loss efforts. She has agreed to keeping a food journal and adhering to recommended goals of 1100-1400 calories and 80 grams of protein.   Exercise goals: All adults should avoid inactivity. Some physical activity is better than none, and adults who participate in any amount of physical activity gain some health benefits.  Behavioral modification strategies: increasing lean protein intake, decreasing simple carbohydrates, meal planning and cooking strategies, keeping healthy foods in the home, and planning for success.  [name] has agreed to follow-up with our clinic in 2-3 weeks. She was informed of the importance of frequent follow-up visits to maximize her success with intensive lifestyle modifications for her multiple health conditions.   Objective:   Blood pressure 125/79, pulse 86, temperature 97.7 F (36.5 C), height 5' (1.524 m), weight 161 lb (73 kg), SpO2 99 %. Body mass index is 31.44 kg/m.  General: Cooperative, alert, well developed, in no acute distress. HEENT: Conjunctivae and lids unremarkable. Cardiovascular: Regular rhythm.  Lungs: Normal work of breathing. Neurologic: No focal deficits.   Lab Results  Component Value Date   CREATININE 0.76 09/02/2021  BUN 22 09/02/2021   NA 143 09/02/2021   K 4.5 09/02/2021   CL 106 09/02/2021   CO2 21 09/02/2021   Lab Results  Component Value Date   ALT 30 09/02/2021   AST 18 09/02/2021   ALKPHOS 62 09/02/2021    BILITOT 0.4 09/02/2021   Lab Results  Component Value Date   HGBA1C 5.6 07/22/2021   HGBA1C 5.7 (H) 01/03/2021   HGBA1C 5.7 (H) 06/03/2020   HGBA1C 5.7 (H) 04/03/2020   HGBA1C 5.6 09/09/2018   Lab Results  Component Value Date   INSULIN 12.0 04/03/2020   Lab Results  Component Value Date   TSH 1.250 09/02/2021   Lab Results  Component Value Date   CHOL 234 (H) 09/02/2021   HDL 48 09/02/2021   LDLCALC 165 (H) 09/02/2021   TRIG 118 09/02/2021   CHOLHDL 4.9 (H) 09/02/2021   Lab Results  Component Value Date   VD25OH 57.0 07/22/2021   VD25OH 18.9 (L) 01/03/2021   VD25OH 29.4 (L) 06/27/2020   Lab Results  Component Value Date   WBC 6.3 06/03/2020   HGB 14.6 06/03/2020   HCT 43.4 06/03/2020   MCV 97 06/03/2020   PLT 314 06/03/2020   Attestation Statements:   Reviewed by clinician on day of visit: allergies, medications, problem list, medical history, surgical history, family history, social history, and previous encounter notes.  I, Insurance claims handler, CMA, am acting as Energy manager for William Hamburger, NP.  I have reviewed the above documentation for accuracy and completeness, and I agree with the above. -  Kea Callan d. Analese Sovine, NP-C

## 2021-09-30 ENCOUNTER — Ambulatory Visit (INDEPENDENT_AMBULATORY_CARE_PROVIDER_SITE_OTHER): Payer: BC Managed Care – PPO | Admitting: Adult Health

## 2021-09-30 ENCOUNTER — Other Ambulatory Visit: Payer: Self-pay

## 2021-09-30 ENCOUNTER — Encounter (INDEPENDENT_AMBULATORY_CARE_PROVIDER_SITE_OTHER): Payer: Self-pay | Admitting: Adult Health

## 2021-09-30 VITALS — BP 148/82 | HR 64 | Temp 97.9°F | Ht 60.0 in | Wt 159.0 lb

## 2021-09-30 DIAGNOSIS — I10 Essential (primary) hypertension: Secondary | ICD-10-CM

## 2021-09-30 DIAGNOSIS — E782 Mixed hyperlipidemia: Secondary | ICD-10-CM

## 2021-09-30 DIAGNOSIS — Z6835 Body mass index (BMI) 35.0-35.9, adult: Secondary | ICD-10-CM | POA: Diagnosis not present

## 2021-09-30 NOTE — Progress Notes (Signed)
Chief Complaint:   OBESITY [name] is here to discuss her progress with her obesity treatment plan along with follow-up of her obesity related diagnoses. [name] is on keeping a food journal and adhering to recommended goals of 1100-1400 calories and 80 grams of protein and states she is following her eating plan approximately 50% of the time. [name] states she is walking for 20 minutes 3 times per week.  Today's visit was #: 10 Starting weight: 183 lbs Starting date: 05/28/2021 Today's weight: 159 lbs Today's date: 09/30/2021 Total lbs lost to date: 24 lbs Total lbs lost since last in-office visit: 2 lbs  Interim History:  [name]'s Saxenda dose was increased to 2.4 mg at last office visit due to polyphagia. She denies GI upset and reports resolution of breakthrough hunger. On average, she is consuming 1100-1200 calories and >80 grams of protein per day.  Subjective:   1. Mixed hyperlipidemia Recommend that she follow-up with her PCP for elevated cholesterol. ASCVD risk stratification 5.8%.  she intends to see her PCP in January 2023.  2. Essential hypertension BP slightly above goal.   She reports having "2 enormous cups of coffee" prior to her office visit.   She denies acute cardiac symptoms at present.  Assessment/Plan:   1. Mixed hyperlipidemia Follow-up with PCP/Dr. Neva Seat Jan 2023.  2. Essential hypertension Continue Vasotec 20 mg daily - managed by PCP/Dr. Neva Seat.  3. Obesity with current BMI 31.2  [name] is currently in the action stage of change. As such, her goal is to continue with weight loss efforts. She has agreed to keeping a food journal and adhering to recommended goals of 1100-1400 calories and 80 grams of protein.   Handout:  Thanksgiving Strategies.  Exercise goals:  As is.  Behavioral modification strategies: increasing lean protein intake, decreasing simple carbohydrates, meal planning and cooking strategies, keeping healthy foods in  the home, planning for success, and keeping a strict food journal.  [name] has agreed to follow-up with our clinic in 2 weeks. She was informed of the importance of frequent follow-up visits to maximize her success with intensive lifestyle modifications for her multiple health conditions.   Objective:   Blood pressure (!) 148/82, pulse 64, temperature 97.9 F (36.6 C), height 5' (1.524 m), weight 159 lb (72.1 kg), SpO2 99 %. Body mass index is 31.05 kg/m.  General: Cooperative, alert, well developed, in no acute distress. HEENT: Conjunctivae and lids unremarkable. Cardiovascular: Regular rhythm.  Lungs: Normal work of breathing. Neurologic: No focal deficits.   Lab Results  Component Value Date   CREATININE 0.76 09/02/2021   BUN 22 09/02/2021   NA 143 09/02/2021   K 4.5 09/02/2021   CL 106 09/02/2021   CO2 21 09/02/2021   Lab Results  Component Value Date   ALT 30 09/02/2021   AST 18 09/02/2021   ALKPHOS 62 09/02/2021   BILITOT 0.4 09/02/2021   Lab Results  Component Value Date   HGBA1C 5.6 07/22/2021   HGBA1C 5.7 (H) 01/03/2021   HGBA1C 5.7 (H) 06/03/2020   HGBA1C 5.7 (H) 04/03/2020   HGBA1C 5.6 09/09/2018   Lab Results  Component Value Date   INSULIN 12.0 04/03/2020   Lab Results  Component Value Date   TSH 1.250 09/02/2021   Lab Results  Component Value Date   CHOL 234 (H) 09/02/2021   HDL 48 09/02/2021   LDLCALC 165 (H) 09/02/2021   TRIG 118 09/02/2021   CHOLHDL 4.9 (H) 09/02/2021   Lab Results  Component Value Date   VD25OH 57.0 07/22/2021   VD25OH 18.9 (L) 01/03/2021   VD25OH 29.4 (L) 06/27/2020   Lab Results  Component Value Date   WBC 6.3 06/03/2020   HGB 14.6 06/03/2020   HCT 43.4 06/03/2020   MCV 97 06/03/2020   PLT 314 06/03/2020   Attestation Statements:   Reviewed by clinician on day of visit: allergies, medications, problem list, medical history, surgical history, family history, social history, and previous encounter  notes.  Time spent on visit including pre-visit chart review and post-visit care and charting was 32 minutes.   I, Insurance claims handler, CMA, am acting as Energy manager for William Hamburger, NP.  I have reviewed the above documentation for accuracy and completeness, and I agree with the above. -  Joydan Gretzinger d. Truxton Stupka, NP-C

## 2021-10-01 ENCOUNTER — Encounter (INDEPENDENT_AMBULATORY_CARE_PROVIDER_SITE_OTHER): Payer: Self-pay

## 2021-10-01 ENCOUNTER — Telehealth (INDEPENDENT_AMBULATORY_CARE_PROVIDER_SITE_OTHER): Payer: Self-pay | Admitting: Adult Health

## 2021-10-01 NOTE — Telephone Encounter (Signed)
Prior authorization approved for Saxenda. Patient sent mychart message.  

## 2021-10-14 ENCOUNTER — Ambulatory Visit (INDEPENDENT_AMBULATORY_CARE_PROVIDER_SITE_OTHER): Payer: BC Managed Care – PPO | Admitting: Adult Health

## 2021-10-14 ENCOUNTER — Other Ambulatory Visit: Payer: Self-pay

## 2021-10-14 ENCOUNTER — Other Ambulatory Visit (INDEPENDENT_AMBULATORY_CARE_PROVIDER_SITE_OTHER): Payer: Self-pay | Admitting: Adult Health

## 2021-10-14 ENCOUNTER — Encounter (INDEPENDENT_AMBULATORY_CARE_PROVIDER_SITE_OTHER): Payer: Self-pay | Admitting: Adult Health

## 2021-10-14 VITALS — BP 127/85 | HR 69 | Temp 97.7°F | Ht 60.0 in | Wt 160.0 lb

## 2021-10-14 DIAGNOSIS — I10 Essential (primary) hypertension: Secondary | ICD-10-CM | POA: Diagnosis not present

## 2021-10-14 DIAGNOSIS — Z6835 Body mass index (BMI) 35.0-35.9, adult: Secondary | ICD-10-CM | POA: Diagnosis not present

## 2021-10-14 NOTE — Telephone Encounter (Signed)
Pt last seen by Katy Danford, FNP.  

## 2021-10-14 NOTE — Progress Notes (Signed)
Chief Complaint:   OBESITY [name] is here to discuss her progress with her obesity treatment plan along with follow-up of her obesity related diagnoses. [name] is on keeping a food journal and adhering to recommended goals of 1100-1400 calories and 80 grams of protein and states she is following her eating plan approximately 50% of the time. [name] states she is walking for 15 minutes 4 times per week.  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  Reviewed bioimpedance with patient.    Saxenda 2.4 mg once weekly. She has been on this strength since 09/16/2021. She reports decreased hunger. She denies mass in neck, dysphagia, dyspepsia, persistent hoarseness, or GI upset.  Subjective:   1. Essential hypertension BP/HR much improved today. She is on Vasotec 20 mg daily- managed by her PCP/Dr. Neva Seat She denies tobacco/vape use. She denies acute cardiac sx's.  Assessment/Plan:   1. Essential hypertension Continue Vasotec 20 mg daily.  2. Obesity with current BMI 31.4  [name] is currently in the action stage of change. As such, her goal is to continue with weight loss efforts. She has agreed to keeping a food journal and adhering to recommended goals of 1100-1400 calories and 80 grams of protein.   Continue Saxenda 2.4 mg.  No need for refill today.  Exercise goals:  As is.  Behavioral modification strategies: increasing lean protein intake, decreasing simple carbohydrates, meal planning and cooking strategies, keeping healthy foods in the home, and planning for success.  [name] has agreed to follow-up with our clinic in 2 weeks. She  was informed of the importance of frequent follow-up visits to maximize her success with intensive lifestyle modifications for her multiple health conditions.   Objective:   Blood pressure 127/85, pulse 69, temperature 97.7 F (36.5 C), height 5' (1.524 m), weight 160 lb (72.6 kg), SpO2 98 %. Body mass index is 31.25 kg/m.  General: Cooperative, alert, well developed, in no acute distress. HEENT: Conjunctivae and lids unremarkable. Cardiovascular: Regular rhythm.  Lungs: Normal work of breathing. Neurologic: No focal deficits.   Lab Results  Component Value Date   CREATININE 0.76 09/02/2021   BUN 22 09/02/2021   NA 143 09/02/2021   K 4.5 09/02/2021   CL 106 09/02/2021   CO2 21 09/02/2021   Lab Results  Component Value Date   ALT 30 09/02/2021   AST 18 09/02/2021   ALKPHOS 62 09/02/2021   BILITOT 0.4 09/02/2021   Lab Results  Component Value Date   HGBA1C 5.6 07/22/2021   HGBA1C 5.7 (H) 01/03/2021   HGBA1C 5.7 (H) 06/03/2020   HGBA1C 5.7 (H) 04/03/2020   HGBA1C 5.6 09/09/2018   Lab Results  Component Value Date   INSULIN 12.0 04/03/2020   Lab Results  Component Value Date   TSH 1.250 09/02/2021   Lab Results  Component Value Date   CHOL 234 (H) 09/02/2021   HDL 48 09/02/2021   LDLCALC 165 (H) 09/02/2021   TRIG 118 09/02/2021   CHOLHDL 4.9 (H) 09/02/2021   Lab Results  Component Value Date   VD25OH 57.0 07/22/2021   VD25OH 18.9 (L) 01/03/2021   VD25OH 29.4 (L) 06/27/2020   Lab  Results  Component Value Date   WBC 6.3 06/03/2020   HGB 14.6 06/03/2020   HCT 43.4 06/03/2020   MCV 97 06/03/2020   PLT 314 06/03/2020   Attestation Statements:   Reviewed by clinician on day of visit: allergies, medications, problem list, medical history, surgical history, family history, social history, and previous encounter notes.  Time spent on visit including pre-visit chart review and post-visit care and charting was 28 minutes.   I, Insurance claims handler, CMA, am acting as  Energy manager for William Hamburger, NP.  I have reviewed the above documentation for accuracy and completeness, and I agree with the above. -  Danarius Mcconathy d. Kile Kabler, NP-C

## 2021-10-22 ENCOUNTER — Encounter (INDEPENDENT_AMBULATORY_CARE_PROVIDER_SITE_OTHER): Payer: Self-pay | Admitting: Adult Health

## 2021-10-22 NOTE — Telephone Encounter (Signed)
Please advise 

## 2021-10-28 ENCOUNTER — Other Ambulatory Visit: Payer: Self-pay

## 2021-10-28 ENCOUNTER — Ambulatory Visit (INDEPENDENT_AMBULATORY_CARE_PROVIDER_SITE_OTHER): Payer: BC Managed Care – PPO | Admitting: Adult Health

## 2021-10-28 ENCOUNTER — Encounter (INDEPENDENT_AMBULATORY_CARE_PROVIDER_SITE_OTHER): Payer: Self-pay | Admitting: Adult Health

## 2021-10-28 VITALS — BP 146/81 | HR 81 | Temp 98.7°F | Ht 60.0 in | Wt 158.0 lb

## 2021-10-28 DIAGNOSIS — Z9189 Other specified personal risk factors, not elsewhere classified: Secondary | ICD-10-CM | POA: Diagnosis not present

## 2021-10-28 DIAGNOSIS — R7303 Prediabetes: Secondary | ICD-10-CM | POA: Diagnosis not present

## 2021-10-28 DIAGNOSIS — Z6835 Body mass index (BMI) 35.0-35.9, adult: Secondary | ICD-10-CM | POA: Diagnosis not present

## 2021-10-28 MED ORDER — SAXENDA 18 MG/3ML ~~LOC~~ SOPN: 1.2000 mg | PEN_INJECTOR | Freq: Every day | SUBCUTANEOUS | 0 refills | Status: DC

## 2021-10-28 NOTE — Progress Notes (Signed)
Chief Complaint:   OBESITY [name] is here to discuss her progress with her obesity treatment plan along with follow-up of her obesity related diagnoses. [name] is on keeping a food journal and adhering to recommended goals of 1100-1400 calories and 80 grams of protein and states she is following her eating plan approximately 60% of the time. [name] states she is walking/strength training for 15-20 minutes 3 times per week.  Today's visit was #: 12 Starting weight: 183 lbs Starting date: 05/28/2021 Today's weight: 153 lbs Today's date: 10/28/2021 Total lbs lost to date: 30 lbs Total lbs lost since last in-office visit: 7 lbs  Interim History:  [name] stopped Saxenda due to possible injection site reactions - only symptom was redness at injection site. She denies respiratory symptoms - denied wheezing, tongue swelling, cough, dyspnea. She denies GI sx's. She also denies itching/swelling/discharge at injection site. She has been off Saxenda since 10/22/21  She also reports localized skin reactions to a "favorite scarf".  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  Subjective:   1. Pre-diabetes [name] stopped Bernie Covey due to possible injection site reactions - only symptom was redness at injection site. She denies respiratory symptoms - denied wheezing, tongue swelling, cough, dyspnea. She denies GI sx's. She also denies itching/swelling/discharge at injection site, again only flat redness noted at site. She has been off Saxenda since 10/22/21  2. At risk for side effect of medication [name] is at risk for medication side effect due to injection site reaction.  Assessment/Plan:   1. Pre-diabetes Restart Saxenda- 1.2 mg daily - if tolerated for 1 week, then increase to 1.8 mg daily - hold at this dose until next OV. If ANY respiratory reaction sx's develop- stop Saxenda and  seek immediate medical assistance. Pt verbalized understanding and agreement.  2. At risk for side effect of medication [name] was given approximately 15 minutes of drug side effect counseling today.  We discussed side effect possibility and risk versus benefits. [name] agreed to the medication and will contact this office if these side effects are intolerable.  Repetitive spaced learning was employed today to elicit superior memory formation and behavioral change.   3. Obesity with current BMI 32.5  - Restart Liraglutide -Weight Management (SAXENDA) 18 MG/3ML SOPN; Inject 1.2 mg into the skin daily. Inject 1.2mg  daily for one week and if tolerated well, increase to 1.8mg  daily  Dispense: 15 mL; Refill: 0  [name] is currently in the action stage of change. As such, her goal is to continue with weight loss efforts. She has agreed to keeping a food journal and adhering to recommended goals of 1100-1400 calories and 80 grams of protein.   Exercise goals:  As is.  Behavioral modification strategies: increasing lean protein intake, decreasing simple carbohydrates, meal planning and cooking strategies, keeping healthy foods in the home, planning for success, and keeping a strict food journal.  [name] has agreed to follow-up with our clinic in 2 weeks. She was informed of the importance of frequent follow-up visits to maximize her success with intensive lifestyle modifications for her multiple health conditions.   Objective:   Blood pressure (!) 146/81, pulse 81, temperature 98.7 F (37.1 C), height 5' (1.524 m), weight 158 lb (71.7 kg), SpO2 98 %. Body mass index is 30.86 kg/m.  General: Cooperative, alert, well developed, in no acute distress. HEENT: Conjunctivae and lids unremarkable. Cardiovascular: Regular rhythm.  Lungs: Normal  work of breathing. Neurologic: No focal deficits.   Lab Results  Component Value Date   CREATININE 0.76 09/02/2021   BUN 22 09/02/2021   NA 143  09/02/2021   K 4.5 09/02/2021   CL 106 09/02/2021   CO2 21 09/02/2021   Lab Results  Component Value Date   ALT 30 09/02/2021   AST 18 09/02/2021   ALKPHOS 62 09/02/2021   BILITOT 0.4 09/02/2021   Lab Results  Component Value Date   HGBA1C 5.6 07/22/2021   HGBA1C 5.7 (H) 01/03/2021   HGBA1C 5.7 (H) 06/03/2020   HGBA1C 5.7 (H) 04/03/2020   HGBA1C 5.6 09/09/2018   Lab Results  Component Value Date   INSULIN 12.0 04/03/2020   Lab Results  Component Value Date   TSH 1.250 09/02/2021   Lab Results  Component Value Date   CHOL 234 (H) 09/02/2021   HDL 48 09/02/2021   LDLCALC 165 (H) 09/02/2021   TRIG 118 09/02/2021   CHOLHDL 4.9 (H) 09/02/2021   Lab Results  Component Value Date   VD25OH 57.0 07/22/2021   VD25OH 18.9 (L) 01/03/2021   VD25OH 29.4 (L) 06/27/2020   Lab Results  Component Value Date   WBC 6.3 06/03/2020   HGB 14.6 06/03/2020   HCT 43.4 06/03/2020   MCV 97 06/03/2020   PLT 314 06/03/2020   Attestation Statements:   Reviewed by clinician on day of visit: allergies, medications, problem list, medical history, surgical history, family history, social history, and previous encounter notes.  I, Insurance claims handler, CMA, am acting as Energy manager for William Hamburger, NP.  I have reviewed the above documentation for accuracy and completeness, and I agree with the above. -  Dehlia Kilner d. Tomesha Sargent, NP-C

## 2021-10-30 ENCOUNTER — Ambulatory Visit: Payer: BC Managed Care – PPO | Admitting: Family Medicine

## 2021-11-11 ENCOUNTER — Encounter (INDEPENDENT_AMBULATORY_CARE_PROVIDER_SITE_OTHER): Payer: Self-pay | Admitting: Adult Health

## 2021-11-11 ENCOUNTER — Ambulatory Visit (INDEPENDENT_AMBULATORY_CARE_PROVIDER_SITE_OTHER): Payer: BC Managed Care – PPO | Admitting: Adult Health

## 2021-11-11 ENCOUNTER — Other Ambulatory Visit: Payer: Self-pay

## 2021-11-11 VITALS — BP 143/91 | HR 60 | Temp 97.9°F | Ht 60.0 in | Wt 162.0 lb

## 2021-11-11 DIAGNOSIS — Z6835 Body mass index (BMI) 35.0-35.9, adult: Secondary | ICD-10-CM

## 2021-11-11 DIAGNOSIS — R7303 Prediabetes: Secondary | ICD-10-CM | POA: Diagnosis not present

## 2021-11-11 DIAGNOSIS — E66812 Obesity, class 2: Secondary | ICD-10-CM

## 2021-11-11 DIAGNOSIS — Z9189 Other specified personal risk factors, not elsewhere classified: Secondary | ICD-10-CM

## 2021-11-11 MED ORDER — SAXENDA 18 MG/3ML ~~LOC~~ SOPN: 1.2000 mg | PEN_INJECTOR | Freq: Every day | SUBCUTANEOUS | 0 refills | Status: DC

## 2021-11-11 NOTE — Progress Notes (Signed)
Chief Complaint:   OBESITY [name] is here to discuss her progress with her obesity treatment plan along with follow-up of her obesity related diagnoses. [name] is on keeping a food journal and adhering to recommended goals of 1100-1400 calories and 80 grams of protein daily and states she is following her eating plan approximately 25% of the time. Shadiamond states she is walking and doing strengthening for 15-20 minutes 2-4 times per week.  Today's visit was #: 13 Starting weight: 183 lbs Starting date: 05/28/2021 Today's weight: 162 lbs Today's date: 11/11/2021 Total lbs lost to date: 21 Total lbs lost since last in-office visit: 0  Interim History:  [name] reports Christmas was total chaos.  She has house guests, work was "hell in Research [employer], political party", and lost power at home from inclement weather.  Her guests will be leaving today. She has one more holiday party to attend. She is pleased to have gained only 4 lbs.  2023- she will refocus efforts on jounraling plan and regular exercise.   She restarted Saxenda at 1.2 mg then increased to 1.8 mg.  After several doses at the 1.8mg  strength she experienced localized reaction with itching at injection site, but no respiratory symptoms.  She had been off Saxenda since 11/07/2021.  Subjective:   1. Pre-diabetes [name]'s last A1c was 5.6 on 08/01/2021.  She has had 3 previous A1c readings of 5.7.  She restarted Saxenda at 1.2 mg then increased to 1.8 mg.  After several doses at the 1.8mg  strength she experienced localized reaction with itching at injection site, but no respiratory symptoms.  She had been off Saxenda since 11/07/2021.  2. At risk for diabetes mellitus [name] is at higher than average risk for developing diabetes due to obesity, and pre-diabetes.   Assessment/Plan:   1. Pre-diabetes [name] agreed to restart Saxenda at 1.2 mg- will hold at this dose, and she will continue to work on weight loss, exercise, and  decreasing simple carbohydrates to help decrease the risk of diabetes.   2. At risk for diabetes mellitus [name] was given approximately 15 minutes of diabetes education and counseling today. We discussed intensive lifestyle modifications today with an emphasis on weight loss as well as increasing exercise and decreasing simple carbohydrates in her diet. We also reviewed medication options with an emphasis on risk versus benefit of those discussed.   Repetitive spaced learning was employed today to elicit superior memory formation and behavioral change.  3. Obesity with current BMI 32.5 [name] is currently in the action stage of change. As such, her goal is to continue with weight loss efforts. She has agreed to keeping a food journal and adhering to recommended goals of 1100-1400 calories and 80 grams of protein daily.   We discussed various medication options to help [name] with her weight loss efforts and we both agreed to restart Saxenda 1.2 mg daily, and hold at this dose.  - Liraglutide -Weight Management (SAXENDA) 18 MG/3ML SOPN; Inject 1.2 mg into the skin daily. Hold at this dose until next OV  Dispense: 15 mL; Refill: 0  Exercise goals: As is.  Behavioral modification strategies: increasing lean protein intake, decreasing simple carbohydrates, meal planning and cooking strategies, keeping healthy foods in the home, and planning for success.  [name] has agreed to follow-up with our clinic in 2 weeks. She was informed of the importance of frequent follow-up visits to maximize her success with intensive lifestyle modifications for her multiple health conditions.   Objective:  Blood pressure (!) 143/91, pulse 60, temperature 97.9 F (36.6 C), temperature source Oral, height 5' (1.524 m), weight 162 lb (73.5 kg), SpO2 98 %. Body mass index is 31.64 kg/m.  General: Cooperative, alert, well developed, in no acute distress. HEENT: Conjunctivae and lids  unremarkable. Cardiovascular: Regular rhythm.  Lungs: Normal work of breathing. Neurologic: No focal deficits.   Lab Results  Component Value Date   CREATININE 0.76 09/02/2021   BUN 22 09/02/2021   NA 143 09/02/2021   K 4.5 09/02/2021   CL 106 09/02/2021   CO2 21 09/02/2021   Lab Results  Component Value Date   ALT 30 09/02/2021   AST 18 09/02/2021   ALKPHOS 62 09/02/2021   BILITOT 0.4 09/02/2021   Lab Results  Component Value Date   HGBA1C 5.6 07/22/2021   HGBA1C 5.7 (H) 01/03/2021   HGBA1C 5.7 (H) 06/03/2020   HGBA1C 5.7 (H) 04/03/2020   HGBA1C 5.6 09/09/2018   Lab Results  Component Value Date   INSULIN 12.0 04/03/2020   Lab Results  Component Value Date   TSH 1.250 09/02/2021   Lab Results  Component Value Date   CHOL 234 (H) 09/02/2021   HDL 48 09/02/2021   LDLCALC 165 (H) 09/02/2021   TRIG 118 09/02/2021   CHOLHDL 4.9 (H) 09/02/2021   Lab Results  Component Value Date   VD25OH 57.0 07/22/2021   VD25OH 18.9 (L) 01/03/2021   VD25OH 29.4 (L) 06/27/2020   Lab Results  Component Value Date   WBC 6.3 06/03/2020   HGB 14.6 06/03/2020   HCT 43.4 06/03/2020   MCV 97 06/03/2020   PLT 314 06/03/2020   No results found for: IRON, TIBC, FERRITIN  Attestation Statements:   Reviewed by clinician on day of visit: allergies, medications, problem list, medical history, surgical history, family history, social history, and previous encounter notes.   Trude Mcburney, am acting as transcriptionist for William Hamburger, NP.  I have reviewed the above documentation for accuracy and completeness, and I agree with the above. -  Clemens Lachman d. Pearle Wandler, NP-C

## 2021-11-18 ENCOUNTER — Ambulatory Visit (INDEPENDENT_AMBULATORY_CARE_PROVIDER_SITE_OTHER): Payer: BC Managed Care – PPO | Admitting: Adult Health

## 2021-11-24 ENCOUNTER — Other Ambulatory Visit: Payer: Self-pay

## 2021-11-24 ENCOUNTER — Encounter (INDEPENDENT_AMBULATORY_CARE_PROVIDER_SITE_OTHER): Payer: Self-pay | Admitting: Adult Health

## 2021-11-24 ENCOUNTER — Ambulatory Visit (INDEPENDENT_AMBULATORY_CARE_PROVIDER_SITE_OTHER): Payer: BC Managed Care – PPO | Admitting: Adult Health

## 2021-11-24 VITALS — BP 149/84 | HR 67 | Temp 97.8°F | Ht 60.0 in | Wt 159.0 lb

## 2021-11-24 DIAGNOSIS — Z6835 Body mass index (BMI) 35.0-35.9, adult: Secondary | ICD-10-CM

## 2021-11-24 DIAGNOSIS — Z6831 Body mass index (BMI) 31.0-31.9, adult: Secondary | ICD-10-CM | POA: Diagnosis not present

## 2021-11-24 DIAGNOSIS — I10 Essential (primary) hypertension: Secondary | ICD-10-CM | POA: Diagnosis not present

## 2021-11-24 DIAGNOSIS — Z9189 Other specified personal risk factors, not elsewhere classified: Secondary | ICD-10-CM

## 2021-11-24 MED ORDER — SAXENDA 18 MG/3ML ~~LOC~~ SOPN: 1.8000 mg | PEN_INJECTOR | Freq: Every day | SUBCUTANEOUS | 0 refills | Status: DC

## 2021-11-24 MED ORDER — BD PEN NEEDLE NANO U/F 32G X 4 MM MISC: 0 refills | Status: DC

## 2021-11-24 NOTE — Progress Notes (Signed)
Chief Complaint:   OBESITY [name] is here to discuss her progress with her obesity treatment plan along with follow-up of her obesity related diagnoses. [name] is on keeping a food journal and adhering to recommended goals of 1100-1400 calories and 80 grams of protein and states she is following her eating plan approximately 50% of the time. [name] states she is doing strength training and walking for 20-30 minutes 2-3 times per week.  Today's visit was #: 14 Starting weight: 183 lbs Starting date: 05/28/2021 Today's weight: 159 lbs Today's date: 11/24/2021 Total lbs lost to date: 24 lbs Total lbs lost since last in-office visit: 3 lbs  Interim History:  Saxenda 1.2 mg - excessive polyphagia. She increased Saxenda to 1.8 mg daily - denies mass in neck, dysphagia, dyspepsia, persistent hoarseness, GI upset. She denies injection site reactions.  Weight goal - 145 pounds, current weight 159 lbs.  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  Subjective:   1. Essential hypertension SBP slightly elevated at office visit. She denies acute cardiac symptoms. She is on Vasotec 20 mg daily - she rushed to arrive at office visit.  2. At risk for heart disease [name] is at a higher than average risk for cardiovascular disease due to hypertension.  Assessment/Plan:   1. Essential hypertension Check BP/HR at home - bring log to follow-up office visit.  2. At risk for heart disease [name] was given approximately 15 minutes of coronary artery disease prevention counseling today. She is 61 y.o. female and has risk factors for heart disease including obesity. We discussed intensive lifestyle modifications today with an emphasis on specific weight loss instructions and strategies.   Repetitive spaced learning was employed today to elicit superior memory formation and behavioral change.  3. Obesity with current BMI 31.1  - Refill Liraglutide -Weight  Management (SAXENDA) 18 MG/3ML SOPN; Inject 1.8 mg into the skin daily. Hold at this dose until next OV  Dispense: 15 mL; Refill: 0 - Refill Insulin Pen Needle (BD PEN NEEDLE NANO U/F) 32G X 4 MM MISC; Use Nano needle with Ozempic  Dispense: 100 each; Refill: 0  [name] is currently in the action stage of change. As such, her goal is to continue with weight loss efforts. She has agreed to keeping a food journal and adhering to recommended goals of 1100-1400 calories and 80 grams of protein.   Exercise goals:  As is.  Behavioral modification strategies: increasing lean protein intake, decreasing simple carbohydrates, meal planning and cooking strategies, keeping healthy foods in the home, planning for success, and keeping a strict food journal.  [name] has agreed to follow-up with our clinic in 2 weeks. She was informed of the importance of frequent follow-up visits to maximize her success with intensive lifestyle modifications for her multiple health conditions.   Objective:   Blood pressure (!) 149/84, pulse 67, temperature 97.8 F (36.6 C), height 5' (1.524 m), weight 159 lb (72.1 kg), SpO2 98 %. Body mass index is 31.05 kg/m.  General: Cooperative, alert, well developed, in no acute distress. HEENT: Conjunctivae and lids unremarkable. Cardiovascular: Regular rhythm.  Lungs: Normal work of breathing. Neurologic: No focal deficits.   Lab Results  Component Value Date   CREATININE 0.76 09/02/2021   BUN 22 09/02/2021   NA 143 09/02/2021   K 4.5 09/02/2021   CL 106 09/02/2021   CO2 21 09/02/2021   Lab Results  Component Value Date   ALT 30 09/02/2021   AST 18  09/02/2021   ALKPHOS 62 09/02/2021   BILITOT 0.4 09/02/2021   Lab Results  Component Value Date   HGBA1C 5.6 07/22/2021   HGBA1C 5.7 (H) 01/03/2021   HGBA1C 5.7 (H) 06/03/2020   HGBA1C 5.7 (H) 04/03/2020   HGBA1C 5.6 09/09/2018   Lab Results  Component Value Date   INSULIN 12.0 04/03/2020   Lab Results   Component Value Date   TSH 1.250 09/02/2021   Lab Results  Component Value Date   CHOL 234 (H) 09/02/2021   HDL 48 09/02/2021   LDLCALC 165 (H) 09/02/2021   TRIG 118 09/02/2021   CHOLHDL 4.9 (H) 09/02/2021   Lab Results  Component Value Date   VD25OH 57.0 07/22/2021   VD25OH 18.9 (L) 01/03/2021   VD25OH 29.4 (L) 06/27/2020   Lab Results  Component Value Date   WBC 6.3 06/03/2020   HGB 14.6 06/03/2020   HCT 43.4 06/03/2020   MCV 97 06/03/2020   PLT 314 06/03/2020   Attestation Statements:   Reviewed by clinician on day of visit: allergies, medications, problem list, medical history, surgical history, family history, social history, and previous encounter notes.  I, Insurance claims handler, CMA, am acting as Energy manager for William Hamburger, NP.  I have reviewed the above documentation for accuracy and completeness, and I agree with the above. -  Amayah Staheli d. Hokulani Rogel, NP-C

## 2021-12-08 ENCOUNTER — Other Ambulatory Visit: Payer: Self-pay

## 2021-12-08 ENCOUNTER — Encounter (INDEPENDENT_AMBULATORY_CARE_PROVIDER_SITE_OTHER): Payer: Self-pay | Admitting: Adult Health

## 2021-12-08 ENCOUNTER — Ambulatory Visit (INDEPENDENT_AMBULATORY_CARE_PROVIDER_SITE_OTHER): Payer: BC Managed Care – PPO | Admitting: Adult Health

## 2021-12-08 ENCOUNTER — Ambulatory Visit: Payer: BC Managed Care – PPO | Admitting: Family Medicine

## 2021-12-08 VITALS — BP 130/74 | HR 68 | Temp 97.6°F | Ht 60.0 in | Wt 155.0 lb

## 2021-12-08 DIAGNOSIS — E669 Obesity, unspecified: Secondary | ICD-10-CM

## 2021-12-08 DIAGNOSIS — R7303 Prediabetes: Secondary | ICD-10-CM

## 2021-12-08 DIAGNOSIS — Z6830 Body mass index (BMI) 30.0-30.9, adult: Secondary | ICD-10-CM | POA: Diagnosis not present

## 2021-12-09 NOTE — Progress Notes (Signed)
Chief Complaint:   OBESITY [name] is here to discuss her progress with her obesity treatment plan along with follow-up of her obesity related diagnoses. [name] is on keeping a food journal and adhering to recommended goals of 1100-1400 calories and 80 grams of protein and states she is following her eating plan approximately 60% of the time. [name] states she is doing strength training/walking for 10-30 minutes 3 times per week.  Today's visit was #: 15 Starting weight: 183 lbs Starting date: 05/28/2021 Today's weight: 155 lbs Today's date: 12/08/2021 Total lbs lost to date: 28 lbs Total lbs lost since last in-office visit: 4 lbs  Interim History: [name] is on Victoza 1.8 mg- tolerating well. Fat mass 56 lbs, muscle mass 94 lbs,  MM >FM,  94 lbs > 56 lbs.  Subjective:   1. Pre-diabetes [name] is on Victoza 1.8 mg She denies mass in neck, dysphagia, dyspepsia, persistent hoarseness or GI upset. She denies injection site reactions.  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  Assessment/Plan:   1. Pre-diabetes Check fasting labs in 4 weeks. Continue Victoza 1.8 mg daily.  2. Obesity with current BMI 30.4  [name] is currently in the action stage of change. As such, her goal is to continue with weight loss efforts. She has agreed to keeping a food journal and adhering to recommended goals of 1100-1400 calories and 80 grams of protein.   Continue Victoza 1.8 mg daily.  Check fasting labs in 4 weeks.  Exercise goals:  As is.  Behavioral modification strategies: increasing lean protein intake, decreasing simple carbohydrates, increasing water intake, meal planning and cooking strategies, keeping healthy foods in the home, and planning for success.  Dorin has agreed to follow-up with our clinic in 2 weeks. She was informed of the importance of frequent follow-up visits  to maximize her success with intensive lifestyle modifications for her multiple health conditions.   Objective:   Blood pressure 130/74, pulse 68, temperature 97.6 F (36.4 C), height 5' (1.524 m), weight 155 lb (70.3 kg), SpO2 98 %. Body mass index is 30.27 kg/m.  General: Cooperative, alert, well developed, in no acute distress. HEENT: Conjunctivae and lids unremarkable. Cardiovascular: Regular rhythm.  Lungs: Normal work of breathing. Neurologic: No focal deficits.   Lab Results  Component Value Date   CREATININE 0.76 09/02/2021   BUN 22 09/02/2021   NA 143 09/02/2021   K 4.5 09/02/2021   CL 106 09/02/2021   CO2 21 09/02/2021   Lab Results  Component Value Date   ALT 30 09/02/2021   AST 18 09/02/2021   ALKPHOS 62 09/02/2021   BILITOT 0.4 09/02/2021   Lab Results  Component Value Date   HGBA1C 5.6 07/22/2021   HGBA1C 5.7 (H) 01/03/2021   HGBA1C 5.7 (H) 06/03/2020   HGBA1C 5.7 (H) 04/03/2020   HGBA1C 5.6 09/09/2018   Lab Results  Component Value Date   INSULIN 12.0 04/03/2020   Lab Results  Component Value Date   TSH 1.250 09/02/2021   Lab Results  Component Value Date   CHOL 234 (H) 09/02/2021   HDL 48 09/02/2021   LDLCALC 165 (H) 09/02/2021   TRIG 118 09/02/2021   CHOLHDL 4.9 (H) 09/02/2021   Lab Results  Component Value Date   VD25OH 57.0 07/22/2021   VD25OH 18.9 (L) 01/03/2021   VD25OH 29.4 (L) 06/27/2020   Lab Results  Component  Value Date   WBC 6.3 06/03/2020   HGB 14.6 06/03/2020   HCT 43.4 06/03/2020   MCV 97 06/03/2020   PLT 314 06/03/2020   Attestation Statements:   Reviewed by clinician on day of visit: allergies, medications, problem list, medical history, surgical history, family history, social history, and previous encounter notes.  Time spent on visit including pre-visit chart review and post-visit care and charting was 27 minutes.   I, Water quality scientist, CMA, am acting as Location manager for Mina Marble, NP.  I have reviewed  the above documentation for accuracy and completeness, and I agree with the above. -  Kamea Dacosta d. Flemon Kelty, NP-C

## 2021-12-22 ENCOUNTER — Ambulatory Visit (INDEPENDENT_AMBULATORY_CARE_PROVIDER_SITE_OTHER): Payer: BC Managed Care – PPO | Admitting: Adult Health

## 2021-12-22 ENCOUNTER — Encounter (INDEPENDENT_AMBULATORY_CARE_PROVIDER_SITE_OTHER): Payer: Self-pay | Admitting: Adult Health

## 2021-12-22 ENCOUNTER — Other Ambulatory Visit: Payer: Self-pay

## 2021-12-22 VITALS — BP 120/74 | HR 64 | Temp 98.1°F | Ht 60.0 in | Wt 155.0 lb

## 2021-12-22 DIAGNOSIS — Z6835 Body mass index (BMI) 35.0-35.9, adult: Secondary | ICD-10-CM

## 2021-12-22 DIAGNOSIS — R112 Nausea with vomiting, unspecified: Secondary | ICD-10-CM | POA: Diagnosis not present

## 2021-12-22 DIAGNOSIS — Z9189 Other specified personal risk factors, not elsewhere classified: Secondary | ICD-10-CM

## 2021-12-22 DIAGNOSIS — R7303 Prediabetes: Secondary | ICD-10-CM | POA: Diagnosis not present

## 2021-12-23 ENCOUNTER — Encounter (INDEPENDENT_AMBULATORY_CARE_PROVIDER_SITE_OTHER): Payer: Self-pay | Admitting: Adult Health

## 2021-12-23 LAB — COMPREHENSIVE METABOLIC PANEL
ALT: 43 IU/L — ABNORMAL HIGH (ref 0–32)
AST: 24 IU/L (ref 0–40)
Albumin/Globulin Ratio: 2.1 (ref 1.2–2.2)
Albumin: 4.6 g/dL (ref 3.8–4.8)
Alkaline Phosphatase: 78 IU/L (ref 44–121)
BUN/Creatinine Ratio: 28 (ref 12–28)
BUN: 25 mg/dL (ref 8–27)
Bilirubin Total: 0.2 mg/dL (ref 0.0–1.2)
CO2: 25 mmol/L (ref 20–29)
Calcium: 9.8 mg/dL (ref 8.7–10.3)
Chloride: 102 mmol/L (ref 96–106)
Creatinine, Ser: 0.88 mg/dL (ref 0.57–1.00)
Globulin, Total: 2.2 g/dL (ref 1.5–4.5)
Glucose: 93 mg/dL (ref 70–99)
Potassium: 4.1 mmol/L (ref 3.5–5.2)
Sodium: 142 mmol/L (ref 134–144)
Total Protein: 6.8 g/dL (ref 6.0–8.5)
eGFR: 75 mL/min/{1.73_m2} (ref >59)

## 2021-12-23 LAB — AMYLASE: Amylase: 62 U/L (ref 31–110)

## 2021-12-23 LAB — LIPASE: Lipase: 71 U/L (ref 14–72)

## 2021-12-23 NOTE — Telephone Encounter (Signed)
FYI

## 2021-12-23 NOTE — Progress Notes (Signed)
Chief Complaint:   OBESITY [name] is here to discuss her progress with her obesity treatment plan along with follow-up of her obesity related diagnoses. [name] is on keeping a food journal and adhering to recommended goals of 1100-1400 calories and 80 grams of protein and states she is following her eating plan approximately 70% of the time. [name] states she is walking/strength training for 30 minutes 5 times per week.  Today's visit was #: 16 Starting weight: 183 lbs Starting date: 05/28/2021 Today's weight: 155 lbs Today's date: 12/22/2021 Total lbs lost to date: 28 lbs Total lbs lost since last in-office visit: 0  Interim History:  N/V/diarrhea - last dose of Victoza 1.8 mg on 12/16/2021.  He remained off the GLP-1 since then. Extreme polyphagia on 12/20/2021. Been on GLP-1 therapy since 06/11/2021- until recently been tolerating well. She has been on Saxenda 1.8mg  since Oct 2022.  Subjective:   1. Pre-diabetes N/V/diarrhea - last dose of Victoza 1.8 mg on 12/16/2021.  He remained off the GLP-1 since then. Extreme polyphagia on 12/20/2021. Been on GLP-1 therapy since 06/11/2021-  Saxenda 1.8mg  since Oct 2022. Until recently been tolerating well. She again denies family hx of MTC or personal hx of pancreatitis.  2. Nausea and vomiting, unspecified vomiting type Early last week experienced N/V/D-  she denies over eating prior to sx onset. She denies abdominal pain or hematochezia. She stopped Saxenda 1.8mg  on 12/16/21- been off GLP-1 therapy now for 6 days. She denies any current GI upset at present.  3. At risk for deficient intake of food [name] is at risk for deficient intake of food due to recent N/V/diarrhea.  Assessment/Plan:   1. Pre-diabetes Remain off Saxenda.   Follow journaling plan.  2. Nausea and vomiting, unspecified vomiting type Check lipase, CMP - MyChart patient after results.  - Lipase - Comprehensive metabolic panel - Amylase  3. At risk for  deficient intake of food [name] was given approximately 15 minutes of deficient intake of food prevention counseling today. [name] is at risk for eating too few calories based on current food recall. She was encouraged to focus on meeting caloric and protein goals according to her recommended meal plan.  4. Obesity with current BMI 31.1  [name] is currently in the action stage of change. As such, her goal is to continue with weight loss efforts. She has agreed to keeping a food journal and adhering to recommended goals of 1100-1400 calories and 80 grams of protein.   Check lipase - MyChart tomorrow with results, directions on Victoza.  Not fasting.  Exercise goals:  As is.  Behavioral modification strategies: increasing lean protein intake, decreasing simple carbohydrates, meal planning and cooking strategies, keeping healthy foods in the home, and planning for success.  [name] has agreed to follow-up with our clinic in 2 weeks, fasting. She was informed of the importance of frequent follow-up visits to maximize her success with intensive lifestyle modifications for her multiple health conditions.   Objective:   Blood pressure 120/74, pulse 64, temperature 98.1 F (36.7 C), height 5' (1.524 m), weight 155 lb (70.3 kg), SpO2 97 %. Body mass index is 30.27 kg/m.  General: Cooperative, alert, well developed, in no acute distress. HEENT: Conjunctivae and lids unremarkable. Cardiovascular: Regular rhythm.  Lungs: Normal work of breathing. Neurologic: No focal deficits.   Lab Results  Component Value Date   CREATININE 0.88 12/22/2021   BUN 25 12/22/2021   NA 142 12/22/2021   K 4.1 12/22/2021  CL 102 12/22/2021   CO2 25 12/22/2021   Lab Results  Component Value Date   ALT 43 (H) 12/22/2021   AST 24 12/22/2021   ALKPHOS 78 12/22/2021   BILITOT 0.2 12/22/2021   Lab Results  Component Value Date   HGBA1C 5.6 07/22/2021   HGBA1C 5.7 (H) 01/03/2021   HGBA1C 5.7 (H)  06/03/2020   HGBA1C 5.7 (H) 04/03/2020   HGBA1C 5.6 09/09/2018   Lab Results  Component Value Date   INSULIN 12.0 04/03/2020   Lab Results  Component Value Date   TSH 1.250 09/02/2021   Lab Results  Component Value Date   CHOL 234 (H) 09/02/2021   HDL 48 09/02/2021   LDLCALC 165 (H) 09/02/2021   TRIG 118 09/02/2021   CHOLHDL 4.9 (H) 09/02/2021   Lab Results  Component Value Date   VD25OH 57.0 07/22/2021   VD25OH 18.9 (L) 01/03/2021   VD25OH 29.4 (L) 06/27/2020   Lab Results  Component Value Date   WBC 6.3 06/03/2020   HGB 14.6 06/03/2020   HCT 43.4 06/03/2020   MCV 97 06/03/2020   PLT 314 06/03/2020   Attestation Statements:   Reviewed by clinician on day of visit: allergies, medications, problem list, medical history, surgical history, family history, social history, and previous encounter notes.  I, Insurance claims handler, CMA, am acting as Energy manager for Deanna Hamburger, NP.  I have reviewed the above documentation for accuracy and completeness, and I agree with the above. -  Brenly Trawick d. Demetris Meinhardt, NP-C

## 2022-01-05 ENCOUNTER — Other Ambulatory Visit: Payer: Self-pay | Admitting: Family Medicine

## 2022-01-05 DIAGNOSIS — I10 Essential (primary) hypertension: Secondary | ICD-10-CM

## 2022-01-08 ENCOUNTER — Ambulatory Visit (INDEPENDENT_AMBULATORY_CARE_PROVIDER_SITE_OTHER): Payer: BC Managed Care – PPO | Admitting: Adult Health

## 2022-01-08 ENCOUNTER — Other Ambulatory Visit: Payer: Self-pay

## 2022-01-08 ENCOUNTER — Encounter (INDEPENDENT_AMBULATORY_CARE_PROVIDER_SITE_OTHER): Payer: Self-pay | Admitting: Adult Health

## 2022-01-08 VITALS — BP 131/77 | HR 63 | Temp 97.4°F | Ht 60.0 in | Wt 153.0 lb

## 2022-01-08 DIAGNOSIS — I10 Essential (primary) hypertension: Secondary | ICD-10-CM | POA: Diagnosis not present

## 2022-01-08 DIAGNOSIS — E039 Hypothyroidism, unspecified: Secondary | ICD-10-CM

## 2022-01-08 DIAGNOSIS — E669 Obesity, unspecified: Secondary | ICD-10-CM

## 2022-01-08 DIAGNOSIS — R7303 Prediabetes: Secondary | ICD-10-CM

## 2022-01-08 DIAGNOSIS — Z6829 Body mass index (BMI) 29.0-29.9, adult: Secondary | ICD-10-CM

## 2022-01-08 DIAGNOSIS — E782 Mixed hyperlipidemia: Secondary | ICD-10-CM

## 2022-01-08 DIAGNOSIS — E559 Vitamin D deficiency, unspecified: Secondary | ICD-10-CM

## 2022-01-08 MED ORDER — ENALAPRIL MALEATE 20 MG PO TABS: 20.0000 mg | ORAL_TABLET | Freq: Every day | ORAL | 1 refills | Status: DC

## 2022-01-08 NOTE — Progress Notes (Signed)
Chief Complaint:   OBESITY [name] is here to discuss her progress with her obesity treatment plan along with follow-up of her obesity related diagnoses. [name] is on keeping a food journal and adhering to recommended goals of 1100-1400 calories and 80 grams of protein and states she is following her eating plan approximately 50% of the time. [name] states she is walking/strength training for 20 minutes 1 time per week.  Today's visit was #: 3 Starting weight: 183 lbs Starting date: 05/28/2021 Today's weight: 153 lbs Today's date: 01/08/2022 Total lbs lost to date: 30 lbs Total lbs lost since last in-office visit: 2 lbs  Interim History:  [name] has remained off Victoza 1.8 mg since 12/16/2021. She denies current GI upset.  Subjective:   1. Pre-diabetes Unable to tolerate Victoza 1.8 mg - N/V/D - last dose on 12/16/2021. She has been on Saxenda 1.8mg  since Oct 2022. Been on GLP-1 therapy since 06/11/2021- until recently been tolerating well.  2. Hypothyroidism, unspecified type She denies fatigue. She is on levothyroxine 100 mcg daily - managed by PCP.  3. Vitamin D deficiency She is on an OTC multivitamin.  4. Essential hypertension Bridge refill requested of Vasotec 20 mg - managed by PCP. She denies acute cardiac symptoms.  5. Mixed hyperlipidemia Family history - father/brother - hyperlipidemia. Previously intolerant to atorvastatin - brain fog. Agreeable to Crestor if needed.  Assessment/Plan:   1. Pre-diabetes Check labs. Remain off Victoza.  - Hemoglobin A1c - Insulin, random - Lipase  2. Hypothyroidism, unspecified type Check labs today.  - TSH+T4F+T3Free  3. Vitamin D deficiency Check vitamin D level today.  - VITAMIN D 25 Hydroxy (Vit-D Deficiency, Fractures)  4. Essential hypertension Check labs. Refill Vasotec 20 mg daily.  - Comprehensive metabolic panel - Refill enalapril (VASOTEC) 20 MG tablet; Take 1 tablet (20 mg total) by  mouth daily.  Dispense: 30 tablet; Refill: 1  5. Mixed hyperlipidemia Check lipid panel today.  - Lipid panel  6. Obesity with current BMI 29.9  Crysal is currently in the action stage of change. As such, her goal is to continue with weight loss efforts. She has agreed to keeping a food journal and adhering to recommended goals of 1100-1400 calories and 80 grams of protein.   1) Wear watch.  2) Stroll on weekend.  Exercise goals:  As is.  Behavioral modification strategies: increasing lean protein intake, decreasing simple carbohydrates, meal planning and cooking strategies, keeping healthy foods in the home, and planning for success.  [name] has agreed to follow-up with our clinic in 2 weeks. She was informed of the importance of frequent follow-up visits to maximize her success with intensive lifestyle modifications for her multiple health conditions.   [name] was informed we would discuss her lab results at her next visit unless there is a critical issue that needs to be addressed sooner. [name] agreed to keep her next visit at the agreed upon time to discuss these results.  Objective:   Blood pressure 131/77, pulse 63, temperature (!) 97.4 F (36.3 C), height 5' (1.524 m), weight 153 lb (69.4 kg), SpO2 99 %. Body mass index is 29.88 kg/m.  General: Cooperative, alert, well developed, in no acute distress. HEENT: Conjunctivae and lids unremarkable. Cardiovascular: Regular rhythm.  Lungs: Normal work of breathing. Neurologic: No focal deficits.   Lab Results  Component Value Date   CREATININE 0.88 12/22/2021   BUN 25 12/22/2021   NA 142 12/22/2021   K 4.1 12/22/2021  CL 102 12/22/2021   CO2 25 12/22/2021   Lab Results  Component Value Date   ALT 43 (H) 12/22/2021   AST 24 12/22/2021   ALKPHOS 78 12/22/2021   BILITOT 0.2 12/22/2021   Lab Results  Component Value Date   HGBA1C 5.6 07/22/2021   HGBA1C 5.7 (H) 01/03/2021   HGBA1C 5.7 (H) 06/03/2020    HGBA1C 5.7 (H) 04/03/2020   HGBA1C 5.6 09/09/2018   Lab Results  Component Value Date   INSULIN 12.0 04/03/2020   Lab Results  Component Value Date   TSH 1.250 09/02/2021   Lab Results  Component Value Date   CHOL 234 (H) 09/02/2021   HDL 48 09/02/2021   LDLCALC 165 (H) 09/02/2021   TRIG 118 09/02/2021   CHOLHDL 4.9 (H) 09/02/2021   Lab Results  Component Value Date   VD25OH 57.0 07/22/2021   VD25OH 18.9 (L) 01/03/2021   VD25OH 29.4 (L) 06/27/2020   Lab Results  Component Value Date   WBC 6.3 06/03/2020   HGB 14.6 06/03/2020   HCT 43.4 06/03/2020   MCV 97 06/03/2020   PLT 314 06/03/2020   Attestation Statements:   Reviewed by clinician on day of visit: allergies, medications, problem list, medical history, surgical history, family history, social history, and previous encounter notes.  I, Water quality scientist, CMA, am acting as Location manager for Mina Marble, NP.  I have reviewed the above documentation for accuracy and completeness, and I agree with the above. -  Emmanual Gauthreaux d. Abdo Denault, NP-C

## 2022-01-09 LAB — TSH+T4F+T3FREE
Free T4: 1.98 ng/dL — ABNORMAL HIGH (ref 0.82–1.77)
T3, Free: 2.7 pg/mL (ref 2.0–4.4)
TSH: 0.43 u[IU]/mL — ABNORMAL LOW (ref 0.450–4.500)

## 2022-01-09 LAB — LIPID PANEL
Chol/HDL Ratio: 4.1 ratio (ref 0.0–4.4)
Cholesterol, Total: 257 mg/dL — ABNORMAL HIGH (ref 100–199)
HDL: 63 mg/dL (ref >39)
LDL Chol Calc (NIH): 177 mg/dL — ABNORMAL HIGH (ref 0–99)
Triglycerides: 96 mg/dL (ref 0–149)
VLDL Cholesterol Cal: 17 mg/dL (ref 5–40)

## 2022-01-09 LAB — COMPREHENSIVE METABOLIC PANEL
ALT: 33 IU/L — ABNORMAL HIGH (ref 0–32)
AST: 25 IU/L (ref 0–40)
Albumin/Globulin Ratio: 2.2 (ref 1.2–2.2)
Albumin: 4.6 g/dL (ref 3.8–4.8)
Alkaline Phosphatase: 68 IU/L (ref 44–121)
BUN/Creatinine Ratio: 25 (ref 12–28)
BUN: 20 mg/dL (ref 8–27)
Bilirubin Total: 0.4 mg/dL (ref 0.0–1.2)
CO2: 23 mmol/L (ref 20–29)
Calcium: 9.6 mg/dL (ref 8.7–10.3)
Chloride: 104 mmol/L (ref 96–106)
Creatinine, Ser: 0.79 mg/dL (ref 0.57–1.00)
Globulin, Total: 2.1 g/dL (ref 1.5–4.5)
Glucose: 102 mg/dL — ABNORMAL HIGH (ref 70–99)
Potassium: 4.8 mmol/L (ref 3.5–5.2)
Sodium: 142 mmol/L (ref 134–144)
Total Protein: 6.7 g/dL (ref 6.0–8.5)
eGFR: 85 mL/min/{1.73_m2} (ref >59)

## 2022-01-09 LAB — HEMOGLOBIN A1C
Est. average glucose Bld gHb Est-mCnc: 117 mg/dL
Hgb A1c MFr Bld: 5.7 % — ABNORMAL HIGH (ref 4.8–5.6)

## 2022-01-09 LAB — LIPASE: Lipase: 35 U/L (ref 14–72)

## 2022-01-09 LAB — INSULIN, RANDOM: INSULIN: 7.7 u[IU]/mL (ref 2.6–24.9)

## 2022-01-09 LAB — VITAMIN D 25 HYDROXY (VIT D DEFICIENCY, FRACTURES): Vit D, 25-Hydroxy: 31.5 ng/mL (ref 30.0–100.0)

## 2022-01-19 ENCOUNTER — Encounter (INDEPENDENT_AMBULATORY_CARE_PROVIDER_SITE_OTHER): Payer: Self-pay | Admitting: Adult Health

## 2022-01-19 ENCOUNTER — Ambulatory Visit (INDEPENDENT_AMBULATORY_CARE_PROVIDER_SITE_OTHER): Payer: BC Managed Care – PPO | Admitting: Adult Health

## 2022-01-19 ENCOUNTER — Other Ambulatory Visit: Payer: Self-pay

## 2022-01-19 VITALS — BP 121/72 | HR 92 | Temp 99.0°F | Ht 60.0 in | Wt 154.0 lb

## 2022-01-19 DIAGNOSIS — E782 Mixed hyperlipidemia: Secondary | ICD-10-CM

## 2022-01-19 DIAGNOSIS — Z6830 Body mass index (BMI) 30.0-30.9, adult: Secondary | ICD-10-CM

## 2022-01-19 DIAGNOSIS — Z6835 Body mass index (BMI) 35.0-35.9, adult: Secondary | ICD-10-CM

## 2022-01-19 DIAGNOSIS — R7303 Prediabetes: Secondary | ICD-10-CM

## 2022-01-19 DIAGNOSIS — E669 Obesity, unspecified: Secondary | ICD-10-CM

## 2022-01-19 DIAGNOSIS — I10 Essential (primary) hypertension: Secondary | ICD-10-CM | POA: Diagnosis not present

## 2022-01-19 DIAGNOSIS — Z9189 Other specified personal risk factors, not elsewhere classified: Secondary | ICD-10-CM

## 2022-01-19 DIAGNOSIS — E039 Hypothyroidism, unspecified: Secondary | ICD-10-CM | POA: Diagnosis not present

## 2022-01-19 DIAGNOSIS — E559 Vitamin D deficiency, unspecified: Secondary | ICD-10-CM

## 2022-01-19 MED ORDER — METFORMIN HCL 500 MG PO TABS: ORAL_TABLET | ORAL | 0 refills | Status: DC

## 2022-01-20 NOTE — Progress Notes (Signed)
? ? ? ?Chief Complaint:  ? ?OBESITY ?[name] is here to discuss her progress with her obesity treatment plan along with follow-up of her obesity related diagnoses. [name] is on keeping a food journal and adhering to recommended goals of 1100-1400 calories and 80 grams of protein and states she is following her eating plan approximately 50% of the time. [name] states she is walking/strength training for 20-30 minutes 5 times per week. ? ?Today's visit was #: 18 ?Starting weight: 183 lbs ?Starting date: 05/28/2021 ?Today's weight: 154 lbs ?Today's date: 01/19/2022 ?Total lbs lost to date: 29 lbs ?Total lbs lost since last in-office visit: 0 ? ?Interim History:  ?Since her last office visit - [name] has been unable to consistently track or achieve her calorie/protein goals. ?She essentially maintained her weight with only 1 lb increase since last OV. ? ?Subjective:  ? ?1. Essential hypertension ?Discussed labs with patient today.  ?BP/HR at goal at office visit. ?On 01/08/2022, CMP - electrolytes, kidney function - stable. ? ?2. Vitamin D deficiency ?Worsening.  Discussed labs with patient today.  ?On 01/08/2022, vitamin D level - 31.5 - below goal of 50-70. ?She is on OTC Centrum multivitamin - inconsistent use. ? ?3. Mixed hyperlipidemia ?Worsening.  Discussed labs with patient today.  ?On 01/08/2022, total/LDL both worsened with improved HDL of 63. ?ASCVD risk stratification 4.8%.  father with Resistant hyperlipidemia. ? ?4. Hypothyroidism, unspecified type ?Discussed labs with patient today.  ?On 01/08/2022 - suppressed TSH 0.430, elevated free T4. ?She has been on levothyroxine 100 mcg daily for years - managed by PCP.  Denies cardiac symptoms. ? ?5. Pre-diabetes ?Worsening.  Discussed labs with patient today.  ?On 01/08/2022, A1c 5.7 - worsened from last check. ?Denies 1st degree family history of T2D. ?Been on GLP-1 therapy since 06/11/2021- until recently been tolerating well. ?GLP-1 therapy was d/c'd due to SE-  N/V/D ?Lat dose of Victoza 1.8mg  on 12/16/21. ?She denies any current GI upset. ? ? ?6. At risk for diarrhea ?[name] is at higher risk of diarrhea due to starting metformin.  ? ?Assessment/Plan:  ? ?1. Essential hypertension ?Bridge prescription refill provided at last office visit - follow-up with PCP. ? ?2. Vitamin D deficiency ?Restart ergocalciferol, daily OTC multivitamin. ? ?3. Mixed hyperlipidemia ?Follow-up with PCP. ? ?4. Hypothyroidism, unspecified type ?Follow-up with PCP. ? ?5. Pre-diabetes ?Remain off GLP-1 therapy.   ?Start metformin 500 mg 1/2 tablet with lunch for 1 week, increase to a full tablet with lunch week 2 and hold at this dose. ?Decrease carbohydrate/sugar intake.   ?Increase protein intake. ? ?- Start metFORMIN (GLUCOPHAGE) 500 MG tablet; 1/2 tab with lunch for one week, increase to full tab with lunch week two-hold at this dose.  Dispense: 30 tablet; Refill: 0 ? ?6. At risk for diarrhea ?[name] was given approximately 15 minutes of diarrhea prevention counseling today. She is 61 y.o. female and has risk factors for diarrhea including medications and changes in diet. We discussed intensive lifestyle modifications today with an emphasis on specific weight loss instructions including dietary strategies.  ? ?Repetitive spaced learning was employed today to elicit superior memory formation and behavioral change. ? ?7. Obesity with current BMI 30.1 ? ?[name] is currently in the action stage of change. As such, her goal is to continue with weight loss efforts. She has agreed to keeping a food journal and adhering to recommended goals of 1100-1400 calories and 80 grams of protein.  ? ?Exercise goals:  As is. ? ?Behavioral modification strategies: increasing  lean protein intake, decreasing simple carbohydrates, meal planning and cooking strategies, keeping healthy foods in the home, and planning for success. ? ?[name] has agreed to follow-up with our clinic in 2 weeks. She was informed of  the importance of frequent follow-up visits to maximize her success with intensive lifestyle modifications for her multiple health conditions.  ? ?Objective:  ? ?Blood pressure 121/72, pulse 92, temperature 99 ?F (37.2 ?C), height 5' (1.524 m), weight 154 lb (69.9 kg), SpO2 99 %. ?Body mass index is 30.08 kg/m?. ? ?General: Cooperative, alert, well developed, in no acute distress. ?HEENT: Conjunctivae and lids unremarkable. ?Cardiovascular: Regular rhythm.  ?Lungs: Normal work of breathing. ?Neurologic: No focal deficits.  ? ?Lab Results  ?Component Value Date  ? CREATININE 0.79 01/08/2022  ? BUN 20 01/08/2022  ? NA 142 01/08/2022  ? K 4.8 01/08/2022  ? CL 104 01/08/2022  ? CO2 23 01/08/2022  ? ?Lab Results  ?Component Value Date  ? ALT 33 (H) 01/08/2022  ? AST 25 01/08/2022  ? ALKPHOS 68 01/08/2022  ? BILITOT 0.4 01/08/2022  ? ?Lab Results  ?Component Value Date  ? HGBA1C 5.7 (H) 01/08/2022  ? HGBA1C 5.6 07/22/2021  ? HGBA1C 5.7 (H) 01/03/2021  ? HGBA1C 5.7 (H) 06/03/2020  ? HGBA1C 5.7 (H) 04/03/2020  ? ?Lab Results  ?Component Value Date  ? INSULIN 7.7 01/08/2022  ? INSULIN 12.0 04/03/2020  ? ?Lab Results  ?Component Value Date  ? TSH 0.430 (L) 01/08/2022  ? ?Lab Results  ?Component Value Date  ? CHOL 257 (H) 01/08/2022  ? HDL 63 01/08/2022  ? LDLCALC 177 (H) 01/08/2022  ? TRIG 96 01/08/2022  ? CHOLHDL 4.1 01/08/2022  ? ?Lab Results  ?Component Value Date  ? VD25OH 31.5 01/08/2022  ? VD25OH 57.0 07/22/2021  ? VD25OH 18.9 (L) 01/03/2021  ? ?Lab Results  ?Component Value Date  ? WBC 6.3 06/03/2020  ? HGB 14.6 06/03/2020  ? HCT 43.4 06/03/2020  ? MCV 97 06/03/2020  ? PLT 314 06/03/2020  ? ?Attestation Statements:  ? ?Reviewed by clinician on day of visit: allergies, medications, problem list, medical history, surgical history, family history, social history, and previous encounter notes. ? ?I, [name], CMA, am acting as [name] for Deanna Hamburger, [name]. ? ?I have reviewed the above documentation for accuracy  and completeness, and I agree with the above. -  Deanna View Grosser d. Lauralynn Loeb, [name] ?

## 2022-01-30 ENCOUNTER — Encounter: Payer: Self-pay | Admitting: Family Medicine

## 2022-01-30 ENCOUNTER — Ambulatory Visit (INDEPENDENT_AMBULATORY_CARE_PROVIDER_SITE_OTHER): Payer: BC Managed Care – PPO | Admitting: Family Medicine

## 2022-01-30 VITALS — BP 128/72 | HR 73 | Temp 98.1°F | Resp 16 | Ht 60.0 in | Wt 158.0 lb

## 2022-01-30 DIAGNOSIS — R7303 Prediabetes: Secondary | ICD-10-CM

## 2022-01-30 DIAGNOSIS — E039 Hypothyroidism, unspecified: Secondary | ICD-10-CM | POA: Diagnosis not present

## 2022-01-30 DIAGNOSIS — Z1231 Encounter for screening mammogram for malignant neoplasm of breast: Secondary | ICD-10-CM | POA: Diagnosis not present

## 2022-01-30 DIAGNOSIS — Z1211 Encounter for screening for malignant neoplasm of colon: Secondary | ICD-10-CM

## 2022-01-30 DIAGNOSIS — Z23 Encounter for immunization: Secondary | ICD-10-CM | POA: Diagnosis not present

## 2022-01-30 DIAGNOSIS — I10 Essential (primary) hypertension: Secondary | ICD-10-CM

## 2022-01-30 DIAGNOSIS — E782 Mixed hyperlipidemia: Secondary | ICD-10-CM | POA: Diagnosis not present

## 2022-01-30 DIAGNOSIS — Z0000 Encounter for general adult medical examination without abnormal findings: Secondary | ICD-10-CM

## 2022-01-30 MED ORDER — ENALAPRIL MALEATE 20 MG PO TABS: 20.0000 mg | ORAL_TABLET | Freq: Every day | ORAL | 3 refills | Status: DC

## 2022-01-30 MED ORDER — LEVOTHYROXINE SODIUM 100 MCG PO TABS: 100.0000 ug | ORAL_TABLET | Freq: Every day | ORAL | 1 refills | Status: DC

## 2022-01-30 NOTE — Progress Notes (Signed)
? ?Subjective:  ?Patient ID: [name], female    DOB: 1961/04/06  Age: 61 y.o. MRN: [id number] ? ?CC:  ?Chief Complaint  ?Patient presents with  ? Annual Exam  ?  Pt had recent lab tests in February, pt does notes some concerns about recent lab work  ? ? ?HPI ?Heide Scales Liddell presents for  ?Annual physical exam ?[facility]. ?Treated with vitamin D for vitamin D deficiency, hyperlipidemia with ASCVD risk for 4.8%.  Treated with GLP prior for prediabetes.  Discontinued due to nausea/side effects.  Concern for pancreatitis - upper limit lipase week after stopping and lower on recheck. Started on metformin 500 mg with initial half dosing for tolerability ?Doing ok  - just staring full pill.  ?Lab Results  ?Component Value Date  ? HGBA1C 5.7 (H) 01/08/2022  ? ? ?Hypothyroidism: ?Lab Results  ?Component Value Date  ? TSH 0.430 (L) 01/08/2022  ?Taking medication daily.  Synthroid 100 mcg.  Slightly low TSH recently.  Free T4 slightly elevated at 1.98.  T3 normal 2.7.  Labs on 01/08/2022. ?No new hot or cold intolerance. No new hair or skin changes, heart palpitations or new fatigue. No new weight changes.  ? ?Hypertension: ?Enalapril 20mg  qd.  ?Home readings:none. No new side effects.  ?BP Readings from Last 3 Encounters:  ?01/30/22 128/72  ?01/19/22 121/72  ?01/08/22 131/77  ? ?Lab Results  ?Component Value Date  ? CREATININE 0.79 01/08/2022  ? ?Hyperlipidemia: ?The 10-year ASCVD risk score (Arnett DK, et al., 2019) is: 5.4% ?  Values used to calculate the score: ?    Age: 74 years ?    Sex: Female ?    Is Non-Hispanic African American: No ?    Diabetic: No ?    Tobacco smoker: No ?    Systolic Blood Pressure: 128 mmHg ?    Is BP treated: Yes ?    HDL Cholesterol: 63 mg/dL ?    Total Cholesterol: 257 mg/dL ?Father in 67's, aunt in 75's, grandmother in 65's with heart disease.  ?Tried statin prior - brain fog.  ? ?Lab Results  ?Component Value Date  ? CHOL 257 (H) 01/08/2022  ? HDL 63  01/08/2022  ? LDLCALC 177 (H) 01/08/2022  ? TRIG 96 01/08/2022  ? CHOLHDL 4.1 01/08/2022  ? ?Lab Results  ?Component Value Date  ? ALT 33 (H) 01/08/2022  ? AST 25 01/08/2022  ? ALKPHOS 68 01/08/2022  ? BILITOT 0.4 01/08/2022  ? ? ? ?Depression screen [facility] 2/9 01/30/2022 05/28/2021 04/02/2021 01/03/2021 08/27/2020  ?Decreased Interest 0 1 0 0 0  ?Down, Depressed, Hopeless 0 1 0 0 0  ?PHQ - 2 Score 0 2 0 0 0  ?Altered sleeping 2 0 - - -  ?Tired, decreased energy 2 1 - - -  ?Change in appetite 1 1 - - -  ?Feeling bad or failure about yourself  0 1 - - -  ?Trouble concentrating 0 0 - - -  ?Moving slowly or fidgety/restless 0 0 - - -  ?Suicidal thoughts 0 0 - - -  ?PHQ-9 Score 5 5 - - -  ?Difficult doing work/chores - Not difficult at all - - -  ?Denies depression. Sleep issues at times. No recent changes.  ? ?Cancer screening: ?Colon: no FH of colon CA. Last cologuard 06/30/17.  Screening options with colonoscopy versus Cologuard discussed. Discussed timing of repeat testing intervals if normal, as well as potential need for diagnostic Colonoscopy if  positive Cologuard. Understanding expressed, and chose Cologuard.  ?Mammogram ordered today.  ?Pap 09/18/2019 with GYN - [place].  ? ?Immunization History  ?Administered Date(s) Administered  ? Influenza, Seasonal, Injecte, Preservative Fre 12/18/2012  ? Influenza,inj,Quad PF,6+ Mos 08/05/2014, 09/16/2017, 09/09/2018, 09/29/2019, 08/27/2020  ? PFIZER(Purple Top)SARS-COV-2 Vaccination 01/15/2020, 02/12/2020  ? Tdap 06/16/2009, 06/28/2019  ?First Shingrix given today.  ? ?No results found. ?Optho - within last year.  ? ?Dental every 6 months.  ? ?Alcohol: glass wine per night.  ? ?Tobacco none ? ?Exercise walking at least 5 d/week, working on getting into some strength training. Work has been time consuming.  ? ?History ?Patient Active Problem List  ? Diagnosis Date Noted  ? Vitamin D deficiency 07/22/2021  ? Elevated ALT measurement 04/04/2020  ? Pre-diabetes 06/09/2017  ?  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 Hyperlipidemia 10/21/2016  ? Obesity 02/15/2014  ? 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 Hypothyroid 06/10/2012  ? Essential hypertension 06/10/2012  ? ?Past Medical History:  ?Diagnosis Date  ? Allergy   ? dust mite allergies; Zyrtec.  ? Anemia   ? Back pain   ? Blood transfusion without reported diagnosis   ? post-operatively in 20s after L1 fracture with spinal cord injury after horseback riding.  ? Chronic kidney disease   ? Tuberous Sclerosis; [facility] at [facility].  ? Edema of both lower extremities   ? Hyperlipidemia   ? Hypertension   ? Hypothyroidism   ? Lower back pain   ? Pre-diabetes   ? Thyroid disease   ? Vitamin D deficiency   ? ?Past Surgical History:  ?Procedure Laterality Date  ? ABDOMINAL HYSTERECTOMY    ? DUB; ovaries remaining.  ? CESAREAN SECTION    ? MENISCUS REPAIR    ? Sleep Study  12/17/2010  ? No sleep disordered breathing or periodic limb mvmets.  Excess light sleep.  ? SPINE SURGERY    ? L1 fracture with spinal cord injury with horseback riding.  ? TUBAL LIGATION    ? ?Allergies  ?Allergen Reactions  ? Saxenda [Liraglutide -Weight Management] Nausea And Vomiting  ?  With borderline high lipase - concern for possible pancreatitis.   ? Sulfa Antibiotics Hives  ? Lipitor [Atorvastatin] Other (See Comments)  ?  Fatigue and confusion  ? ?Prior to Admission medications   ?Medication Sig Start Date End Date Taking? Authorizing Provider  ?enalapril (VASOTEC) 20 MG tablet Take 1 tablet (20 mg total) by mouth daily. 01/08/22  Yes Danford, Orpha Bur D, NP  ?levothyroxine (SYNTHROID) 100 MCG tablet Take 1 tablet (100 mcg total) by mouth daily before breakfast. 04/02/21  Yes [name], MD  ?meloxicam (MOBIC) 7.5 MG tablet Take 1 tablet (7.5 mg total) by mouth daily as needed for pain. 05/29/21  Yes [name], MD  ?metFORMIN (GLUCOPHAGE) 500 MG tablet 1/2 tab with lunch for one week, increase to full tab with lunch week  two-hold at this dose. 01/19/22  Yes Danford, Jinny Blossom, NP  ?Multiple Vitamins-Minerals (CENTRUM ADULTS PO) Take by mouth.   Yes [provider]  ? ?Social History  ? ?Socioeconomic History  ? Marital status: Married  ?  Spouse name: Burnis Kingfisher  ? Number of children: Not on file  ? Years of education: Not on file  ? Highest education level: Not on file  ?Occupational History  ? Occupation: Tourist information centre manager  ?Tobacco Use  ? Smoking status: Never  ? Smokeless tobacco: Never  ?Vaping  Use  ? Vaping Use: Never used  ?Substance and Sexual Activity  ? Alcohol use: Yes  ?  Comment: occ  ? Drug use: No  ? Sexual activity: Yes  ?  Birth control/protection: None  ?Other Topics Concern  ? Not on file  ?Social History Narrative  ? Marital status: married x 20+ years; happily married.  ?    Children: 62 yo son; no grandchildren  ?    Lives: with husband  ?    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  ?    Tobacco; none  ?    Alcohol:  Socially  ?    Drugs: none  ?    Exercise: sporadic  ? ?Social Determinants of Health  ? ?Financial Resource Strain: Not on file  ?Food Insecurity: Not on file  ?Transportation Needs: Not on file  ?Physical Activity: Not on file  ?Stress: Not on file  ?Social Connections: Not on file  ?Intimate Partner Violence: Not on file  ? ? ?Review of Systems ?13 point review of systems per patient health survey noted.  Negative other than as indicated above or in HPI.  ? ? ?Objective:  ? ?Vitals:  ? 01/30/22 0926  ?BP: 128/72  ?Pulse: 73  ?Resp: 16  ?Temp: 98.1 ?F (36.7 ?C)  ?TempSrc: Temporal  ?SpO2: 96%  ?Weight: 158 lb (71.7 kg)  ?Height: 5' (1.524 m)  ? ? ? ?Physical Exam ?Constitutional:   ?   Appearance: She is well-developed.  ?HENT:  ?   Head: Normocephalic and atraumatic.  ?   Right Ear: External ear normal.  ?   Left Ear: External ear normal.  ?Eyes:  ?   Conjunctiva/sclera: Conjunctivae normal.  ?   Pupils: Pupils are equal, round, and reactive to light.  ?Neck:  ?   Thyroid:  No thyromegaly.  ?Cardiovascular:  ?   Rate and Rhythm: Normal rate and regular rhythm.  ?   Heart sounds: Normal heart sounds. No murmur heard. ?Pulmonary:  ?   Effort: Pulmonary effort is normal. No respir

## 2022-01-30 NOTE — Patient Instructions (Addendum)
TSH borderline low. May need to decrease dose, can recheck levels in 1 month. If tsh still low, can decrease synthroid at that time. [facility] be checked at [facility] if needed. 2nd shingles vaccine in 2- 6 months.  ? ?Recheck 1 month to discuss cholesterol and coronary calcium scoring results.  I did place the order for that test.  Cologuard was also ordered.  Thanks for coming in today and please let me know if there are questions. ? ?Preventive Care 62-29 Years Old, Female ?Preventive care refers to lifestyle choices and visits with your health care provider that can promote health and wellness. Preventive care visits are also called wellness exams. ?What can I expect for my preventive care visit? ?Counseling ?Your health care provider may ask you questions about your: ?Medical history, including: ?Past medical problems. ?Family medical history. ?Pregnancy history. ?Current health, including: ?Menstrual cycle. ?Method of birth control. ?Emotional well-being. ?Home life and relationship well-being. ?Sexual activity and sexual health. ?Lifestyle, including: ?Alcohol, nicotine or tobacco, and drug use. ?Access to firearms. ?Diet, exercise, and sleep habits. ?Work and work Statistician. ?Sunscreen use. ?Safety issues such as seatbelt and bike helmet use. ?Physical exam ?Your health care provider will check your: ?Height and weight. These may be used to calculate your BMI (body mass index). BMI is a measurement that tells if you are at a healthy weight. ?Waist circumference. This measures the distance around your waistline. This measurement also tells if you are at a healthy weight and may help predict your risk of certain diseases, such as type 2 diabetes and high blood pressure. ?Heart rate and blood pressure. ?Body temperature. ?Skin for abnormal spots. ?What immunizations do I need? ?Vaccines are usually given at various ages, according to a schedule. Your health care provider will recommend vaccines  for you based on your age, medical history, and lifestyle or other factors, such as travel or where you work. ?What tests do I need? ?Screening ?Your health care provider may recommend screening tests for certain conditions. This may include: ?Lipid and cholesterol levels. ?Diabetes screening. This is done by checking your blood sugar (glucose) after you have not eaten for a while (fasting). ?Pelvic exam and Pap test. ?Hepatitis B test. ?Hepatitis C test. ?HIV (human immunodeficiency virus) test. ?STI (sexually transmitted infection) testing, if you are at risk. ?Lung cancer screening. ?Colorectal cancer screening. ?Mammogram. Talk with your health care provider about when you should start having regular mammograms. This may depend on whether you have a family history of breast cancer. ?BRCA-related cancer screening. This may be done if you have a family history of breast, ovarian, tubal, or peritoneal cancers. ?Bone density scan. This is done to screen for osteoporosis. ?Talk with your health care provider about your test results, treatment options, and if necessary, the need for more tests. ?Follow these instructions at home: ?Eating and drinking ? ?Eat a diet that includes fresh fruits and vegetables, whole grains, lean protein, and low-fat dairy products. ?Take vitamin and mineral supplements as recommended by your health care provider. ?Do not drink alcohol if: ?Your health care provider tells you not to drink. ?You are pregnant, may be pregnant, or are planning to become pregnant. ?If you drink alcohol: ?Limit how much you have to 0-1 drink a day. ?Know how much alcohol is in your drink. In the [place] one drink equals one 12 oz bottle of beer (355 mL), one 5 oz glass of wine (148 mL), or one 1? oz glass of hard liquor (  44 mL). ?Lifestyle ?Brush your teeth every morning and night with fluoride toothpaste. Floss one time each day. ?Exercise for at least 30 minutes 5 or more days each week. ?Do not use any  products that contain nicotine or tobacco. These products include cigarettes, chewing tobacco, and vaping devices, such as e-cigarettes. If you need help quitting, ask your health care provider. ?Do not use drugs. ?If you are sexually active, practice safe sex. Use a condom or other form of protection to prevent STIs. ?If you do not wish to become pregnant, use a form of birth control. If you plan to become pregnant, see your health care provider for a prepregnancy visit. ?Take aspirin only as told by your health care provider. Make sure that you understand how much to take and what form to take. Work with your health care provider to find out whether it is safe and beneficial for you to take aspirin daily. ?Find healthy ways to manage stress, such as: ?Meditation, yoga, or listening to music. ?Journaling. ?Talking to a trusted person. ?Spending time with friends and family. ?Minimize exposure to UV radiation to reduce your risk of skin cancer. ?Safety ?Always wear your seat belt while driving or riding in a vehicle. ?Do not drive: ?If you have been drinking alcohol. Do not ride with someone who has been drinking. ?When you are tired or distracted. ?While texting. ?If you have been using any mind-altering substances or drugs. ?Wear a helmet and other protective equipment during sports activities. ?If you have firearms in your house, make sure you follow all gun safety procedures. ?Seek help if you have been physically or sexually abused. ?What's next? ?Visit your health care provider once a year for an annual wellness visit. ?Ask your health care provider how often you should have your eyes and teeth checked. ?Stay up to date on all vaccines. ?This information is not intended to replace advice given to you by your health care provider. Make sure you discuss any questions you have with your health care provider. ?Document Revised: 04/30/2021 Document Reviewed: 04/30/2021 ?Elsevier Patient Education ? 2022 [place]. ? ? ? ?

## 2022-02-02 ENCOUNTER — Encounter (INDEPENDENT_AMBULATORY_CARE_PROVIDER_SITE_OTHER): Payer: Self-pay | Admitting: Adult Health

## 2022-02-02 ENCOUNTER — Ambulatory Visit (INDEPENDENT_AMBULATORY_CARE_PROVIDER_SITE_OTHER): Payer: BC Managed Care – PPO | Admitting: Adult Health

## 2022-02-02 VITALS — BP 130/77 | HR 63 | Temp 97.7°F | Ht 60.0 in | Wt 151.0 lb

## 2022-02-02 DIAGNOSIS — E782 Mixed hyperlipidemia: Secondary | ICD-10-CM | POA: Diagnosis not present

## 2022-02-02 DIAGNOSIS — R7303 Prediabetes: Secondary | ICD-10-CM

## 2022-02-02 DIAGNOSIS — E039 Hypothyroidism, unspecified: Secondary | ICD-10-CM | POA: Diagnosis not present

## 2022-02-02 DIAGNOSIS — E669 Obesity, unspecified: Secondary | ICD-10-CM

## 2022-02-02 DIAGNOSIS — Z6829 Body mass index (BMI) 29.0-29.9, adult: Secondary | ICD-10-CM

## 2022-02-02 MED ORDER — METFORMIN HCL 500 MG PO TABS: ORAL_TABLET | ORAL | 0 refills | Status: DC

## 2022-02-02 NOTE — Progress Notes (Signed)
? ? ? ?Chief Complaint:  ? ?OBESITY ?[name] is here to discuss her progress with her obesity treatment plan along with follow-up of her obesity related diagnoses. [name] is on keeping a food journal and adhering to recommended goals of 1100-1400 calories and 80 grams of protein and states she is following her eating plan approximately 50% of the time. [name] states she is walking/strength training for 20 minutes 4 times per week. ? ?Today's visit was #: 19 ?Starting weight: 183 lbs ?Starting date: 05/28/2021 ?Today's weight: 151 lbs ?Today's date: 02/02/2022 ?Total lbs lost to date: 32 lbs ?Total lbs lost since last in-office visit: 3 lbs ? ?Interim History:  ?[name] had her annual physical with Dr. Carlota Raspberry at [facility] on 01/30/2022. ?Worsening HLD- CT cardiac scoring scheduled on 03/16/2022. ?She has previously been statin intolerant. ? ?She has tolerated the initiation of Metformin therapy. ?She has titrated up to full tablet of Metformin 500mg  QD at lunch. ? ?Subjective:  ? ?1. Pre-diabetes ?[name] was restarted on metformin 500 mg - 01/16/2022. ?Has titrated up to one full tablet at lunch. ?She denies GI upset.   ?Experiencing late evening polyphagia. ? ?2. Mixed hyperlipidemia ?On 01/30/2022 - PCP ordered CT cardiac scoring. ?Per patient - atorvastatin caused significant brain fog. ?Father - resistant hyperlipidemia.   ?She denies acute cardiac symptoms. ? ?3. Hypothyroidism, unspecified type ?She is on levothyroxine 100 mg daily - managed by PCP. ?Per patient, she has been on various doses of  levothyroxine 100-112 mcg daily for the last 10 years. ?She denies new hot or cold intolerance.  ?She denies hair or skin changes, heart palpitations or new fatigue.  ? ?Assessment/Plan:  ? ?1. Pre-diabetes ?Refill metformin 500 mg at dinner. ? ?- Refill metFORMIN (GLUCOPHAGE) 500 MG tablet; 1 tab with Dinner  Dispense: 30 tablet; Refill: 0 ? ?2. Mixed hyperlipidemia ?Complete CT cardiac scoring on 03/16/2022 - she will  follow-up with her PCP after imaging. ? ?3. Hypothyroidism, unspecified type ?Check thyroid panel in 4 weeks. ? ?4. Obesity with current BMI 29.6 ? ?[name] is currently in the action stage of change. As such, her goal is to continue with weight loss efforts. She has agreed to keeping a food journal and adhering to recommended goals of 1100-1400 calories and 80 grams of protein.  ? ?Exercise goals:  As is. ? ?Behavioral modification strategies: increasing lean protein intake, decreasing simple carbohydrates, meal planning and cooking strategies, keeping healthy foods in the home, and planning for success. ? ?[name] has agreed to follow-up with our clinic in 2 weeks. She was informed of the importance of frequent follow-up visits to maximize her success with intensive lifestyle modifications for her multiple health conditions.  ? ?Objective:  ? ?Blood pressure 130/77, pulse 63, temperature 97.7 ?F (36.5 ?C), height 5' (1.524 m), weight 151 lb (68.5 kg), SpO2 100 %. ?Body mass index is 29.49 kg/m?. ? ?General: Cooperative, alert, well developed, in no acute distress. ?HEENT: Conjunctivae and lids unremarkable. ?Cardiovascular: Regular rhythm.  ?Lungs: Normal work of breathing. ?Neurologic: No focal deficits.  ? ?Lab Results  ?Component Value Date  ? CREATININE 0.79 01/08/2022  ? BUN 20 01/08/2022  ? NA 142 01/08/2022  ? K 4.8 01/08/2022  ? CL 104 01/08/2022  ? CO2 23 01/08/2022  ? ?Lab Results  ?Component Value Date  ? ALT 33 (H) 01/08/2022  ? AST 25 01/08/2022  ? ALKPHOS 68 01/08/2022  ? BILITOT 0.4 01/08/2022  ? ?Lab Results  ?Component Value Date  ? HGBA1C 5.7 (  H) 01/08/2022  ? HGBA1C 5.6 07/22/2021  ? HGBA1C 5.7 (H) 01/03/2021  ? HGBA1C 5.7 (H) 06/03/2020  ? HGBA1C 5.7 (H) 04/03/2020  ? ?Lab Results  ?Component Value Date  ? INSULIN 7.7 01/08/2022  ? INSULIN 12.0 04/03/2020  ? ?Lab Results  ?Component Value Date  ? TSH 0.430 (L) 01/08/2022  ? ?Lab Results  ?Component Value Date  ? CHOL 257 (H) 01/08/2022  ? HDL  63 01/08/2022  ? LDLCALC 177 (H) 01/08/2022  ? TRIG 96 01/08/2022  ? CHOLHDL 4.1 01/08/2022  ? ?Lab Results  ?Component Value Date  ? VD25OH 31.5 01/08/2022  ? VD25OH 57.0 07/22/2021  ? VD25OH 18.9 (L) 01/03/2021  ? ?Lab Results  ?Component Value Date  ? WBC 6.3 06/03/2020  ? HGB 14.6 06/03/2020  ? HCT 43.4 06/03/2020  ? MCV 97 06/03/2020  ? PLT 314 06/03/2020  ? ?Attestation Statements:  ? ?Reviewed by clinician on day of visit: allergies, medications, problem list, medical history, surgical history, family history, social history, and previous encounter notes. ? ?I, Water quality scientist, CMA, am acting as Location manager for Mina Marble, NP. ? ?I have reviewed the above documentation for accuracy and completeness, and I agree with the above. -  Majorie Santee d. Makylee Sanborn, NP-C ?

## 2022-02-12 ENCOUNTER — Telehealth: Payer: Self-pay

## 2022-02-12 DIAGNOSIS — E039 Hypothyroidism, unspecified: Secondary | ICD-10-CM

## 2022-02-12 NOTE — Telephone Encounter (Signed)
Patient states that [facility] and do blood owrk but was unable to get an appointment within the preferred date range. Patient is asking if she can just come here and do blood work.  ?

## 2022-02-12 NOTE — Addendum Note (Signed)
Addended by: Merri Ray R on: 02/12/2022 06:52 PM ? ? Modules accepted: Orders ? ?

## 2022-02-12 NOTE — Telephone Encounter (Signed)
Sure.  Have labs done here with a lab only visit or through [facility].  TSH and free T4 ordered as future labs.  Thanks ?

## 2022-02-12 NOTE — Telephone Encounter (Signed)
Pt asking to have labs performed here, noted in assessment and Plan that she was to [facility], is this okay to be performed here? ? ?TSH only? ? ?

## 2022-02-13 NOTE — Telephone Encounter (Signed)
Set up lab visit for 4/17 and moved appt to 03/05/22 for patients convenience  ?

## 2022-02-16 ENCOUNTER — Ambulatory Visit (INDEPENDENT_AMBULATORY_CARE_PROVIDER_SITE_OTHER): Payer: BC Managed Care – PPO | Admitting: Adult Health

## 2022-02-16 ENCOUNTER — Encounter (INDEPENDENT_AMBULATORY_CARE_PROVIDER_SITE_OTHER): Payer: Self-pay

## 2022-02-20 LAB — COLOGUARD: COLOGUARD: NEGATIVE

## 2022-02-27 ENCOUNTER — Ambulatory Visit
Admission: RE | Admit: 2022-02-27 | Discharge: 2022-02-27 | Disposition: A | Discharge status: Home / Self Care | Payer: BC Managed Care – PPO | Source: Ambulatory Visit | Attending: Family Medicine | Admitting: Family Medicine

## 2022-02-27 DIAGNOSIS — Z1231 Encounter for screening mammogram for malignant neoplasm of breast: Secondary | ICD-10-CM

## 2022-03-02 ENCOUNTER — Ambulatory Visit: Payer: BC Managed Care – PPO | Admitting: Family Medicine

## 2022-03-02 ENCOUNTER — Other Ambulatory Visit: Payer: BC Managed Care – PPO

## 2022-03-02 DIAGNOSIS — E039 Hypothyroidism, unspecified: Secondary | ICD-10-CM

## 2022-03-03 LAB — TSH+FREE T4: TSH W/REFLEX TO FT4: 0.32 mIU/L — ABNORMAL LOW (ref 0.40–4.50)

## 2022-03-03 LAB — T4, FREE: Free T4: 1.6 ng/dL (ref 0.8–1.8)

## 2022-03-05 ENCOUNTER — Ambulatory Visit (INDEPENDENT_AMBULATORY_CARE_PROVIDER_SITE_OTHER): Payer: BC Managed Care – PPO | Admitting: Family Medicine

## 2022-03-05 VITALS — BP 118/72 | HR 71 | Temp 98.2°F | Resp 17 | Ht 60.0 in | Wt 155.2 lb

## 2022-03-05 DIAGNOSIS — G4453 Primary thunderclap headache: Secondary | ICD-10-CM | POA: Diagnosis not present

## 2022-03-05 DIAGNOSIS — E782 Mixed hyperlipidemia: Secondary | ICD-10-CM | POA: Diagnosis not present

## 2022-03-05 DIAGNOSIS — E039 Hypothyroidism, unspecified: Secondary | ICD-10-CM

## 2022-03-05 NOTE — Patient Instructions (Addendum)
No change in synthroid for now. Lab only visit when you return from your trip.  ?CT head ordered. Depending on results can discuss headache with neuro as MRA or CTA may be next study.  ?Return to the clinic or go to the nearest emergency room if any of your symptoms worsen or new symptoms occur. ? ?

## 2022-03-05 NOTE — Progress Notes (Signed)
? ?Subjective:  ?Patient ID: [name], female    DOB: 1960/12/07  Age: 61 y.o. MRN: [id number] ? ?CC:  ?Chief Complaint  ?Patient presents with  ? Hypothyroidism  ?  Pt had abnormal lab and was advised follow up 1 month   ? Hyperlipidemia  ?  Was advised to f/u 1 mon HLD recent lab work   ? Results  ?  Discuss results of CT   ? ? ?HPI ?[name] presents for  ? ?Hypothyroidism: ?Lab Results  ?Component Value Date  ? TSH 0.430 (L) 01/08/2022  ?Taking medication daily. Same dose synthroid qd.  ?Free t4 1.6 on 4/17.  ?TSH 0.32 on 4/17.  ?Prior  TSH 0.43, free t4 elevated at 1.98 on 01/08/22.  ?Will be traveling to [place] for 3 weeks - leaving next week. Prefers not to change at this time.  ?Only palpitation with increased stress only, not otherwise. Not feeling symptoms of hyperthyroid as she has experienced in past.  ?Taking medication daily.  ?No new hot or cold intolerance. No new hair or skin changes or new fatigue. No new weight changes.  ? ?Hyperlipidemia: ?Coronary calcium scoring on 03/17/22. No current statin.  ?The 10-year ASCVD risk score (Arnett DK, et al., 2019) is: 4.6% ?  Values used to calculate the score: ?    Age: 71 years ?    Sex: Female ?    Is Non-Hispanic African American: No ?    Diabetic: No ?    Tobacco smoker: No ?    Systolic Blood Pressure: 118 mmHg ?    Is BP treated: Yes ?    HDL Cholesterol: 63 mg/dL ?    Total Cholesterol: 257 mg/dL ? ?Lab Results  ?Component Value Date  ? CHOL 257 (H) 01/08/2022  ? HDL 63 01/08/2022  ? LDLCALC 177 (H) 01/08/2022  ? TRIG 96 01/08/2022  ? CHOLHDL 4.1 01/08/2022  ? ?Lab Results  ?Component Value Date  ? ALT 33 (H) 01/08/2022  ? AST 25 01/08/2022  ? ALKPHOS 68 01/08/2022  ? BILITOT 0.4 01/08/2022  ? ?Headache: ?Bolt of lightning feeling of left scalp - 2 episodes a few weeks ago ( 2nd with a week of first HA, but less severe) initial severe for 20-30s, then resolved. Made eyes water.  no recurrence. no current headache.  ?Hx of tuberous  sclerosis. Sensation on left scalp area - burning sensation. No palpable discomfort. Pressure feeling left side today - but increased stress. Comes and goes. No vision changes/n/v. No focal weakness.not daily pressure.  ?Hx of migraine, less frequent as she has gotten older and different than recent HA, no recent migraine.  ?No recent sinus congestion/pressure.  ?Maternal aunt and grandmother with cerebral aneurysm.  ? ? ?  ?History ?Patient Active Problem List  ? Diagnosis Date Noted  ? Vitamin D deficiency 07/22/2021  ? Elevated ALT measurement 04/04/2020  ? Pre-diabetes 06/09/2017  ? 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 Hyperlipidemia 10/21/2016  ? Obesity 02/15/2014  ? 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 Hypothyroid 06/10/2012  ? Essential hypertension 06/10/2012  ? ?Past Medical History:  ?Diagnosis Date  ? Allergy   ? dust mite allergies; Zyrtec.  ? Anemia   ? Back pain   ? Blood transfusion without reported diagnosis   ? post-operatively in 20s after L1 fracture with spinal cord injury after horseback riding.  ? Chronic kidney disease   ? Tuberous Sclerosis; [facility].  ?  Edema of both lower extremities   ? Hyperlipidemia   ? Hypertension   ? Hypothyroidism   ? Lower back pain   ? Pre-diabetes   ? Thyroid disease   ? Vitamin D deficiency   ? ?Past Surgical History:  ?Procedure Laterality Date  ? ABDOMINAL HYSTERECTOMY    ? DUB; ovaries remaining.  ? CESAREAN SECTION    ? MENISCUS REPAIR    ? Sleep Study  12/17/2010  ? No sleep disordered breathing or periodic limb mvmets.  Excess light sleep.  ? SPINE SURGERY    ? L1 fracture with spinal cord injury with horseback riding.  ? TUBAL LIGATION    ? ?Allergies  ?Allergen Reactions  ? Saxenda [Liraglutide -Weight Management] Nausea And Vomiting  ?  With borderline high lipase - concern for possible pancreatitis.   ? Sulfa Antibiotics Hives  ? Lipitor [Atorvastatin] Other (See Comments)  ?  Fatigue and confusion   ? ?Prior to Admission medications   ?Medication Sig Start Date End Date Taking? Authorizing Provider  ?enalapril (VASOTEC) 20 MG tablet Take 1 tablet (20 mg total) by mouth daily. 01/30/22  Yes Shade FloodGreene, Nicasio Barlowe R, MD  ?levothyroxine (SYNTHROID) 100 MCG tablet Take 1 tablet (100 mcg total) by mouth daily before breakfast. 01/30/22  Yes Shade FloodGreene, Cierah Crader R, MD  ?meloxicam (MOBIC) 7.5 MG tablet Take 1 tablet (7.5 mg total) by mouth daily as needed for pain. 05/29/21  Yes Shade FloodGreene, Viktor Philipp R, MD  ?metFORMIN (GLUCOPHAGE) 500 MG tablet 1 tab with Dinner 02/02/22  Yes Danford, Jinny BlossomKaty D, NP  ?Multiple Vitamins-Minerals (CENTRUM ADULTS PO) Take by mouth.   Yes [provider]  ? ?Social History  ? ?Socioeconomic History  ? Marital status: Married  ?  Spouse name: Burnis KingfisherDavie  ? Number of children: Not on file  ? Years of education: Not on file  ? Highest education level: Not on file  ?Occupational History  ? 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 Smoking status: Never  ? Smokeless tobacco: Never  ?Vaping Use  ? Vaping Use: Never used  ?Substance and Sexual Activity  ? Alcohol use: Yes  ?  Comment: occ  ? Drug use: No  ? Sexual activity: Yes  ?  Birth control/protection: None  ?Other Topics Concern  ? Not on file  ?Social History Narrative  ? Marital status: married x 20+ years; happily married.  ?    Children: 62 yo son; no grandchildren  ?    Lives: with husband  ?    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  ?    Tobacco; none  ?    Alcohol:  Socially  ?    Drugs: none  ?    Exercise: sporadic  ? ?Social Determinants of Health  ? ?Financial Resource Strain: Not on file  ?Food Insecurity: Not on file  ?Transportation Needs: Not on file  ?Physical Activity: Not on file  ?Stress: Not on file  ?Social Connections: Not on file  ?Intimate Partner Violence: Not on file  ? ? ?Review of Systems ? ?Per HPI.  ?Objective:  ? ?Vitals:  ? 03/05/22 1551  ?BP: 118/72  ?Pulse: 71  ?Resp: 17  ?Temp: 98.2 ?F (36.8 ?C)   ?TempSrc: Temporal  ?SpO2: 98%  ?Weight: 155 lb 3.2 oz (70.4 kg)  ?Height: 5' (1.524 m)  ? ? ? ?Physical Exam ?Vitals reviewed.  ?Constitutional:   ?   Appearance: Normal appearance. She is well-developed.  ?HENT:  ?  Head: Normocephalic and atraumatic.  ?Eyes:  ?   Conjunctiva/sclera: Conjunctivae normal.  ?   Pupils: Pupils are equal, round, and reactive to light.  ?Neck:  ?   Vascular: No carotid bruit.  ?Cardiovascular:  ?   Rate and Rhythm: Normal rate and regular rhythm.  ?   Heart sounds: Normal heart sounds.  ?Pulmonary:  ?   Effort: Pulmonary effort is normal.  ?   Breath sounds: Normal breath sounds.  ?Abdominal:  ?   Palpations: Abdomen is soft. There is no pulsatile mass.  ?   Tenderness: There is no abdominal tenderness.  ?Musculoskeletal:  ?   Right lower leg: No edema.  ?   Left lower leg: No edema.  ?Skin: ?   General: Skin is warm and dry.  ?Neurological:  ?   General: No focal deficit present.  ?   Mental Status: She is alert and oriented to person, place, and time.  ?   GCS: GCS eye subscore is 4. GCS verbal subscore is 5. GCS motor subscore is 6.  ?   Cranial Nerves: No cranial nerve deficit or dysarthria.  ?   Motor: No weakness, tremor or pronator drift.  ?   Coordination: Romberg sign negative. Finger-Nose-Finger Test normal.  ?   Gait: Gait is intact.  ?Psychiatric:     ?   Mood and Affect: Mood normal.     ?   Behavior: Behavior normal.  ? ? ? ? ? ?Assessment & Plan:  ?[name] is a 61 y.o. female . ?Thunderclap headache - Plan: CT HEAD WO CONTRAST ( ), CANCELED: CT HEAD WO CONTRAST ( ) ? -Describes symptoms appear to be possible thunderclap headache, and reported 2 episodes within 1 week of each other.  No daily headache or recent symptoms but does still have intermittent pressure/sensation on left side of head.  Nonfocal neurologic exam.  Initially check CT head, then possible MRA, or CTA and likely will discuss with neurology on next steps.  ER precautions  given. ? ?Hypothyroidism, unspecified type - Plan: TSH + free T4 ? -Borderline TSH, free T4.  Based on those readings decided against medication changes for now with repeat testing in the next 1 month, when she returns from [place]

## 2022-03-09 ENCOUNTER — Ambulatory Visit (INDEPENDENT_AMBULATORY_CARE_PROVIDER_SITE_OTHER): Payer: BC Managed Care – PPO | Admitting: Adult Health

## 2022-03-09 ENCOUNTER — Encounter (INDEPENDENT_AMBULATORY_CARE_PROVIDER_SITE_OTHER): Payer: Self-pay | Admitting: Adult Health

## 2022-03-09 VITALS — BP 124/73 | HR 64 | Temp 98.1°F | Ht 60.0 in | Wt 151.0 lb

## 2022-03-09 DIAGNOSIS — E669 Obesity, unspecified: Secondary | ICD-10-CM

## 2022-03-09 DIAGNOSIS — R7303 Prediabetes: Secondary | ICD-10-CM | POA: Diagnosis not present

## 2022-03-09 DIAGNOSIS — Z6829 Body mass index (BMI) 29.0-29.9, adult: Secondary | ICD-10-CM | POA: Diagnosis not present

## 2022-03-09 MED ORDER — METFORMIN HCL 1000 MG PO TABS: ORAL_TABLET | ORAL | 0 refills | Status: DC

## 2022-03-10 NOTE — Telephone Encounter (Signed)
Pt is asking if theres a sooner option than 5/1  ?Am I correct in assuming this was the soonest available?  ?

## 2022-03-13 ENCOUNTER — Encounter: Payer: BC Managed Care – PPO | Admitting: Family Medicine

## 2022-03-16 ENCOUNTER — Ambulatory Visit (INDEPENDENT_AMBULATORY_CARE_PROVIDER_SITE_OTHER)
Admission: RE | Admit: 2022-03-16 | Discharge: 2022-03-16 | Disposition: A | Discharge status: Home / Self Care | Payer: BC Managed Care – PPO | Source: Ambulatory Visit | Attending: Family Medicine | Admitting: Family Medicine

## 2022-03-16 ENCOUNTER — Ambulatory Visit (INDEPENDENT_AMBULATORY_CARE_PROVIDER_SITE_OTHER)
Admission: RE | Admit: 2022-03-16 | Discharge: 2022-03-16 | Disposition: A | Discharge status: Home / Self Care | Payer: Self-pay | Source: Ambulatory Visit | Attending: Family Medicine | Admitting: Family Medicine

## 2022-03-16 DIAGNOSIS — G4453 Primary thunderclap headache: Secondary | ICD-10-CM

## 2022-03-16 DIAGNOSIS — E782 Mixed hyperlipidemia: Secondary | ICD-10-CM

## 2022-03-18 NOTE — Progress Notes (Signed)
? ? ? ?Chief Complaint:  ? ?OBESITY ?[name] is here to discuss her progress with her obesity treatment plan along with follow-up of her obesity related diagnoses. [name] is on keeping a food journal and adhering to recommended goals of 1100-1400 calories and 80 grams of protein and states she is following her eating plan approximately 50% of the time. [name] states she is walking and strength training for 60 minutes 1 times per week. ? ?Today's visit was #: 20 ?Starting weight: 183 lbs ?Starting date: 05/28/2021 ?Today's weight: 151 lbs ?Today's date: 03/09/2022 ?Total lbs lost to date: 32 lbs ?Total lbs lost since last in-office visit: 0 ? ?Interim History:  ?[name] estimates to consume at least 80 grams of protein 1 day.  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  ?Grades are  ?She has a trip to [place] in 3 weeks.  While abroad, her largest meal is at dinner and she has a salad.  ? ?Subjective:  ? ?1. Pre-diabetes ?[name] is currently on Metformin 500 mg every day.  ?She endorses an increase in polyphagia between 3-4 pm daily. She denies gastrointestinal upset.  ?She is unable to tolerate GLP-1 therapy.  ? ?Assessment/Plan:  ? ?1. Pre-diabetes ?We will refill Metformin 1000 mg for 2 months with no refills Good blood sugar control is important to decrease the likelihood of diabetic complications such as nephropathy, neuropathy, limb loss, blindness, coronary artery disease, and death. Intensive lifestyle modification including diet, exercise and weight loss are the first line of treatment for diabetes.  ? ?- metFORMIN (GLUCOPHAGE) 1000 MG tablet; 1 tab with lunch  Dispense: 60 tablet; Refill: 0 ? ?2. Obesity with current BMI 29.6 ?[name] is currently in the action stage of change. As such, her goal is to continue with weight loss efforts. She has agreed to keeping a food journal and adhering to recommended goals of 1100-1400 calories and 80 grams of protein.  ? ?We will check fasting  labs and IC at next office visit.  ?She is aware to arrive 30 mins early and fasting to her next OV. ? ?Exercise goals:  As is.  ? ?Behavioral modification strategies: increasing lean protein intake, decreasing simple carbohydrates, meal planning and cooking strategies, keeping healthy foods in the home, and travel eating strategies. ? ?[name] has agreed to follow-up with our clinic in 8 weeks. She was informed of the importance of frequent follow-up visits to maximize her success with intensive lifestyle modifications for her multiple health conditions.  ? ?Objective:  ? ?Blood pressure 124/73, pulse 64, temperature 98.1 ?F (36.7 ?C), height 5' (1.524 m), weight 151 lb (68.5 kg), SpO2 98 %. ?Body mass index is 29.49 kg/m?. ? ?General: Cooperative, alert, well developed, in no acute distress. ?HEENT: Conjunctivae and lids unremarkable. ?Cardiovascular: Regular rhythm.  ?Lungs: Normal work of breathing. ?Neurologic: No focal deficits.  ? ?Lab Results  ?Component Value Date  ? CREATININE 0.79 01/08/2022  ? BUN 20 01/08/2022  ? NA 142 01/08/2022  ? K 4.8 01/08/2022  ? CL 104 01/08/2022  ? CO2 23 01/08/2022  ? ?Lab Results  ?Component Value Date  ? ALT 33 (H) 01/08/2022  ? AST 25 01/08/2022  ? ALKPHOS 68 01/08/2022  ? BILITOT 0.4 01/08/2022  ? ?Lab Results  ?Component Value Date  ? HGBA1C 5.7 (H) 01/08/2022  ? HGBA1C 5.6 07/22/2021  ? HGBA1C 5.7 (H) 01/03/2021  ? HGBA1C 5.7 (H) 06/03/2020  ? HGBA1C 5.7 (H) 04/03/2020  ? ?Lab Results  ?Component Value  Date  ? INSULIN 7.7 01/08/2022  ? INSULIN 12.0 04/03/2020  ? ?Lab Results  ?Component Value Date  ? TSH 0.430 (L) 01/08/2022  ? ?Lab Results  ?Component Value Date  ? CHOL 257 (H) 01/08/2022  ? HDL 63 01/08/2022  ? LDLCALC 177 (H) 01/08/2022  ? TRIG 96 01/08/2022  ? CHOLHDL 4.1 01/08/2022  ? ?Lab Results  ?Component Value Date  ? VD25OH 31.5 01/08/2022  ? VD25OH 57.0 07/22/2021  ? VD25OH 18.9 (L) 01/03/2021  ? ?Lab Results  ?Component Value Date  ? WBC 6.3 06/03/2020  ?  HGB 14.6 06/03/2020  ? HCT 43.4 06/03/2020  ? MCV 97 06/03/2020  ? PLT 314 06/03/2020  ? ?No results found for: IRON, TIBC, FERRITIN ? ?Attestation Statements:  ? ?Reviewed by clinician on day of visit: allergies, medications, problem list, medical history, surgical history, family history, social history, and previous encounter notes. ? ?I, Jackson Latino, RMA, am acting as Energy manager for William Hamburger, NP. ? ?I have reviewed the above documentation for accuracy and completeness, and I agree with the above. -  Sutton Plake d. Philicia Heyne, NP-C ?

## 2022-03-20 ENCOUNTER — Encounter: Payer: Self-pay | Admitting: Family Medicine

## 2022-03-20 ENCOUNTER — Other Ambulatory Visit: Payer: Self-pay | Admitting: Family Medicine

## 2022-03-20 DIAGNOSIS — Q851 Tuberous sclerosis: Secondary | ICD-10-CM

## 2022-03-20 DIAGNOSIS — G4453 Primary thunderclap headache: Secondary | ICD-10-CM

## 2022-03-20 DIAGNOSIS — Z8249 Family history of ischemic heart disease and other diseases of the circulatory system: Secondary | ICD-10-CM

## 2022-03-20 NOTE — Progress Notes (Signed)
MRI, MRA ordered for thunderclap headache, tuberous sclerosis hx. Attempted call with patient. Left message - will send message by Mychart.  ?

## 2022-03-23 NOTE — Telephone Encounter (Signed)
I cannot find message with results as reported on imaging, please advise if I need to take actions here  ?

## 2022-04-03 ENCOUNTER — Other Ambulatory Visit (INDEPENDENT_AMBULATORY_CARE_PROVIDER_SITE_OTHER): Payer: Self-pay | Admitting: Adult Health

## 2022-04-03 DIAGNOSIS — R7303 Prediabetes: Secondary | ICD-10-CM

## 2022-04-09 ENCOUNTER — Other Ambulatory Visit: Payer: BC Managed Care – PPO

## 2022-04-09 DIAGNOSIS — E039 Hypothyroidism, unspecified: Secondary | ICD-10-CM

## 2022-04-10 ENCOUNTER — Ambulatory Visit (HOSPITAL_BASED_OUTPATIENT_CLINIC_OR_DEPARTMENT_OTHER)
Admission: RE | Admit: 2022-04-10 | Discharge: 2022-04-10 | Disposition: A | Discharge status: Home / Self Care | Payer: BC Managed Care – PPO | Source: Ambulatory Visit | Attending: Family Medicine | Admitting: Family Medicine

## 2022-04-10 DIAGNOSIS — Z8249 Family history of ischemic heart disease and other diseases of the circulatory system: Secondary | ICD-10-CM

## 2022-04-10 DIAGNOSIS — Q851 Tuberous sclerosis: Secondary | ICD-10-CM | POA: Diagnosis present

## 2022-04-10 DIAGNOSIS — G4453 Primary thunderclap headache: Secondary | ICD-10-CM

## 2022-04-10 LAB — TSH+FREE T4: TSH W/REFLEX TO FT4: 1.94 mIU/L (ref 0.40–4.50)

## 2022-04-10 MED ORDER — GADOBUTROL 1 MMOL/ML IV SOLN
6.8000 mL | Freq: Once | INTRAVENOUS | Status: AC | PRN
  Administered 2022-04-10: 6.8 mL via INTRAVENOUS
  Filled 2022-04-10: qty 7.5

## 2022-04-13 ENCOUNTER — Encounter: Payer: Self-pay | Admitting: Family Medicine

## 2022-04-16 ENCOUNTER — Ambulatory Visit: Payer: BC Managed Care – PPO | Admitting: Family Medicine

## 2022-04-20 ENCOUNTER — Encounter (INDEPENDENT_AMBULATORY_CARE_PROVIDER_SITE_OTHER): Payer: Self-pay | Admitting: Adult Health

## 2022-04-20 ENCOUNTER — Ambulatory Visit (INDEPENDENT_AMBULATORY_CARE_PROVIDER_SITE_OTHER): Payer: BC Managed Care – PPO | Admitting: Adult Health

## 2022-04-20 VITALS — BP 118/78 | HR 84 | Temp 98.0°F | Ht 60.0 in | Wt 154.0 lb

## 2022-04-20 DIAGNOSIS — R0602 Shortness of breath: Secondary | ICD-10-CM

## 2022-04-20 DIAGNOSIS — E782 Mixed hyperlipidemia: Secondary | ICD-10-CM | POA: Diagnosis not present

## 2022-04-20 DIAGNOSIS — Z6830 Body mass index (BMI) 30.0-30.9, adult: Secondary | ICD-10-CM

## 2022-04-20 DIAGNOSIS — E559 Vitamin D deficiency, unspecified: Secondary | ICD-10-CM

## 2022-04-20 DIAGNOSIS — Z9189 Other specified personal risk factors, not elsewhere classified: Secondary | ICD-10-CM

## 2022-04-20 DIAGNOSIS — E669 Obesity, unspecified: Secondary | ICD-10-CM

## 2022-04-20 DIAGNOSIS — Z7984 Long term (current) use of oral hypoglycemic drugs: Secondary | ICD-10-CM

## 2022-04-20 DIAGNOSIS — G4453 Primary thunderclap headache: Secondary | ICD-10-CM

## 2022-04-20 DIAGNOSIS — R7303 Prediabetes: Secondary | ICD-10-CM | POA: Diagnosis not present

## 2022-04-20 MED ORDER — METFORMIN HCL 1000 MG PO TABS: ORAL_TABLET | ORAL | 0 refills | Status: DC

## 2022-04-21 LAB — COMPREHENSIVE METABOLIC PANEL
ALT: 18 IU/L (ref 0–32)
AST: 17 IU/L (ref 0–40)
Albumin/Globulin Ratio: 1.9 (ref 1.2–2.2)
Albumin: 5 g/dL — ABNORMAL HIGH (ref 3.8–4.8)
Alkaline Phosphatase: 71 IU/L (ref 44–121)
BUN/Creatinine Ratio: 25 (ref 12–28)
BUN: 20 mg/dL (ref 8–27)
Bilirubin Total: 0.5 mg/dL (ref 0.0–1.2)
CO2: 24 mmol/L (ref 20–29)
Calcium: 10 mg/dL (ref 8.7–10.3)
Chloride: 101 mmol/L (ref 96–106)
Creatinine, Ser: 0.8 mg/dL (ref 0.57–1.00)
Globulin, Total: 2.7 g/dL (ref 1.5–4.5)
Glucose: 84 mg/dL (ref 70–99)
Potassium: 4.2 mmol/L (ref 3.5–5.2)
Sodium: 142 mmol/L (ref 134–144)
Total Protein: 7.7 g/dL (ref 6.0–8.5)
eGFR: 84 mL/min/{1.73_m2} (ref >59)

## 2022-04-21 LAB — HEMOGLOBIN A1C
Est. average glucose Bld gHb Est-mCnc: 114 mg/dL
Hgb A1c MFr Bld: 5.6 % (ref 4.8–5.6)

## 2022-04-21 LAB — INSULIN, RANDOM: INSULIN: 7.9 u[IU]/mL (ref 2.6–24.9)

## 2022-04-21 LAB — VITAMIN D 25 HYDROXY (VIT D DEFICIENCY, FRACTURES): Vit D, 25-Hydroxy: 37.1 ng/mL (ref 30.0–100.0)

## 2022-04-27 NOTE — Progress Notes (Signed)
Chief Complaint:   OBESITY [name] is here to discuss her progress with her obesity treatment plan along with follow-up of her obesity related diagnoses. [name] is on keeping a food journal and adhering to recommended goals of 1100-1400 calories and 80 gms protein and states she is following her eating plan approximately 50% of the time. [name] states she is gardening. Today's visit was #: 21 Starting weight: 183 lbs Starting date: 05/28/2021 Today's weight: 154 lbs Today's date: 04/20/22 Total lbs lost to date: 29 lbs Total lbs lost since last in-office visit: +3  Interim History:  04/03/2020 RMR 1617, today RMR 1526. Metabolism slightly decreased by 91 cal.  She enjoyed a 3-week trip to [place]-increased alcohol, increased carb intake.  Bioimpedance +2.2 pounds of muscle mass.                         +0.8 pounds of adipose mass.  Subjective:   1. Pre-diabetes She is on metformin 1000 mg once daily-at lunch. Dose increased from [phone] mg 03/10/2023 due to afternoon polyphagia. She denies GI upset. Unable to tolerate GLP-1 therapy, SE N/V.  2. Thunderclap headache Intense headache pain that lasted 30 to 60 seconds approximately 8 weeks ago.   She experienced another headache, however less severe.   Imaging 04/10/2022  MR BRAIN W WO IMPRESSION: 1. Partially empty sella, often an incidental finding though can be seen with idiopathic intracranial hypertension. 2. Otherwise unremarkable appearance of the brain. 3. Negative head MRA.   MR ANGIO HEAD WO CONTRAST IMPRESSION: 1. Partially empty sella, often an incidental finding though can be seen with idiopathic intracranial hypertension. 2. Otherwise unremarkable appearance of the brain. 3. Negative head MRA.  3. SOB (shortness of breath) on exertion She reports dyspnea with extreme exertion, however denies CP with exertion.  4. Mixed hyperlipidemia Total/LDL remain above goal, however CT cardiac scoring  03/16/2022: IMPRESSION: Coronary calcium score of 0 Agatston units. This suggests low risk for future cardiac events.  5. Vitamin D deficiency She is on OTC multivitamin once daily.  6. At risk for diarrhea Related to metformin therapy for prediabetes.  Assessment/Plan:   1. Pre-diabetes Refill - metFORMIN (GLUCOPHAGE) 1000 MG tablet; 1 tab with lunch  Dispense: 60 tablet; Refill: 0 Check labs. - Hemoglobin A1c - Insulin, random  2. Thunderclap headache Follow-up with Dr. Haze Justin.  3. SOB (shortness of breath) on exertion Check incentive calorimetry.  4. Mixed hyperlipidemia Check labs. - Comprehensive metabolic panel. Decrease saturated fat, increase regular exercise.  5. Vitamin D deficiency Check labs. - VITAMIN D 25 Hydroxy (Vit-D Deficiency, Fractures)  6. At risk for diarrhea [name] was given approximately 15 minutes of diarrhea prevention counseling today. She is 61 y.o. female and has risk factors for diarrhea including medications and changes in diet. We discussed intensive lifestyle modifications today with an emphasis on specific weight loss instructions including dietary strategies.   Repetitive spaced learning was employed today to elicit superior memory formation and behavioral change.   7. Obesity with current BMI 30.2 [name] is currently in the action stage of change. As such, her goal is to continue with weight loss efforts. She has agreed to keeping a food journal and adhering to recommended goals of 1100-1200 calories and 85 gms protein.   Exercise goals: as is.  Behavioral modification strategies: increasing lean protein intake, decreasing simple carbohydrates, meal planning and cooking strategies, keeping healthy foods in the home, and planning for success.  [name]  has agreed to follow-up with our clinic in 6 weeks. She was informed of the importance of frequent follow-up visits to maximize her success with intensive lifestyle modifications  for her multiple health conditions.   [name] was informed we would discuss her lab results at her next visit unless there is a critical issue that needs to be addressed sooner. [name] agreed to keep her next visit at the agreed upon time to discuss these results.  Objective:   Blood pressure 118/78, pulse 84, temperature 98 F (36.7 C), height 5' (1.524 m), weight 154 lb (69.9 kg), SpO2 98 %. Body mass index is 30.08 kg/m.  General: Cooperative, alert, well developed, in no acute distress. HEENT: Conjunctivae and lids unremarkable. Cardiovascular: Regular rhythm.  Lungs: Normal work of breathing. Neurologic: No focal deficits.   Lab Results  Component Value Date   CREATININE 0.80 04/20/2022   BUN 20 04/20/2022   NA 142 04/20/2022   K 4.2 04/20/2022   CL 101 04/20/2022   CO2 24 04/20/2022   Lab Results  Component Value Date   ALT 18 04/20/2022   AST 17 04/20/2022   ALKPHOS 71 04/20/2022   BILITOT 0.5 04/20/2022   Lab Results  Component Value Date   HGBA1C 5.6 04/20/2022   HGBA1C 5.7 (H) 01/08/2022   HGBA1C 5.6 07/22/2021   HGBA1C 5.7 (H) 01/03/2021   HGBA1C 5.7 (H) 06/03/2020   Lab Results  Component Value Date   INSULIN 7.9 04/20/2022   INSULIN 7.7 01/08/2022   INSULIN 12.0 04/03/2020   Lab Results  Component Value Date   TSH 0.430 (L) 01/08/2022   Lab Results  Component Value Date   CHOL 257 (H) 01/08/2022   HDL 63 01/08/2022   LDLCALC 177 (H) 01/08/2022   TRIG 96 01/08/2022   CHOLHDL 4.1 01/08/2022   Lab Results  Component Value Date   VD25OH 37.1 04/20/2022   VD25OH 31.5 01/08/2022   VD25OH 57.0 07/22/2021   Lab Results  Component Value Date   WBC 6.3 06/03/2020   HGB 14.6 06/03/2020   HCT 43.4 06/03/2020   MCV 97 06/03/2020   PLT 314 06/03/2020   No results found for: "IRON", "TIBC", "FERRITIN"   Attestation Statements:   Reviewed by clinician on day of visit: allergies, medications, problem list, medical history, surgical history,  family history, social history, and previous encounter notes.  I, Jesse Sans, FNP, am acting as Energy manager for William Hamburger, NP.  I have reviewed the above documentation for accuracy and completeness, and I agree with the above. -  Baila Rouse d. Drewey Begue, NP-C

## 2022-05-08 ENCOUNTER — Ambulatory Visit: Payer: BC Managed Care – PPO | Admitting: Family Medicine

## 2022-05-08 ENCOUNTER — Encounter: Payer: Self-pay | Admitting: Family Medicine

## 2022-05-08 VITALS — BP 118/70 | HR 60 | Temp 98.2°F | Resp 15 | Ht 60.0 in | Wt 159.8 lb

## 2022-05-08 DIAGNOSIS — E039 Hypothyroidism, unspecified: Secondary | ICD-10-CM

## 2022-05-08 DIAGNOSIS — R9089 Other abnormal findings on diagnostic imaging of central nervous system: Secondary | ICD-10-CM | POA: Diagnosis not present

## 2022-05-08 DIAGNOSIS — G4453 Primary thunderclap headache: Secondary | ICD-10-CM | POA: Diagnosis not present

## 2022-05-08 DIAGNOSIS — I10 Essential (primary) hypertension: Secondary | ICD-10-CM

## 2022-05-08 DIAGNOSIS — Q851 Tuberous sclerosis: Secondary | ICD-10-CM | POA: Diagnosis not present

## 2022-05-08 MED ORDER — LEVOTHYROXINE SODIUM 100 MCG PO TABS: 100.0000 ug | ORAL_TABLET | Freq: Every day | ORAL | 3 refills | Status: DC

## 2022-05-11 ENCOUNTER — Encounter: Payer: Self-pay | Admitting: Neurology

## 2022-05-18 ENCOUNTER — Other Ambulatory Visit (INDEPENDENT_AMBULATORY_CARE_PROVIDER_SITE_OTHER): Payer: Self-pay | Admitting: Adult Health

## 2022-05-18 DIAGNOSIS — R7303 Prediabetes: Secondary | ICD-10-CM

## 2022-06-01 ENCOUNTER — Ambulatory Visit (INDEPENDENT_AMBULATORY_CARE_PROVIDER_SITE_OTHER): Payer: BC Managed Care – PPO | Admitting: Adult Health

## 2022-06-01 ENCOUNTER — Encounter: Payer: Self-pay | Admitting: Neurology

## 2022-06-04 ENCOUNTER — Encounter: Payer: Self-pay | Admitting: Neurology

## 2022-06-11 ENCOUNTER — Ambulatory Visit (INDEPENDENT_AMBULATORY_CARE_PROVIDER_SITE_OTHER): Payer: BC Managed Care – PPO | Admitting: Adult Health

## 2022-06-11 ENCOUNTER — Encounter (INDEPENDENT_AMBULATORY_CARE_PROVIDER_SITE_OTHER): Payer: Self-pay | Admitting: Adult Health

## 2022-06-11 VITALS — BP 127/79 | HR 66 | Temp 97.4°F | Ht 60.0 in | Wt 155.0 lb

## 2022-06-11 DIAGNOSIS — R7303 Prediabetes: Secondary | ICD-10-CM | POA: Diagnosis not present

## 2022-06-11 DIAGNOSIS — E669 Obesity, unspecified: Secondary | ICD-10-CM

## 2022-06-11 DIAGNOSIS — Z6835 Body mass index (BMI) 35.0-35.9, adult: Secondary | ICD-10-CM

## 2022-06-11 DIAGNOSIS — Z6830 Body mass index (BMI) 30.0-30.9, adult: Secondary | ICD-10-CM

## 2022-06-11 MED ORDER — METFORMIN HCL ER 750 MG PO TB24: 750.0000 mg | ORAL_TABLET | Freq: Every day | ORAL | 0 refills | Status: DC

## 2022-06-11 MED ORDER — METFORMIN HCL ER 750 MG PO TB24
ORAL_TABLET | ORAL | 0 refills | Status: DC
Start: 2022-06-11 — End: 2022-07-13

## 2022-06-17 NOTE — Progress Notes (Unsigned)
Chief Complaint:   OBESITY [name] is here to discuss her progress with her obesity treatment plan along with follow-up of her obesity related diagnoses. [name] is on keeping a food journal and adhering to recommended goals of 1100-1200 calories and 85 grams of protein daily and states she is following her eating plan approximately 50% of the time. [name] states she is doing strengthening, walking, bike riding, and gardening for 30 minutes 5 times per week.  Today's visit was #: 22 Starting weight: 183 lbs Starting date: 05/28/2021 Today's weight: 155 lbs Today's date: 06/11/2022 Total lbs lost to date: 28 Total lbs lost since last in-office visit: 0  Interim History: Jeanean's muscle mass is -0.4 lbs, adipose mass -1.8 lbs, and water weight +1.4 lbs.  On 04/20/2022 RMR was 1526.  Subjective:   1. Pre-diabetes [name] endorses diarrhea with plan formulation.  Currently on metformin 1000 mg once daily at first meal.  She will often experience polyphagia late afternoon and late evening, even with adequate protein intake.   Assessment/Plan:   1. Pre-diabetes [name] agreed to increase metformin XR to 750 mg twice daily with meals, with no refills.  - metFORMIN (GLUCOPHAGE-XR) 750 MG 24 hr tablet; 1 tab with first meal and 1 tab with dinner  Dispense: 60 tablet; Refill: 0  2. Obesity with current BMI 30.4 [name] is currently in the action stage of change. As such, her goal is to continue with weight loss efforts. She has agreed to keeping a food journal and adhering to recommended goals of 1200 calories and 100 grams of protein daily.   Exercise goals: As is.   Behavioral modification strategies: increasing lean protein intake, decreasing simple carbohydrates, meal planning and cooking strategies, keeping healthy foods in the home, and planning for success.  Christmas has agreed to follow-up with our clinic in 4 weeks. She was informed of the importance of frequent  follow-up visits to maximize her success with intensive lifestyle modifications for her multiple health conditions.   Objective:   Blood pressure 127/79, pulse 66, temperature (!) 97.4 F (36.3 C), height 5' (1.524 m), weight 155 lb (70.3 kg), SpO2 98 %. Body mass index is 30.27 kg/m.  General: Cooperative, alert, well developed, in no acute distress. HEENT: Conjunctivae and lids unremarkable. Cardiovascular: Regular rhythm.  Lungs: Normal work of breathing. Neurologic: No focal deficits.   Lab Results  Component Value Date   CREATININE 0.80 04/20/2022   BUN 20 04/20/2022   NA 142 04/20/2022   K 4.2 04/20/2022   CL 101 04/20/2022   CO2 24 04/20/2022   Lab Results  Component Value Date   ALT 18 04/20/2022   AST 17 04/20/2022   ALKPHOS 71 04/20/2022   BILITOT 0.5 04/20/2022   Lab Results  Component Value Date   HGBA1C 5.6 04/20/2022   HGBA1C 5.7 (H) 01/08/2022   HGBA1C 5.6 07/22/2021   HGBA1C 5.7 (H) 01/03/2021   HGBA1C 5.7 (H) 06/03/2020   Lab Results  Component Value Date   INSULIN 7.9 04/20/2022   INSULIN 7.7 01/08/2022   INSULIN 12.0 04/03/2020   Lab Results  Component Value Date   TSH 0.430 (L) 01/08/2022   Lab Results  Component Value Date   CHOL 257 (H) 01/08/2022   HDL 63 01/08/2022   LDLCALC 177 (H) 01/08/2022   TRIG 96 01/08/2022   CHOLHDL 4.1 01/08/2022   Lab Results  Component Value Date   VD25OH 37.1 04/20/2022   VD25OH 31.5 01/08/2022   VD25OH  57.0 07/22/2021   Lab Results  Component Value Date   WBC 6.3 06/03/2020   HGB 14.6 06/03/2020   HCT 43.4 06/03/2020   MCV 97 06/03/2020   PLT 314 06/03/2020   No results found for: "IRON", "TIBC", "FERRITIN"  Attestation Statements:   Reviewed by clinician on day of visit: allergies, medications, problem list, medical history, surgical history, family history, social history, and previous encounter notes.   Trude Mcburney, am acting as transcriptionist for William Hamburger, NP.  I have  reviewed the above documentation for accuracy and completeness, and I agree with the above. -  ***

## 2022-06-24 ENCOUNTER — Encounter (INDEPENDENT_AMBULATORY_CARE_PROVIDER_SITE_OTHER): Payer: Self-pay

## 2022-07-02 NOTE — Progress Notes (Unsigned)
NEUROLOGY CONSULTATION NOTE  [name] MRN: [id number] DOB: [birth year]  Referring provider: Meredith Staggers, MD Primary care provider: Meredith Staggers, MD  Reason for consult:  headache, abnormal brain MRI  Assessment/Plan:   Primary thunderclap headache.  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  I do not suspect patient has increased intracranial pressure  No further workup or management warranted Follow up as needed  Total time spent with patient face to face and reviewing notes:  30 minutes   Subjective:  [name] is a 61 year old female with HTN, HLD, hypothyroidism, tuberous sclerosis, CKD and prediabetes who presents for headache and abnormal brain MRI.  History supplemented by referring provider's notes.  Around March-April, she was shopping in [employer] when she developed sudden onset severe pain in her head, "like a lightening bolt" lasting 20 seconds.  Denies neck pain, vision changes, nausea, vomiting, speech disturbance, vertigo/dizziness or unilateral numbness or weakness.  She has prior history of migraines but never before had anything like it.  She had a similar but more mild episode several days later. MRI with and without and MRA of head on 04/10/2022 personally reviewed revealed partially empty sella but otherwise unremarkable.  She has had no recurrence.        PAST MEDICAL HISTORY: Past Medical History:  Diagnosis Date   Allergy    dust mite allergies; Zyrtec.   Anemia    Back pain    Blood transfusion without reported diagnosis    post-operatively in 20s after L1 fracture with spinal cord injury after horseback riding.   Chronic kidney disease    Tuberous Sclerosis; [facility] at [facility].   Edema of both lower extremities    Hyperlipidemia    Hypertension    Hypothyroidism    Lower back pain    Pre-diabetes    Thyroid disease    Vitamin D deficiency     PAST SURGICAL HISTORY: Past Surgical History:  Procedure Laterality  Date   ABDOMINAL HYSTERECTOMY     DUB; ovaries remaining.   CESAREAN SECTION     MENISCUS REPAIR     Sleep Study  12/17/2010   No sleep disordered breathing or periodic limb mvmets.  Excess light sleep.   SPINE SURGERY     L1 fracture with spinal cord injury with horseback riding.   TUBAL LIGATION      MEDICATIONS: Current Outpatient Medications on File Prior to Visit  Medication Sig Dispense Refill   enalapril (VASOTEC) 20 MG tablet Take 1 tablet (20 mg total) by mouth daily. 90 tablet 3   levothyroxine (SYNTHROID) 100 MCG tablet Take 1 tablet (100 mcg total) by mouth daily before breakfast. 90 tablet 3   meloxicam (MOBIC) 7.5 MG tablet Take 1 tablet (7.5 mg total) by mouth daily as needed for pain. 30 tablet 0   metFORMIN (GLUCOPHAGE-XR) 750 MG 24 hr tablet 1 tab with first meal and 1 tab with dinner 60 tablet 0   Multiple Vitamins-Minerals (CENTRUM ADULTS PO) Take by mouth.     No current facility-administered medications on file prior to visit.    ALLERGIES: Allergies  Allergen Reactions   Saxenda [Liraglutide -Weight Management] Nausea And Vomiting    With borderline high lipase - concern for possible pancreatitis.    Sulfa Antibiotics Hives   Lipitor [Atorvastatin] Other (See Comments)    Fatigue and confusion    FAMILY HISTORY: Family History  Problem Relation Age of Onset   Thyroid disease Mother  Arthritis Mother    Asthma Mother    Migraines Mother    High blood pressure Mother    Sleep apnea Mother    Obesity Mother    Heart failure Father    Hyperlipidemia Father    Heart disease Father 34       AMI age 81; CABG at 48.   Arthritis Father    High Cholesterol Father    AAA (abdominal aortic aneurysm) Father     Objective:   General: No acute distress.  Patient appears well-groomed.   Head:  Normocephalic/atraumatic Eyes:  fundi examined but not visualized Neck: supple, no paraspinal tenderness, full range of motion Back: No paraspinal  tenderness Heart: regular rate and rhythm Neurological Exam: Mental status: alert and oriented to person, place, and time, speech fluent and not dysarthric, language intact. Cranial nerves: CN I: not tested CN II: pupils equal, round and reactive to light, visual fields intact CN III, IV, VI:  full range of motion, no nystagmus, no ptosis CN V: facial sensation intact. CN VII: upper and lower face symmetric CN VIII: hearing intact CN IX, X: gag intact, uvula midline CN XI: sternocleidomastoid and trapezius muscles intact CN XII: tongue midline Bulk & Tone: normal, no fasciculations. Motor:  muscle strength 5/5 throughout Sensation:  Pinprick, temperature and vibratory sensation intact. Deep Tendon Reflexes:  2+ throughout,  toes downgoing.   Finger to nose testing:  Without dysmetria.   Heel to shin:  Without dysmetria.   Gait:  Normal station and stride.  Romberg negative.    Thank you for allowing me to take part in the care of this patient.  Shon Millet, DO  CC: Meredith Staggers, MD

## 2022-07-06 ENCOUNTER — Ambulatory Visit: Payer: BC Managed Care – PPO | Admitting: Neurology

## 2022-07-06 ENCOUNTER — Other Ambulatory Visit (INDEPENDENT_AMBULATORY_CARE_PROVIDER_SITE_OTHER): Payer: Self-pay | Admitting: Adult Health

## 2022-07-06 ENCOUNTER — Encounter: Payer: Self-pay | Admitting: Neurology

## 2022-07-06 DIAGNOSIS — R7303 Prediabetes: Secondary | ICD-10-CM

## 2022-07-06 DIAGNOSIS — G4453 Primary thunderclap headache: Secondary | ICD-10-CM

## 2022-07-13 ENCOUNTER — Encounter (INDEPENDENT_AMBULATORY_CARE_PROVIDER_SITE_OTHER): Payer: Self-pay | Admitting: Adult Health

## 2022-07-13 ENCOUNTER — Ambulatory Visit (INDEPENDENT_AMBULATORY_CARE_PROVIDER_SITE_OTHER): Payer: BC Managed Care – PPO | Admitting: Adult Health

## 2022-07-13 VITALS — BP 145/83 | HR 66 | Temp 98.9°F | Ht 60.0 in | Wt 158.0 lb

## 2022-07-13 DIAGNOSIS — Z6830 Body mass index (BMI) 30.0-30.9, adult: Secondary | ICD-10-CM

## 2022-07-13 DIAGNOSIS — R7303 Prediabetes: Secondary | ICD-10-CM

## 2022-07-13 DIAGNOSIS — I10 Essential (primary) hypertension: Secondary | ICD-10-CM

## 2022-07-13 DIAGNOSIS — E669 Obesity, unspecified: Secondary | ICD-10-CM

## 2022-07-13 MED ORDER — METFORMIN HCL ER 750 MG PO TB24: ORAL_TABLET | ORAL | 0 refills | Status: DC

## 2022-07-17 ENCOUNTER — Other Ambulatory Visit: Payer: Self-pay | Admitting: Family Medicine

## 2022-07-17 DIAGNOSIS — I10 Essential (primary) hypertension: Secondary | ICD-10-CM

## 2022-07-18 NOTE — Progress Notes (Unsigned)
Chief Complaint:   OBESITY [name] is here to discuss her progress with her obesity treatment plan along with follow-up of her obesity related diagnoses. [name] is on keeping a food journal and adhering to recommended goals of 1200 calories and 100 g  protein and states she is following her eating plan approximately 80% of the time. [name] states she is walking, biking, and doing strength training 30 minutes 4 times per week.  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 Pre-diabetes Polyphagia normally well controlled with metformin XR 750mg  QD.   Acute stress triggered increased appetite over the weekend.   2. Essential hypertension Blood pressure above goal.   Acute stress triggered increased sodium consumption over the weekend.  No cardiac symptoms at present.  She denies tobacco/vape use.  Assessment/Plan:   1. Pre-diabetes Refill - metFORMIN (GLUCOPHAGE-XR) 750 MG 24 hr tablet; 1 tab with first meal and 1 tab with dinner  Dispense: 60 tablet; Refill: 0  2. Essential hypertension Decrease sodium intake.  Increase water intake.  3. Obesity with current BMI 30.9 Handouts:  Multiple recipes, 100/200 snack sheet.  [name] is currently in the action stage of change. As such, her goal is to continue with weight loss efforts. She has agreed to keeping a food journal and adhering to recommended goals of 1200 calories and 100 protein.   Exercise goals:  As is.  Behavioral modification strategies: increasing lean protein intake, decreasing simple carbohydrates, decreasing sodium intake,  meal planning and cooking strategies, keeping healthy foods in the home, better snacking choices, emotional eating strategies, and planning for success.  [name] has agreed to follow-up with our clinic in 4 weeks. She was informed of the importance of frequent follow-up visits to maximize her success with intensive lifestyle modifications for her multiple health conditions.   Objective:   Blood pressure (!) 145/83, pulse 66, temperature 98.9 F (37.2 C), height 5' (1.524 m), weight 158 lb (71.7 kg), SpO2 97 %. Body mass index is 30.86 kg/m.  General: Cooperative, alert, well developed, in no acute distress. HEENT: Conjunctivae and lids unremarkable. Cardiovascular: Regular rhythm.  Lungs: Normal work of breathing. Neurologic: No focal deficits.   Lab Results  Component Value Date   CREATININE 0.80 04/20/2022   BUN 20 04/20/2022   NA 142 04/20/2022   K 4.2 04/20/2022   CL 101 04/20/2022   CO2 24 04/20/2022   Lab Results  Component Value Date   ALT 18 04/20/2022   AST 17 04/20/2022   ALKPHOS 71 04/20/2022   BILITOT 0.5 04/20/2022   Lab Results  Component Value Date   HGBA1C 5.6 04/20/2022   HGBA1C 5.7 (H) 01/08/2022   HGBA1C 5.6 07/22/2021   HGBA1C 5.7 (H) 01/03/2021   HGBA1C 5.7 (H) 06/03/2020   Lab Results  Component Value Date   INSULIN 7.9 04/20/2022   INSULIN 7.7 01/08/2022   INSULIN 12.0 04/03/2020   Lab Results  Component Value Date   TSH 0.430 (L) 01/08/2022   Lab Results  Component Value Date   CHOL 257 (H) 01/08/2022   HDL 63 01/08/2022  LDLCALC 177 (H) 01/08/2022   TRIG 96 01/08/2022   CHOLHDL 4.1 01/08/2022   Lab Results  Component Value Date   VD25OH 37.1 04/20/2022   VD25OH 31.5 01/08/2022   VD25OH 57.0 07/22/2021   Lab Results  Component Value Date   WBC 6.3 06/03/2020   HGB 14.6 06/03/2020   HCT 43.4 06/03/2020   MCV 97 06/03/2020   PLT 314 06/03/2020   No results found for: "IRON", "TIBC", "FERRITIN"  Attestation Statements:    Reviewed by clinician on day of visit: allergies, medications, problem list, medical history, surgical history, family history, social history, and previous encounter notes.  I, Malcolm Metro, RMA, am acting as Energy manager for William Hamburger, NP.  I have reviewed the above documentation for accuracy and completeness, and I agree with the above. -  Katy d. Danford, NP-C

## 2022-08-05 ENCOUNTER — Other Ambulatory Visit (INDEPENDENT_AMBULATORY_CARE_PROVIDER_SITE_OTHER): Payer: Self-pay | Admitting: Adult Health

## 2022-08-05 DIAGNOSIS — R7303 Prediabetes: Secondary | ICD-10-CM

## 2022-08-10 ENCOUNTER — Ambulatory Visit (INDEPENDENT_AMBULATORY_CARE_PROVIDER_SITE_OTHER): Payer: BC Managed Care – PPO | Admitting: Adult Health

## 2022-08-10 ENCOUNTER — Encounter (INDEPENDENT_AMBULATORY_CARE_PROVIDER_SITE_OTHER): Payer: Self-pay | Admitting: Adult Health

## 2022-08-10 VITALS — BP 133/83 | HR 65 | Temp 98.2°F | Ht 60.0 in | Wt 157.0 lb

## 2022-08-10 DIAGNOSIS — R7303 Prediabetes: Secondary | ICD-10-CM | POA: Diagnosis not present

## 2022-08-10 DIAGNOSIS — E559 Vitamin D deficiency, unspecified: Secondary | ICD-10-CM | POA: Diagnosis not present

## 2022-08-10 DIAGNOSIS — E669 Obesity, unspecified: Secondary | ICD-10-CM

## 2022-08-10 DIAGNOSIS — Z6830 Body mass index (BMI) 30.0-30.9, adult: Secondary | ICD-10-CM

## 2022-08-10 MED ORDER — METFORMIN HCL ER 750 MG PO TB24: ORAL_TABLET | ORAL | 0 refills | Status: DC

## 2022-08-10 NOTE — Progress Notes (Unsigned)
Chief Complaint:   OBESITY [name] is here to discuss her progress with her obesity treatment plan along with follow-up of her obesity related diagnoses. [name] is on keeping a food journal and adhering to recommended goals of 1200 calories and 100 protein and states she is following her eating plan approximately 50% of the time. [name] states she is walking 30 minutes 3-4 times per week.  Today's visit was #: 24 Starting weight: 183 lbs Starting date: 05/28/2022 Today's weight: 157 lbs Today's date: 08/10/2022 Total lbs lost to date: 26 lbs Total lbs lost since last in-office visit: 1 lb  Interim History: She is taking metformin XR 750 mg with 1st meal and at dinner.  She is bored with journaling plan and is interested in converting to another meal plan.   Subjective:   1. Pre-diabetes *** A1c ***   2. Vitamin D deficiency 04/20/2022, Vitamin D level 37.1.  she is not on any Vitamin D supplementation.  Assessment/Plan:   1. Pre-diabetes Check fasting labs at next office visit.    Refill - metFORMIN (GLUCOPHAGE-XR) 750 MG 24 hr tablet; 1 tab with first meal and 1 tab with dinner  Dispense: 60 tablet; Refill: 0  2. Vitamin D deficiency Check labs at next office visit.   3. Obesity with current BMI 30.7 Check fasting labs at next office visit.   [name] is currently in the action stage of change. As such, her goal is to continue with weight loss efforts. She has agreed to the Category 2 Plan.   Exercise goals:  as is.   Behavioral modification strategies: increasing lean protein intake, decreasing simple carbohydrates, meal planning and cooking strategies, keeping healthy foods in the home, and planning for success.  [name] has agreed to follow-up with our clinic in 4 weeks. She was informed of the importance of frequent follow-up visits to maximize her success with intensive lifestyle modifications for her multiple health conditions.   Objective:   Blood  pressure 133/83, pulse 65, temperature 98.2 F (36.8 C), height 5' (1.524 m), weight 157 lb (71.2 kg), SpO2 98 %. Body mass index is 30.66 kg/m.  General: Cooperative, alert, well developed, in no acute distress. HEENT: Conjunctivae and lids unremarkable. Cardiovascular: Regular rhythm.  Lungs: Normal work of breathing. Neurologic: No focal deficits.   Lab Results  Component Value Date   CREATININE 0.80 04/20/2022   BUN 20 04/20/2022   NA 142 04/20/2022   K 4.2 04/20/2022   CL 101 04/20/2022   CO2 24 04/20/2022   Lab Results  Component Value Date   ALT 18 04/20/2022   AST 17 04/20/2022   ALKPHOS 71 04/20/2022   BILITOT 0.5 04/20/2022   Lab Results  Component Value Date   HGBA1C 5.6 04/20/2022   HGBA1C 5.7 (H) 01/08/2022   HGBA1C 5.6 07/22/2021   HGBA1C 5.7 (H) 01/03/2021   HGBA1C 5.7 (H) 06/03/2020   Lab Results  Component Value Date   INSULIN 7.9 04/20/2022   INSULIN 7.7 01/08/2022   INSULIN 12.0 04/03/2020   Lab Results  Component Value Date   TSH 0.430 (L) 01/08/2022   Lab Results  Component Value Date   CHOL 257 (H) 01/08/2022   HDL 63 01/08/2022   LDLCALC 177 (H) 01/08/2022   TRIG 96 01/08/2022   CHOLHDL 4.1 01/08/2022   Lab Results  Component Value Date   VD25OH 37.1 04/20/2022   VD25OH 31.5 01/08/2022   VD25OH 57.0 07/22/2021   Lab Results  Component Value  Date   WBC 6.3 06/03/2020   HGB 14.6 06/03/2020   HCT 43.4 06/03/2020   MCV 97 06/03/2020   PLT 314 06/03/2020   No results found for: "IRON", "TIBC", "FERRITIN"  Attestation Statements:   Reviewed by clinician on day of visit: allergies, medications, problem list, medical history, surgical history, family history, social history, and previous encounter notes.  I, Davy Pique, RMA, am acting as Location manager for Mina Marble, NP.  I have reviewed the above documentation for accuracy and completeness, and I agree with the above. -  ***

## 2022-09-03 ENCOUNTER — Other Ambulatory Visit (INDEPENDENT_AMBULATORY_CARE_PROVIDER_SITE_OTHER): Payer: Self-pay | Admitting: Adult Health

## 2022-09-03 DIAGNOSIS — R7303 Prediabetes: Secondary | ICD-10-CM

## 2022-09-07 ENCOUNTER — Other Ambulatory Visit (INDEPENDENT_AMBULATORY_CARE_PROVIDER_SITE_OTHER): Payer: Self-pay | Admitting: Adult Health

## 2022-09-07 DIAGNOSIS — R7303 Prediabetes: Secondary | ICD-10-CM

## 2022-09-09 ENCOUNTER — Encounter (INDEPENDENT_AMBULATORY_CARE_PROVIDER_SITE_OTHER): Payer: Self-pay | Admitting: Adult Health

## 2022-09-09 ENCOUNTER — Ambulatory Visit (INDEPENDENT_AMBULATORY_CARE_PROVIDER_SITE_OTHER): Payer: BC Managed Care – PPO | Admitting: Adult Health

## 2022-09-09 VITALS — BP 135/84 | HR 71 | Temp 97.9°F | Ht 60.0 in | Wt 160.0 lb

## 2022-09-09 DIAGNOSIS — E039 Hypothyroidism, unspecified: Secondary | ICD-10-CM

## 2022-09-09 DIAGNOSIS — R7303 Prediabetes: Secondary | ICD-10-CM | POA: Diagnosis not present

## 2022-09-09 DIAGNOSIS — R5383 Other fatigue: Secondary | ICD-10-CM

## 2022-09-09 DIAGNOSIS — F439 Reaction to severe stress, unspecified: Secondary | ICD-10-CM | POA: Diagnosis not present

## 2022-09-09 DIAGNOSIS — E782 Mixed hyperlipidemia: Secondary | ICD-10-CM

## 2022-09-09 DIAGNOSIS — E038 Other specified hypothyroidism: Secondary | ICD-10-CM | POA: Diagnosis not present

## 2022-09-09 DIAGNOSIS — Z6831 Body mass index (BMI) 31.0-31.9, adult: Secondary | ICD-10-CM

## 2022-09-09 DIAGNOSIS — E669 Obesity, unspecified: Secondary | ICD-10-CM

## 2022-09-09 MED ORDER — METFORMIN HCL ER 750 MG PO TB24: ORAL_TABLET | ORAL | 0 refills | Status: DC

## 2022-09-10 ENCOUNTER — Encounter (INDEPENDENT_AMBULATORY_CARE_PROVIDER_SITE_OTHER): Payer: Self-pay | Admitting: Adult Health

## 2022-09-10 ENCOUNTER — Ambulatory Visit (INDEPENDENT_AMBULATORY_CARE_PROVIDER_SITE_OTHER): Payer: BC Managed Care – PPO | Admitting: Family Medicine

## 2022-09-10 ENCOUNTER — Encounter: Payer: Self-pay | Admitting: Family Medicine

## 2022-09-10 VITALS — BP 124/78 | HR 65 | Temp 97.8°F | Ht 60.0 in | Wt 162.4 lb

## 2022-09-10 DIAGNOSIS — G4700 Insomnia, unspecified: Secondary | ICD-10-CM

## 2022-09-10 DIAGNOSIS — R002 Palpitations: Secondary | ICD-10-CM

## 2022-09-10 DIAGNOSIS — E039 Hypothyroidism, unspecified: Secondary | ICD-10-CM

## 2022-09-10 DIAGNOSIS — R7989 Other specified abnormal findings of blood chemistry: Secondary | ICD-10-CM

## 2022-09-10 MED ORDER — LEVOTHYROXINE SODIUM 88 MCG PO TABS: 88.0000 ug | ORAL_TABLET | Freq: Every day | ORAL | 3 refills | Status: DC

## 2022-09-10 NOTE — Patient Instructions (Signed)
Decrease Synthroid to 88 mcg/day.  I expect your symptoms to improve including heart palpitations, sleep, temperature sensitivity.  If any persistent symptoms in the next 2 weeks or any worsening symptoms, return for recheck.  Otherwise follow-up with me in 6 weeks and we can discuss medication and repeat lab work at that time.  Take care.

## 2022-09-10 NOTE — Progress Notes (Signed)
Subjective:  Patient ID: [name], female    DOB: 1961/05/09  Age: 61 y.o. MRN: [id number]  CC:  Chief Complaint  Patient presents with   Thyroid Problem    Pt wants to adjust thyroid meds, [facility] and they completed labs and said that her TSH was low    Depression    PHQ9 - 6    HPI [name] presents for  Hypothyroidism: Lab Results  Component Value Date   TSH 0.234 (L) 09/09/2022  currently on Synthroid 100 mcg daily Waking up at night hot or cold, some heart palpitations at night - sometimes during day - harder heartbeat, slight faster. Past 6 weeks or so, more noticed past 2 weeks.  Occasional dizziness in morning, no syncope or presyncope. No melena or hematochezia.  No chest pain. No current palpitations.  Waking up few times at night. No sleep meds.  Felt similar in past with overtreated thyroid.  TSH trend - 1.25 in 09/02/21, 0.430 (with free t4 1.98) 01/08/22, 1.94 on may 25th, low at 0.234 (free t4 1.70) on 09/09/22.  Reassuring CBC, CMP yesterday.      09/10/2022    1:45 PM 01/30/2022    9:29 AM 05/28/2021    9:13 AM 04/02/2021   10:43 AM 01/03/2021    8:14 AM  Depression screen PHQ 2/9  Decreased Interest 0 0 1 0 0  Down, Depressed, Hopeless 0 0 1 0 0  PHQ - 2 Score 0 0 2 0 0  Altered sleeping 2 2 0    Tired, decreased energy 2 2 1     Change in appetite 2 1 1     Feeling bad or failure about yourself  0 0 1    Trouble concentrating 0 0 0    Moving slowly or fidgety/restless 0 0 0    Suicidal thoughts 0 0 0    PHQ-9 Score 6 5 5     Difficult doing work/chores   Not difficult at all        History Patient Active Problem List   Diagnosis Date Noted   Vitamin D deficiency 07/22/2021   COVID-19 04/22/2021   Elevated ALT measurement 04/04/2020   Pre-diabetes 06/09/2017   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   Anemia    Back pain    Blood transfusion without reported diagnosis    post-operatively in 20s after L1 fracture with spinal cord injury after horseback riding.   Chronic kidney disease    Tuberous Sclerosis; [facility] at [facility]   Edema of both lower extremities    Hyperlipidemia    Hypertension    Hypothyroidism    Lower back pain    Pre-diabetes    Thyroid disease    Vitamin D deficiency    Past Surgical History:  Procedure Laterality Date   ABDOMINAL HYSTERECTOMY     DUB; ovaries remaining.   CESAREAN SECTION     MENISCUS REPAIR     Sleep Study  12/17/2010   No sleep disordered breathing or periodic limb mvmets.  Excess light sleep.   SPINE SURGERY     L1 fracture with spinal cord injury  with horseback riding.   TUBAL LIGATION     Allergies  Allergen Reactions   Saxenda [Liraglutide -Weight Management] Nausea And Vomiting    With borderline high lipase - concern for possible pancreatitis.    Sulfa Antibiotics Hives   Lipitor [Atorvastatin] Other (See Comments)    Fatigue and confusion   Prior to Admission medications   Medication Sig Start Date End Date Taking? Authorizing Provider  enalapril (VASOTEC) 20 MG tablet TAKE 1 TABLET BY MOUTH EVERY DAY 07/17/22  Yes [name], MD  levothyroxine (SYNTHROID) 100 MCG tablet Take 1 tablet (100 mcg total) by mouth daily before breakfast. 05/08/22  Yes [name], MD  meloxicam (MOBIC) 7.5 MG tablet Take 1 tablet (7.5 mg total) by mouth daily as needed for pain. 05/29/21  Yes [name], MD  metFORMIN (GLUCOPHAGE-XR) 750 MG 24 hr tablet 1 tab with first meal and 1 tab with dinner 09/09/22  Yes Danford, Orpha Bur D, NP  Multiple Vitamins-Minerals (CENTRUM ADULTS PO) Take by mouth.   Yes [provider]  levothyroxine (SYNTHROID) 112 MCG tablet     [provider]   Social History   Socioeconomic History   Marital status: Married    Spouse name: Burnis Kingfisher   Number of children: Not on file   Years of education: Not on file   Highest education level: Not on file  Occupational History   Occupation: Tourist information centre manager  Tobacco Use   Smoking status: Never   Smokeless tobacco: Never  Vaping Use   Vaping Use: Never used  Substance and Sexual Activity   Alcohol use: Yes    Comment: occ   Drug use: No   Sexual activity: Yes    Birth control/protection: None  Other Topics Concern   Not on file  Social History Narrative   Marital status: married x 20+ years; happily married.      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      Tobacco; none      Alcohol:  Socially      Drugs: none      Exercise: sporadic   Social Determinants of Health   Financial Resource Strain: Not on file  Food Insecurity: Not on file  Transportation Needs: Not on file  Physical Activity: Not on file  Stress: Not on file  Social Connections: Not on file  Intimate Partner Violence: Not on file    Review of Systems Per HPI.   Objective:   Vitals:   09/10/22 1348  BP: 124/78  Pulse: 65  Temp: 97.8 F (36.6 C)  SpO2: 97%  Weight: 162 lb 6.4 oz (73.7 kg)  Height: 5' (1.524 m)     Physical Exam Vitals reviewed.  Constitutional:      General: She is not in acute distress.    Appearance: Normal appearance. She is well-developed. She is not ill-appearing or toxic-appearing.  HENT:     Head: Normocephalic and atraumatic.  Eyes:     Conjunctiva/sclera: Conjunctivae normal.     Pupils: Pupils are equal, round, and reactive to light.  Neck:     Vascular: No carotid bruit.  Cardiovascular:     Rate and Rhythm: Normal rate and regular rhythm.     Heart sounds: Normal heart sounds.   Pulmonary:     Effort: Pulmonary effort is normal.     Breath  sounds: Normal breath sounds.  Abdominal:     Palpations: Abdomen is soft. There is no pulsatile mass.     Tenderness: There is no abdominal tenderness.  Musculoskeletal:     Right lower leg: No edema.     Left lower leg: No edema.  Skin:    General: Skin is warm and dry.  Neurological:     Mental Status: She is alert and oriented to person, place, and time.  Psychiatric:        Mood and Affect: Mood normal.        Behavior: Behavior normal.    EKG, sinus rhythm, rate 71.  No apparent acute findings, including with comparison to 05/28/2021, baseline artifact noted in multiple leads.   Assessment & Plan:  DOMINGA MCDUFFIE is a 61 y.o. female . Hypothyroidism, unspecified type - Plan: levothyroxine (SYNTHROID) 88 MCG tablet  Palpitations - Plan: EKG 12-Lead  Insomnia, unspecified type  Low TSH level  Recent TSH indicates overtreatment.  Similar symptoms in the past with overtreatment as above.  EKG without acute findings.  Recent CBC, CMP reassuring.  Lower dose of Synthroid 88 mcg daily for now, RTC precautions if symptoms do not quickly improve or if new/worsening symptoms.  6-week follow-up with repeat labs at that time.  Meds ordered this encounter  Medications   levothyroxine (SYNTHROID) 88 MCG tablet    Sig: Take 1 tablet (88 mcg total) by mouth daily.    Dispense:  90 tablet    Refill:  3   Patient Instructions  Decrease Synthroid to 88 mcg/day.  I expect your symptoms to improve including heart palpitations, sleep, temperature sensitivity.  If any persistent symptoms in the next 2 weeks or any worsening symptoms, return for recheck.  Otherwise follow-up with me in 6 weeks and we can discuss medication and repeat lab work at that time.  Take care.        Signed,   Merri Ray, MD [place] 09/10/22 9:56 PM

## 2022-09-11 LAB — COMPREHENSIVE METABOLIC PANEL
ALT: 22 IU/L (ref 0–32)
AST: 21 IU/L (ref 0–40)
Albumin/Globulin Ratio: 2.3 — ABNORMAL HIGH (ref 1.2–2.2)
Albumin: 4.9 g/dL (ref 3.9–4.9)
Alkaline Phosphatase: 64 IU/L (ref 44–121)
BUN/Creatinine Ratio: 18 (ref 12–28)
BUN: 15 mg/dL (ref 8–27)
Bilirubin Total: 0.4 mg/dL (ref 0.0–1.2)
CO2: 25 mmol/L (ref 20–29)
Calcium: 10.1 mg/dL (ref 8.7–10.3)
Chloride: 102 mmol/L (ref 96–106)
Creatinine, Ser: 0.85 mg/dL (ref 0.57–1.00)
Globulin, Total: 2.1 g/dL (ref 1.5–4.5)
Glucose: 88 mg/dL (ref 70–99)
Potassium: 4.6 mmol/L (ref 3.5–5.2)
Sodium: 140 mmol/L (ref 134–144)
Total Protein: 7 g/dL (ref 6.0–8.5)
eGFR: 77 mL/min/{1.73_m2} (ref >59)

## 2022-09-11 LAB — CBC WITH DIFFERENTIAL/PLATELET
Basophils Absolute: 0 10*3/uL (ref 0.0–0.2)
Basos: 1 %
EOS (ABSOLUTE): 0.2 10*3/uL (ref 0.0–0.4)
Eos: 4 %
Hematocrit: 43.8 % (ref 34.0–46.6)
Hemoglobin: 15 g/dL (ref 11.1–15.9)
Immature Grans (Abs): 0 10*3/uL (ref 0.0–0.1)
Immature Granulocytes: 0 %
Lymphocytes Absolute: 3.1 10*3/uL (ref 0.7–3.1)
Lymphs: 45 %
MCH: 33 pg (ref 26.6–33.0)
MCHC: 34.2 g/dL (ref 31.5–35.7)
MCV: 97 fL (ref 79–97)
Monocytes Absolute: 0.5 10*3/uL (ref 0.1–0.9)
Monocytes: 8 %
Neutrophils Absolute: 2.7 10*3/uL (ref 1.4–7.0)
Neutrophils: 42 %
Platelets: 354 10*3/uL (ref 150–450)
RBC: 4.54 x10E6/uL (ref 3.77–5.28)
RDW: 12.8 % (ref 11.7–15.4)
WBC: 6.6 10*3/uL (ref 3.4–10.8)

## 2022-09-11 LAB — LIPID PANEL
Chol/HDL Ratio: 4.2 ratio (ref 0.0–4.4)
Cholesterol, Total: 259 mg/dL — ABNORMAL HIGH (ref 100–199)
HDL: 62 mg/dL (ref >39)
LDL Chol Calc (NIH): 145 mg/dL — ABNORMAL HIGH (ref 0–99)
Triglycerides: 287 mg/dL — ABNORMAL HIGH (ref 0–149)
VLDL Cholesterol Cal: 52 mg/dL — ABNORMAL HIGH (ref 5–40)

## 2022-09-11 LAB — TSH+FREE T4
Free T4: 1.7 ng/dL (ref 0.82–1.77)
TSH: 0.234 u[IU]/mL — ABNORMAL LOW (ref 0.450–4.500)

## 2022-09-11 LAB — HEMOGLOBIN A1C
Est. average glucose Bld gHb Est-mCnc: 114 mg/dL
Hgb A1c MFr Bld: 5.6 % (ref 4.8–5.6)

## 2022-09-11 LAB — VITAMIN D 25 HYDROXY (VIT D DEFICIENCY, FRACTURES): Vit D, 25-Hydroxy: 32.7 ng/mL (ref 30.0–100.0)

## 2022-09-11 LAB — VITAMIN B12: Vitamin B-12: 762 pg/mL (ref 232–1245)

## 2022-09-11 LAB — INSULIN, RANDOM: INSULIN: 6.5 u[IU]/mL (ref 2.6–24.9)

## 2022-09-12 DIAGNOSIS — F439 Reaction to severe stress, unspecified: Secondary | ICD-10-CM | POA: Insufficient documentation

## 2022-09-12 DIAGNOSIS — R5383 Other fatigue: Secondary | ICD-10-CM | POA: Insufficient documentation

## 2022-09-12 NOTE — Progress Notes (Unsigned)
Chief Complaint:   OBESITY [name] is here to discuss her progress with her obesity treatment plan along with follow-up of her obesity related diagnoses. [name] is on the Category 2 Plan and states she is following her eating plan approximately 50% of the time. [name] states she is walking 30 minutes 3-4 times per week.  Today's visit was #: 25 Starting weight: 183 lbs Starting date: 05/28/2022 Today's weight: 157 lbs Today's date: 09/09/2022 Total lbs lost to date: 26 lbs Total lbs lost since last in-office visit: +3 lbs  Interim History: Last 6 weeks ***  Subjective:   1. Pre-diabetes She is currently on metformin XR 750 mg BID with meals.   2. Other fatigue She reports increased fatigue, poor sleep, increased anxiety level for the last 6 weeks. No suicidal or homicidal ideations.   3. Other specified hypothyroidism Her PCP manages daily levothyroxine 100 mcg.  She endorses hot/cold intolerance, rare palpitations.  She denies any chest pain or dyspnea.  4. Stress, let down *** She has never been on any antidepressants or anxiolytics.   5. Mixed hyperlipidemia She is not on statin therapy, she is unable to tolerate. She denies tobacco or vaping use.  03/16/2022, CT cardiac scoring = 0.   Assessment/Plan:   1. Pre-diabetes Refill - metFORMIN (GLUCOPHAGE-XR) 750 MG 24 hr tablet; 1 tab with first meal and 1 tab with dinner  Dispense: 60 tablet; Refill: 0  Check labs today.   - Comprehensive metabolic panel - Hemoglobin A1c - Insulin, random  2. Other fatigue Check labs today.   - VITAMIN D 25 Hydroxy (Vit-D Deficiency, Fractures) - Vitamin B12 - TSH + free T4 - CBC w/Diff/Platelet  3. Other specified hypothyroidism Check labs today.   - TSH + free T4  4. Stress, let down Referral to behavior health/psychology.   Referral - Ambulatory referral to Psychology  5. Mixed hyperlipidemia Check labs today.   - Lipid panel  6. Obesity with current  BMI 31.3 [name] is currently in the action stage of change. As such, her goal is to continue with weight loss efforts. She has agreed to the Category 2 Plan.   Exercise goals:  As is.   Behavioral modification strategies: increasing lean protein intake, decreasing simple carbohydrates, meal planning and cooking strategies, keeping healthy foods in the home, and planning for success.  [name] has agreed to follow-up with our clinic in 4 weeks. She was informed of the importance of frequent follow-up visits to maximize her success with intensive lifestyle modifications for her multiple health conditions.   [name] was informed we would discuss her lab results at her next visit unless there is a critical issue that needs to be addressed sooner. [name] agreed to keep her next visit at the agreed upon time to discuss these results.  Objective:   Blood pressure 135/84, pulse 71, temperature 97.9 F (36.6 C), height 5' (1.524 m), weight 160 lb (72.6 kg), SpO2 97 %. Body mass index is 31.25 kg/m.  General: Cooperative, alert, well developed, in no acute distress. HEENT: Conjunctivae and lids unremarkable. Cardiovascular: Regular rhythm.  Lungs: Normal work of breathing. Neurologic: No focal deficits.   Lab Results  Component Value Date   CREATININE 0.85 09/09/2022   BUN 15 09/09/2022   NA 140 09/09/2022   K 4.6 09/09/2022   CL 102 09/09/2022   CO2 25 09/09/2022   Lab Results  Component Value Date   ALT 22 09/09/2022   AST 21 09/09/2022  ALKPHOS 64 09/09/2022   BILITOT 0.4 09/09/2022   Lab Results  Component Value Date   HGBA1C 5.6 09/09/2022   HGBA1C 5.6 04/20/2022   HGBA1C 5.7 (H) 01/08/2022   HGBA1C 5.6 07/22/2021   HGBA1C 5.7 (H) 01/03/2021   Lab Results  Component Value Date   INSULIN 6.5 09/09/2022   INSULIN 7.9 04/20/2022   INSULIN 7.7 01/08/2022   INSULIN 12.0 04/03/2020   Lab Results  Component Value Date   TSH 0.234 (L) 09/09/2022   Lab Results   Component Value Date   CHOL 259 (H) 09/09/2022   HDL 62 09/09/2022   LDLCALC 145 (H) 09/09/2022   TRIG 287 (H) 09/09/2022   CHOLHDL 4.2 09/09/2022   Lab Results  Component Value Date   VD25OH 32.7 09/09/2022   VD25OH 37.1 04/20/2022   VD25OH 31.5 01/08/2022   Lab Results  Component Value Date   WBC 6.6 09/09/2022   HGB 15.0 09/09/2022   HCT 43.8 09/09/2022   MCV 97 09/09/2022   PLT 354 09/09/2022   No results found for: "IRON", "TIBC", "FERRITIN"  Attestation Statements:   Reviewed by clinician on day of visit: allergies, medications, problem list, medical history, surgical history, family history, social history, and previous encounter notes.  I, Malcolm Metro, RMA, am acting as Energy manager for William Hamburger, NP.  I have reviewed the above documentation for accuracy and completeness, and I agree with the above. -  ***

## 2022-09-22 ENCOUNTER — Ambulatory Visit: Payer: BC Managed Care – PPO | Admitting: Neurology

## 2022-10-03 ENCOUNTER — Other Ambulatory Visit (INDEPENDENT_AMBULATORY_CARE_PROVIDER_SITE_OTHER): Payer: Self-pay | Admitting: Adult Health

## 2022-10-03 DIAGNOSIS — R7303 Prediabetes: Secondary | ICD-10-CM

## 2022-10-05 ENCOUNTER — Telehealth (INDEPENDENT_AMBULATORY_CARE_PROVIDER_SITE_OTHER): Payer: Self-pay

## 2022-10-05 NOTE — Telephone Encounter (Signed)
Submitted an appeal on behalf of patient for Saxenda. It requires a signed consent letter from patient stating, they give [place] permission to pursue the appeal on their behalf. It must be on our letterhead and faxed to [phone]. This consent letter has not been received or faxed at this time but I did submit the appeal and stated the letter would be faxed separately.

## 2022-10-07 ENCOUNTER — Ambulatory Visit (INDEPENDENT_AMBULATORY_CARE_PROVIDER_SITE_OTHER): Payer: BC Managed Care – PPO | Admitting: Family Medicine

## 2022-10-07 ENCOUNTER — Other Ambulatory Visit (INDEPENDENT_AMBULATORY_CARE_PROVIDER_SITE_OTHER): Payer: Self-pay | Admitting: Adult Health

## 2022-10-07 DIAGNOSIS — R7303 Prediabetes: Secondary | ICD-10-CM

## 2022-10-12 ENCOUNTER — Ambulatory Visit: Payer: BC Managed Care – PPO | Admitting: Neurology

## 2022-10-15 ENCOUNTER — Ambulatory Visit (INDEPENDENT_AMBULATORY_CARE_PROVIDER_SITE_OTHER): Payer: BC Managed Care – PPO | Admitting: Family Medicine

## 2022-10-15 ENCOUNTER — Encounter (INDEPENDENT_AMBULATORY_CARE_PROVIDER_SITE_OTHER): Payer: Self-pay

## 2022-10-26 ENCOUNTER — Ambulatory Visit: Payer: BC Managed Care – PPO | Admitting: Family Medicine

## 2022-10-26 ENCOUNTER — Encounter: Payer: Self-pay | Admitting: Family Medicine

## 2022-10-26 VITALS — BP 118/76 | HR 75 | Temp 98.4°F | Ht 60.0 in | Wt 165.6 lb

## 2022-10-26 DIAGNOSIS — Z23 Encounter for immunization: Secondary | ICD-10-CM | POA: Diagnosis not present

## 2022-10-26 DIAGNOSIS — E039 Hypothyroidism, unspecified: Secondary | ICD-10-CM

## 2022-10-26 LAB — TSH: TSH: 2.75 u[IU]/mL (ref 0.35–5.50)

## 2022-10-26 NOTE — Patient Instructions (Signed)
Thanks for coming in today.  No med changes for now.  9-month follow-up for labs.  If any concerns on labs today I will let you know but expect TSH will be back in the normal range.  Take care.

## 2022-10-26 NOTE — Progress Notes (Signed)
Subjective:  Patient ID: [name], female    DOB: 1961-06-19  Age: 61 y.o. MRN: [id number]  CC:  Chief Complaint  Patient presents with   Hypothyroidism    Pt notes heart palps and anxiety, notes since the last visit has been better     HPI [name] presents for   Hypothyroidism: Lab Results  Component Value Date   TSH 0.234 (L) 09/09/2022  Taking medication daily.  Changed from 112 mcg to 88 mcg after low TSH October 25.  She was symptomatic at that time with some palpitations, noticed previous 6 weeks.  Similar symptoms in the past with overtreated thyroid. Since medication change feels much better. No further heart palpitations, anxiety or insomnia. No new hot or cold intolerance. No new hair or skin changes, heart palpitations or new fatigue. No new weight changes   HM:  Shingrix #2 today.   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   Anemia    Back pain    Blood transfusion without reported diagnosis    post-operatively in 20s after L1 fracture with spinal cord injury after horseback riding.   Chronic kidney disease    Tuberous Sclerosis; [facility] at [facility].   Edema of both lower extremities    Hyperlipidemia    Hypertension    Hypothyroidism    Lower back pain    Pre-diabetes    Thyroid disease    Vitamin D deficiency    Past Surgical History:  Procedure Laterality Date   ABDOMINAL HYSTERECTOMY      DUB; ovaries remaining.   CESAREAN SECTION     MENISCUS REPAIR     Sleep Study  12/17/2010   No sleep disordered breathing or periodic limb mvmets.  Excess light sleep.   SPINE SURGERY     L1 fracture with spinal cord injury with horseback riding.   TUBAL LIGATION     Allergies  Allergen Reactions   Saxenda [Liraglutide -Weight Management] Nausea And Vomiting    With borderline high lipase - concern for possible pancreatitis.    Sulfa Antibiotics Hives   Lipitor [Atorvastatin] Other (See Comments)    Fatigue and confusion   Prior to Admission medications   Medication Sig Start Date End Date Taking? Authorizing Provider  enalapril (VASOTEC) 20 MG tablet TAKE 1 TABLET BY MOUTH EVERY DAY 07/17/22  Yes [name], MD  levothyroxine (SYNTHROID) 88 MCG tablet Take 1 tablet (88 mcg total) by mouth daily. 09/10/22  Yes [name], MD  meloxicam (MOBIC) 7.5 MG tablet Take 1 tablet (7.5 mg total) by mouth daily as needed for pain. 05/29/21  Yes [name], MD  metFORMIN (GLUCOPHAGE-XR) 750 MG 24 hr tablet 1 tab with first meal and 1 tab with dinner 09/09/22  Yes Danford, Orpha Bur D, NP  Multiple Vitamins-Minerals (CENTRUM ADULTS PO) Take by mouth.   Yes [provider]   Social History   Socioeconomic  History   Marital status: Married    Spouse name: Burnis Kingfisher   Number of children: Not on file   Years of education: Not on file   Highest education level: Not on file  Occupational History   Occupation: Tourist information centre manager  Tobacco Use   Smoking status: Never   Smokeless tobacco: Never  Vaping Use   Vaping Use: Never used  Substance and Sexual Activity   Alcohol use: Yes    Comment: occ   Drug use: No   Sexual activity: Yes    Birth control/protection: None  Other Topics Concern   Not on file  Social History Narrative   Marital status: married x 20+ years; happily married.      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      Tobacco; none      Alcohol:  Socially      Drugs: none      Exercise: sporadic   Social Determinants of Health   Financial Resource Strain: Not on file  Food Insecurity: Not on file  Transportation Needs: Not on file  Physical Activity: Not on file  Stress: Not on file  Social Connections: Not on file  Intimate Partner Violence: Not on file    Review of Systems   Objective:   Vitals:   10/26/22 1114  BP: 118/76  Pulse: 75  Temp: 98.4 F (36.9 C)  TempSrc: Oral  SpO2: 98%  Weight: 165 lb 9.6 oz (75.1 kg)  Height: 5' (1.524 m)     Physical Exam Vitals reviewed.  Constitutional:      Appearance: Normal appearance. She is well-developed.  HENT:     Head: Normocephalic and atraumatic.  Eyes:     Conjunctiva/sclera: Conjunctivae normal.     Pupils: Pupils are equal, round, and reactive to light.  Neck:     Vascular: No carotid bruit.     Comments: No nodules appreciated Cardiovascular:     Rate and Rhythm: Normal rate and regular rhythm.     Heart sounds: Normal heart sounds.  Pulmonary:     Effort: Pulmonary effort is normal.     Breath sounds: Normal breath sounds.  Abdominal:     Palpations: Abdomen is soft. There is no pulsatile mass.     Tenderness: There is no abdominal tenderness.  Musculoskeletal:     Cervical back: No tenderness.     Right lower leg: No edema.     Left lower leg: No edema.  Skin:    General: Skin is warm and dry.  Neurological:     Mental Status: She is alert and oriented to person, place, and time.  Psychiatric:        Mood and Affect: Mood normal.        Behavior: Behavior normal.        Assessment & Plan:  [name] is a 61 y.o. female . Hypothyroidism, unspecified type - Plan: TSH  -Symptoms have improved, check TSH, continue same dose Synthroid 88 mcg for now with 22-month follow-up if stable.  Need for shingles vaccine - Plan: Varicella-zoster vaccine  IM Second dose given.  Series completed.  No orders of the defined types were placed in this encounter.  Patient Instructions  Thanks for coming in today.  No med changes for now.  82-month follow-up for labs.  If any concerns on labs today  I will let you know but expect TSH will be back in the normal range.  Take care.     Signed,   Merri Ray, MD [facility] 10/26/22 11:44 AM

## 2022-11-17 ENCOUNTER — Encounter (INDEPENDENT_AMBULATORY_CARE_PROVIDER_SITE_OTHER): Payer: Self-pay | Admitting: Adult Health

## 2022-11-17 ENCOUNTER — Ambulatory Visit (INDEPENDENT_AMBULATORY_CARE_PROVIDER_SITE_OTHER): Payer: BC Managed Care – PPO | Admitting: Adult Health

## 2022-11-17 VITALS — BP 136/82 | HR 69 | Temp 98.0°F | Ht 60.0 in | Wt 166.0 lb

## 2022-11-17 DIAGNOSIS — E669 Obesity, unspecified: Secondary | ICD-10-CM | POA: Diagnosis not present

## 2022-11-17 DIAGNOSIS — Z6832 Body mass index (BMI) 32.0-32.9, adult: Secondary | ICD-10-CM | POA: Diagnosis not present

## 2022-11-17 DIAGNOSIS — E038 Other specified hypothyroidism: Secondary | ICD-10-CM

## 2022-11-17 DIAGNOSIS — R7303 Prediabetes: Secondary | ICD-10-CM

## 2022-11-17 DIAGNOSIS — E039 Hypothyroidism, unspecified: Secondary | ICD-10-CM

## 2022-11-18 NOTE — Progress Notes (Unsigned)
Chief Complaint:   OBESITY [name] is here to discuss her progress with her obesity treatment plan along with follow-up of her obesity related diagnoses. [name] is on the Category 2 Plan and states she is following her eating plan approximately 25% of the time. [name] states she is walking 30 minutes 3 times per week.  Today's visit was #: 37 Starting weight: 183 LBS Starting date: 05/28/2022 Today's weight: 166 LBS Today's date: 11/17/2022 Total lbs lost to date: 17 LBS Total lbs lost since last in-office visit: +6 LBS  Interim History: ***  Subjective:   1. Other specified hypothyroidism ***  2. Pre-diabetes A1c***  Inconsistent use of metformin XR 750 twice daily usually 0-1 times per day.  Assessment/Plan:   1. Other specified hypothyroidism Continue levothyroxine 88 mcg daily.  2. Pre-diabetes Continue metformin XR 750 mg twice daily, no refill  3. Obesity with current BMI 32.5 [name] is currently in the action stage of change. As such, her goal is to continue with weight loss efforts. She has agreed to keeping a food journal and adhering to recommended goals of 1200 calories and 90 protein.   Exercise goals:  Add 30 minutes of movement per day and 10 minutes of strength training 2 times per week.  Behavioral modification strategies: increasing lean protein intake, decreasing simple carbohydrates, ways to avoid boredom eating, better snacking choices, and planning for success.  [name] has agreed to follow-up with our clinic in 2 weeks. She was informed of the importance of frequent follow-up visits to maximize her success with intensive lifestyle modifications for her multiple health conditions.   Objective:   Blood pressure 136/82, pulse 69, temperature 98 F (36.7 C), height 5' (1.524 m), weight 166 lb (75.3 kg), SpO2 98 %. Body mass index is 32.42 kg/m.  General: Cooperative, alert, well developed, in no acute distress. HEENT: Conjunctivae and  lids unremarkable. Cardiovascular: Regular rhythm.  Lungs: Normal work of breathing. Neurologic: No focal deficits.   Lab Results  Component Value Date   CREATININE 0.85 09/09/2022   BUN 15 09/09/2022   NA 140 09/09/2022   K 4.6 09/09/2022   CL 102 09/09/2022   CO2 25 09/09/2022   Lab Results  Component Value Date   ALT 22 09/09/2022   AST 21 09/09/2022   ALKPHOS 64 09/09/2022   BILITOT 0.4 09/09/2022   Lab Results  Component Value Date   HGBA1C 5.6 09/09/2022   HGBA1C 5.6 04/20/2022   HGBA1C 5.7 (H) 01/08/2022   HGBA1C 5.6 07/22/2021   HGBA1C 5.7 (H) 01/03/2021   Lab Results  Component Value Date   INSULIN 6.5 09/09/2022   INSULIN 7.9 04/20/2022   INSULIN 7.7 01/08/2022   INSULIN 12.0 04/03/2020   Lab Results  Component Value Date   TSH 2.75 10/26/2022   Lab Results  Component Value Date   CHOL 259 (H) 09/09/2022   HDL 62 09/09/2022   LDLCALC 145 (H) 09/09/2022   TRIG 287 (H) 09/09/2022   CHOLHDL 4.2 09/09/2022   Lab Results  Component Value Date   VD25OH 32.7 09/09/2022   VD25OH 37.1 04/20/2022   VD25OH 31.5 01/08/2022   Lab Results  Component Value Date   WBC 6.6 09/09/2022   HGB 15.0 09/09/2022   HCT 43.8 09/09/2022   MCV 97 09/09/2022   PLT 354 09/09/2022   No results found for: "IRON", "TIBC", "FERRITIN"  Attestation Statements:   Reviewed by clinician on day of visit: allergies, medications, problem list, medical history,  surgical history, family history, social history, and previous encounter notes.  I, Davy Pique, RMA, am acting as Location manager for Mina Marble, NP.  I have reviewed the above documentation for accuracy and completeness, and I agree with the above. -  ***

## 2022-12-02 ENCOUNTER — Ambulatory Visit (INDEPENDENT_AMBULATORY_CARE_PROVIDER_SITE_OTHER): Payer: BC Managed Care – PPO | Admitting: Adult Health

## 2022-12-02 ENCOUNTER — Encounter (INDEPENDENT_AMBULATORY_CARE_PROVIDER_SITE_OTHER): Payer: Self-pay | Admitting: Adult Health

## 2022-12-02 VITALS — BP 127/80 | HR 64 | Temp 97.8°F | Ht 60.0 in | Wt 164.0 lb

## 2022-12-02 DIAGNOSIS — E559 Vitamin D deficiency, unspecified: Secondary | ICD-10-CM | POA: Diagnosis not present

## 2022-12-02 DIAGNOSIS — R7303 Prediabetes: Secondary | ICD-10-CM | POA: Diagnosis not present

## 2022-12-02 DIAGNOSIS — Z6832 Body mass index (BMI) 32.0-32.9, adult: Secondary | ICD-10-CM

## 2022-12-02 DIAGNOSIS — E669 Obesity, unspecified: Secondary | ICD-10-CM

## 2022-12-02 DIAGNOSIS — E039 Hypothyroidism, unspecified: Secondary | ICD-10-CM

## 2022-12-02 DIAGNOSIS — Z6835 Body mass index (BMI) 35.0-35.9, adult: Secondary | ICD-10-CM

## 2022-12-02 DIAGNOSIS — E038 Other specified hypothyroidism: Secondary | ICD-10-CM | POA: Diagnosis not present

## 2022-12-02 MED ORDER — VITAMIN D (ERGOCALCIFEROL) 1.25 MG (50000 UNIT) PO CAPS: 50000.0000 [IU] | ORAL_CAPSULE | ORAL | 0 refills | Status: DC

## 2022-12-02 MED ORDER — METFORMIN HCL ER 750 MG PO TB24: ORAL_TABLET | ORAL | 0 refills | Status: DC

## 2022-12-09 NOTE — Progress Notes (Signed)
Chief Complaint:   OBESITY [name] is here to discuss her progress with her obesity treatment plan along with follow-up of her obesity related diagnoses. [name] is on keeping a food journal and adhering to recommended goals of 1200 calories and 90 protein and states she is following her eating plan approximately 85% of the time. [name] states she is movement, walking, strength 10 to 30 minutes 2-7 times per week.  Today's visit was #: 2 Starting weight: 183 LBS Starting date: 05/28/2022 Today's weight: 164 LBS Today's date: 12/02/2022 Total lbs lost to date: 19 LBS Total lbs lost since last in-office visit: 2 LBS  Interim History:  Since last OV- she has eliminated all processed foods. She shares that she has enjoyed increased satiety, decreased HA sx's, and "feeling better in general".  Of Note- Interesting she reports that she is HA free when she travels in [place].  Subjective:   1. Pre-diabetes Lab Results  Component Value Date   HGBA1C 5.6 09/09/2022   HGBA1C 5.6 04/20/2022   HGBA1C 5.7 (H) 01/08/2022    09/09/2022, CMP/GFR 77.  Patient is currently on metformin XR 750 mg with first meal and with dinner. She denies loose stools or abdominal cramping.  2. Vitamin D deficiency Patient is taking OTC multivitamin Centrum daily. She endorses stable energy levels.  3. Other specified hypothyroidism PCP manages levothyroxine, currently on levothyroxine 82 mcg daily.   Patient is positive for cold feet. Negative for palpitations and anxiety.  Assessment/Plan:   1. Pre-diabetes Refill- metFORMIN (GLUCOPHAGE-XR) 750 MG 24 hr tablet; 1 tab with first meal and 1 tab with dinner  Dispense: 60 tablet; Refill: 0  2. Vitamin D deficiency Stop OTC multivitamin.  Check labs at next visit.  Start- Vitamin D, Ergocalciferol, (DRISDOL) 1.25 MG (50000 UNIT) CAPS capsule; Take 1 capsule (50,000 Units total) by mouth every 7 (seven) days.  Dispense: 4 capsule; Refill: 0  3.  Other specified hypothyroidism Check labs at next office visit.  4. Obesity with current BMI 32.0 [name] is currently in the action stage of change. As such, her goal is to continue with weight loss efforts. She has agreed to keeping a food journal and adhering to recommended goals of 1200 calories and 90 protein.   Exercise goals:  As is.  Behavioral modification strategies: increasing lean protein intake, decreasing simple carbohydrates, meal planning and cooking strategies, keeping healthy foods in the home, and planning for success.  Analilia has agreed to follow-up with our clinic in 2 weeks. She was informed of the importance of frequent follow-up visits to maximize her success with intensive lifestyle modifications for her multiple health conditions.   Objective:   Blood pressure 127/80, pulse 64, temperature 97.8 F (36.6 C), height 5' (1.524 m), weight 164 lb (74.4 kg), SpO2 100 %. Body mass index is 32.03 kg/m.  General: Cooperative, alert, well developed, in no acute distress. HEENT: Conjunctivae and lids unremarkable. Cardiovascular: Regular rhythm.  Lungs: Normal work of breathing. Neurologic: No focal deficits.   Lab Results  Component Value Date   CREATININE 0.85 09/09/2022   BUN 15 09/09/2022   NA 140 09/09/2022   K 4.6 09/09/2022   CL 102 09/09/2022   CO2 25 09/09/2022   Lab Results  Component Value Date   ALT 22 09/09/2022   AST 21 09/09/2022   ALKPHOS 64 09/09/2022   BILITOT 0.4 09/09/2022   Lab Results  Component Value Date   HGBA1C 5.6 09/09/2022   HGBA1C 5.6 04/20/2022  HGBA1C 5.7 (H) 01/08/2022   HGBA1C 5.6 07/22/2021   HGBA1C 5.7 (H) 01/03/2021   Lab Results  Component Value Date   INSULIN 6.5 09/09/2022   INSULIN 7.9 04/20/2022   INSULIN 7.7 01/08/2022   INSULIN 12.0 04/03/2020   Lab Results  Component Value Date   TSH 2.75 10/26/2022   Lab Results  Component Value Date   CHOL 259 (H) 09/09/2022   HDL 62 09/09/2022   LDLCALC  145 (H) 09/09/2022   TRIG 287 (H) 09/09/2022   CHOLHDL 4.2 09/09/2022   Lab Results  Component Value Date   VD25OH 32.7 09/09/2022   VD25OH 37.1 04/20/2022   VD25OH 31.5 01/08/2022   Lab Results  Component Value Date   WBC 6.6 09/09/2022   HGB 15.0 09/09/2022   HCT 43.8 09/09/2022   MCV 97 09/09/2022   PLT 354 09/09/2022   No results found for: "IRON", "TIBC", "FERRITIN"  Attestation Statements:   Reviewed by clinician on day of visit: allergies, medications, problem list, medical history, surgical history, family history, social history, and previous encounter notes.  I, Davy Pique, RMA, am acting as Location manager for Mina Marble, NP.  I have reviewed the above documentation for accuracy and completeness, and I agree with the above. - Jerime Arif d. Kimie Pidcock, NP-C

## 2022-12-16 ENCOUNTER — Ambulatory Visit (INDEPENDENT_AMBULATORY_CARE_PROVIDER_SITE_OTHER): Payer: BC Managed Care – PPO | Admitting: Adult Health

## 2022-12-16 ENCOUNTER — Encounter (INDEPENDENT_AMBULATORY_CARE_PROVIDER_SITE_OTHER): Payer: Self-pay | Admitting: Adult Health

## 2022-12-16 VITALS — BP 131/85 | HR 62 | Temp 97.8°F | Ht 60.0 in | Wt 163.0 lb

## 2022-12-16 DIAGNOSIS — E559 Vitamin D deficiency, unspecified: Secondary | ICD-10-CM

## 2022-12-16 DIAGNOSIS — E669 Obesity, unspecified: Secondary | ICD-10-CM | POA: Diagnosis not present

## 2022-12-16 DIAGNOSIS — R7303 Prediabetes: Secondary | ICD-10-CM | POA: Diagnosis not present

## 2022-12-16 DIAGNOSIS — I10 Essential (primary) hypertension: Secondary | ICD-10-CM | POA: Diagnosis not present

## 2022-12-16 DIAGNOSIS — Z6831 Body mass index (BMI) 31.0-31.9, adult: Secondary | ICD-10-CM

## 2022-12-17 LAB — COMPREHENSIVE METABOLIC PANEL
ALT: 18 IU/L (ref 0–32)
AST: 15 IU/L (ref 0–40)
Albumin/Globulin Ratio: 2.5 — ABNORMAL HIGH (ref 1.2–2.2)
Albumin: 5 g/dL — ABNORMAL HIGH (ref 3.9–4.9)
Alkaline Phosphatase: 62 IU/L (ref 44–121)
BUN/Creatinine Ratio: 26 (ref 12–28)
BUN: 21 mg/dL (ref 8–27)
Bilirubin Total: 0.5 mg/dL (ref 0.0–1.2)
CO2: 24 mmol/L (ref 20–29)
Calcium: 10 mg/dL (ref 8.7–10.3)
Chloride: 102 mmol/L (ref 96–106)
Creatinine, Ser: 0.81 mg/dL (ref 0.57–1.00)
Globulin, Total: 2 g/dL (ref 1.5–4.5)
Glucose: 82 mg/dL (ref 70–99)
Potassium: 4.5 mmol/L (ref 3.5–5.2)
Sodium: 141 mmol/L (ref 134–144)
Total Protein: 7 g/dL (ref 6.0–8.5)
eGFR: 82 mL/min/{1.73_m2} (ref >59)

## 2022-12-17 LAB — VITAMIN B12: Vitamin B-12: 652 pg/mL (ref 232–1245)

## 2022-12-17 LAB — VITAMIN D 25 HYDROXY (VIT D DEFICIENCY, FRACTURES): Vit D, 25-Hydroxy: 37.9 ng/mL (ref 30.0–100.0)

## 2022-12-17 LAB — INSULIN, RANDOM: INSULIN: 6.5 u[IU]/mL (ref 2.6–24.9)

## 2022-12-17 LAB — HEMOGLOBIN A1C
Est. average glucose Bld gHb Est-mCnc: 114 mg/dL
Hgb A1c MFr Bld: 5.6 % (ref 4.8–5.6)

## 2022-12-24 ENCOUNTER — Other Ambulatory Visit (INDEPENDENT_AMBULATORY_CARE_PROVIDER_SITE_OTHER): Payer: Self-pay | Admitting: Adult Health

## 2022-12-24 DIAGNOSIS — R7303 Prediabetes: Secondary | ICD-10-CM

## 2022-12-28 ENCOUNTER — Other Ambulatory Visit (INDEPENDENT_AMBULATORY_CARE_PROVIDER_SITE_OTHER): Payer: Self-pay | Admitting: Adult Health

## 2022-12-28 DIAGNOSIS — R7303 Prediabetes: Secondary | ICD-10-CM

## 2022-12-29 NOTE — Progress Notes (Signed)
Chief Complaint:   OBESITY [name] is here to discuss her progress with her obesity treatment plan along with follow-up of her obesity related diagnoses. [name] is on keeping a food journal and adhering to recommended goals of 1200 calories and 90 protein and states she is following her eating plan approximately 60% of the time. [name] states she is walking and strength training 10-30 minutes 2-5 times per week.  Today's visit was #: 5 Starting weight: 183 LBS Starting date: 05/28/2022 Today's weight: 163 LBS Today's date: 12/16/2022 Total lbs lost to date: 20 LBS Total lbs lost since last in-office visit: 1 LB  Interim History:  [employer] goals for winter 2024 1) 2 lb weight loss per OV 2) Continue to reduce/limit processed foods  3) Finalize retirement plans this semester  Subjective:   1. Essential hypertension Patient blood pressure is stable at office visit.  On 09/09/2022, CMP, electrolytes, kidney function stable.  Patient denies chest pain with exertion.  She is currently on Vasotec 20 mg daily.  2. Vitamin D deficiency On 09/09/2022 vitamin D level was 32.7.  Patient is taking weekly ergocalciferol 50,000 IU.  Patient denies nausea, vomiting, muscle weakness.  3. Pre-diabetes Lab Results  Component Value Date   HGBA1C 5.6 12/16/2022   HGBA1C 5.6 09/09/2022   HGBA1C 5.6 04/20/2022    Patient is currently on metformin XR with first meal and with dinner.  Assessment/Plan:   1. Essential hypertension Continue regular exercise, healthy eating, daily Vasotec 20 mg.  Check labs today.  - Comprehensive metabolic panel  2. Vitamin D deficiency Check labs today.  - VITAMIN D 25 Hydroxy (Vit-D Deficiency, Fractures)  3. Pre-diabetes Check labs today.  - Hemoglobin A1c - Insulin, random - Vitamin B12  4. Obesity with current BMI 31.9 [name] is currently in the action stage of change. As such, her goal is to continue with weight loss efforts.  She has agreed to keeping a food journal and adhering to recommended goals of 1200 calories and 90 protein.   Exercise goals:  As is.  Behavioral modification strategies: increasing lean protein intake, decreasing simple carbohydrates, keeping healthy foods in the home, ways to avoid boredom eating, and planning for success.  [name] has agreed to follow-up with our clinic in 2 weeks. She was informed of the importance of frequent follow-up visits to maximize her success with intensive lifestyle modifications for her multiple health conditions.   [name] was informed we would discuss her lab results at her next visit unless there is a critical issue that needs to be addressed sooner. [name] agreed to keep her next visit at the agreed upon time to discuss these results.  Objective:   Blood pressure 131/85, pulse 62, temperature 97.8 F (36.6 C), height 5' (1.524 m), weight 163 lb (73.9 kg), SpO2 97 %. Body mass index is 31.83 kg/m.  General: Cooperative, alert, well developed, in no acute distress. HEENT: Conjunctivae and lids unremarkable. Cardiovascular: Regular rhythm.  Lungs: Normal work of breathing. Neurologic: No focal deficits.   Lab Results  Component Value Date   CREATININE 0.81 12/16/2022   BUN 21 12/16/2022   NA 141 12/16/2022   K 4.5 12/16/2022   CL 102 12/16/2022   CO2 24 12/16/2022   Lab Results  Component Value Date   ALT 18 12/16/2022   AST 15 12/16/2022   ALKPHOS 62 12/16/2022   BILITOT 0.5 12/16/2022   Lab Results  Component Value Date   HGBA1C 5.6 12/16/2022  HGBA1C 5.6 09/09/2022   HGBA1C 5.6 04/20/2022   HGBA1C 5.7 (H) 01/08/2022   HGBA1C 5.6 07/22/2021   Lab Results  Component Value Date   INSULIN 6.5 12/16/2022   INSULIN 6.5 09/09/2022   INSULIN 7.9 04/20/2022   INSULIN 7.7 01/08/2022   INSULIN 12.0 04/03/2020   Lab Results  Component Value Date   TSH 2.75 10/26/2022   Lab Results  Component Value Date   CHOL 259 (H) 09/09/2022    HDL 62 09/09/2022   LDLCALC 145 (H) 09/09/2022   TRIG 287 (H) 09/09/2022   CHOLHDL 4.2 09/09/2022   Lab Results  Component Value Date   VD25OH 37.9 12/16/2022   VD25OH 32.7 09/09/2022   VD25OH 37.1 04/20/2022   Lab Results  Component Value Date   WBC 6.6 09/09/2022   HGB 15.0 09/09/2022   HCT 43.8 09/09/2022   MCV 97 09/09/2022   PLT 354 09/09/2022   No results found for: "IRON", "TIBC", "FERRITIN"  Attestation Statements:   Reviewed by clinician on day of visit: allergies, medications, problem list, medical history, surgical history, family history, social history, and previous encounter notes.  I, Davy Pique, RMA, am acting as Location manager for Mina Marble, NP.  I have reviewed the above documentation for accuracy and completeness, and I agree with the above. -  Teondra Newburg d. Samier Jaco, NP-C

## 2022-12-30 ENCOUNTER — Ambulatory Visit (INDEPENDENT_AMBULATORY_CARE_PROVIDER_SITE_OTHER): Payer: BC Managed Care – PPO | Admitting: Adult Health

## 2022-12-30 ENCOUNTER — Encounter (INDEPENDENT_AMBULATORY_CARE_PROVIDER_SITE_OTHER): Payer: Self-pay | Admitting: Adult Health

## 2022-12-30 VITALS — BP 129/76 | HR 61 | Temp 97.7°F | Ht 60.0 in | Wt 165.0 lb

## 2022-12-30 DIAGNOSIS — E669 Obesity, unspecified: Secondary | ICD-10-CM

## 2022-12-30 DIAGNOSIS — I10 Essential (primary) hypertension: Secondary | ICD-10-CM | POA: Diagnosis not present

## 2022-12-30 DIAGNOSIS — R7303 Prediabetes: Secondary | ICD-10-CM

## 2022-12-30 DIAGNOSIS — E559 Vitamin D deficiency, unspecified: Secondary | ICD-10-CM | POA: Diagnosis not present

## 2022-12-30 DIAGNOSIS — Z6835 Body mass index (BMI) 35.0-35.9, adult: Secondary | ICD-10-CM

## 2022-12-30 DIAGNOSIS — Z6832 Body mass index (BMI) 32.0-32.9, adult: Secondary | ICD-10-CM

## 2022-12-30 DIAGNOSIS — E66811 Obesity, class 1: Secondary | ICD-10-CM

## 2022-12-30 MED ORDER — VITAMIN D (ERGOCALCIFEROL) 1.25 MG (50000 UNIT) PO CAPS: 50000.0000 [IU] | ORAL_CAPSULE | ORAL | 0 refills | Status: DC

## 2022-12-30 MED ORDER — METFORMIN HCL ER 750 MG PO TB24: ORAL_TABLET | ORAL | 0 refills | Status: DC

## 2022-12-30 NOTE — Progress Notes (Signed)
Chief Complaint:   OBESITY [name] is here to discuss her progress with her obesity treatment plan along with follow-up of her obesity related diagnoses. [name] is on keeping a food journal and adhering to recommended goals of 1200 calories and 90 protein and states she is following her eating plan approximately 60% of the time.  [name] states she is walking 30 minutes 5 times per week.  Today's visit was #: 72 Starting weight: 177 lbs Starting date: 04/03/2020 Today's weight: 165 lbs Today's date: 12/30/2022 Total lbs lost to date: 12 lbs Total lbs lost since last in-office visit: + 2 lbs  Interim History:  Life Stressors since last OV- Elderly Mother (age 71) concerned about end of life planning- she and her siblings have been assisting with financial and medical planning. [employer] offered "acceptable adjacent offer" - she has decided to decline offer and officially retire 01/14/23!!!! Home furnace malfunction- been without heat since 12/21/2022. New furnace and [facility] unit being installed today!  She states "I want less body for my  skeleton to haul around".  April 12-Mid May [place] Excursion   Subjective:   1. Vitamin D deficiency Discussed labs  Latest Reference Range & Units 12/16/22 11:58  Vitamin D, 25-Hydroxy 30.0 - 100.0 ng/mL 37.9  Subtherapeutic level on weekly Ergocalciferol  2. Pre-diabetes Discussed labs Lab Results  Component Value Date   HGBA1C 5.6 12/16/2022   HGBA1C 5.6 09/09/2022   HGBA1C 5.6 04/20/2022  12/16/22 CMP GFR- 82 Currently on Metformin XR 730m 1 tab with first meal of day and 1 tab with dinner  Of Note- Saxenda caused N/V/elevated Lipase  3. Essential hypertension 12/16/22 CMP- Electrolytes and Kidney fx both stable  Currently on daily Vasotec 274m  Assessment/Plan:   1. Vitamin D deficiency Refill and increase Ergocalciferol 50,000 IU Q3 days Disp 8  RF 0  2. Pre-diabetes Refill Metformin XR 75010m tab with first meal of day and  1 tab with dinner Disp 60 RF 0  3. Essential hypertension Continue daily Vasotec 4m29mealthy eating, and regular walking.  4. Obesity with current BMI 32.3  MargAshiahcurrently in the action stage of change. As such, her goal is to continue with weight loss efforts. She has agreed to keeping a food journal and adhering to recommended goals of 1200 calories and 90 protein.   Exercise goals: For substantial health benefits, adults should do at least 150 minutes (2 hours and 30 minutes) a week of moderate-intensity, or 75 minutes (1 hour and 15 minutes) a week of vigorous-intensity aerobic physical activity, or an equivalent combination of moderate- and vigorous-intensity aerobic activity. Aerobic activity should be performed in episodes of at least 10 minutes, and preferably, it should be spread throughout the week.  Behavioral modification strategies: increasing lean protein intake, decreasing simple carbohydrates, increasing vegetables, increasing water intake, meal planning and cooking strategies, and planning for success.  [name] agreed to follow-up with our clinic in 2 weeks. She was informed of the importance of frequent follow-up visits to maximize her success with intensive lifestyle modifications for her multiple health conditions.    Objective:   Blood pressure 129/76, pulse 61, temperature 97.7 F (36.5 C), height 5' (1.524 m), weight 165 lb (74.8 kg), SpO2 98 %. Body mass index is 32.22 kg/m.  General: Cooperative, alert, well developed, in no acute distress. HEENT: Conjunctivae and lids unremarkable. Cardiovascular: Regular rhythm.  Lungs: Normal work of breathing. Neurologic: No focal deficits.   Lab Results  Component Value Date   CREATININE 0.81 12/16/2022   BUN 21 12/16/2022   NA 141 12/16/2022   K 4.5 12/16/2022   CL 102 12/16/2022   CO2 24 12/16/2022   Lab Results  Component Value Date   ALT 18 12/16/2022   AST 15 12/16/2022   ALKPHOS 62 12/16/2022    BILITOT 0.5 12/16/2022   Lab Results  Component Value Date   HGBA1C 5.6 12/16/2022   HGBA1C 5.6 09/09/2022   HGBA1C 5.6 04/20/2022   HGBA1C 5.7 (H) 01/08/2022   HGBA1C 5.6 07/22/2021   Lab Results  Component Value Date   INSULIN 6.5 12/16/2022   INSULIN 6.5 09/09/2022   INSULIN 7.9 04/20/2022   INSULIN 7.7 01/08/2022   INSULIN 12.0 04/03/2020   Lab Results  Component Value Date   TSH 2.75 10/26/2022   Lab Results  Component Value Date   CHOL 259 (H) 09/09/2022   HDL 62 09/09/2022   LDLCALC 145 (H) 09/09/2022   TRIG 287 (H) 09/09/2022   CHOLHDL 4.2 09/09/2022   Lab Results  Component Value Date   VD25OH 37.9 12/16/2022   VD25OH 32.7 09/09/2022   VD25OH 37.1 04/20/2022   Lab Results  Component Value Date   WBC 6.6 09/09/2022   HGB 15.0 09/09/2022   HCT 43.8 09/09/2022   MCV 97 09/09/2022   PLT 354 09/09/2022   No results found for: "IRON", "TIBC", "FERRITIN"   Attestation Statements:   Reviewed by clinician on day of visit: allergies, medications, problem list, medical history, surgical history, family history, social history, and previous encounter notes.   I have reviewed the above documentation for accuracy and completeness, and I agree with the above. -  [name] d. Deanna Tilmon, NP-C

## 2023-01-13 ENCOUNTER — Encounter (INDEPENDENT_AMBULATORY_CARE_PROVIDER_SITE_OTHER): Payer: Self-pay | Admitting: Adult Health

## 2023-01-13 ENCOUNTER — Ambulatory Visit (INDEPENDENT_AMBULATORY_CARE_PROVIDER_SITE_OTHER): Payer: BC Managed Care – PPO | Admitting: Adult Health

## 2023-01-13 VITALS — BP 137/75 | HR 71 | Temp 98.2°F | Ht 60.0 in | Wt 163.0 lb

## 2023-01-13 DIAGNOSIS — E66811 Obesity, class 1: Secondary | ICD-10-CM

## 2023-01-13 DIAGNOSIS — Z6835 Body mass index (BMI) 35.0-35.9, adult: Secondary | ICD-10-CM

## 2023-01-13 DIAGNOSIS — E559 Vitamin D deficiency, unspecified: Secondary | ICD-10-CM | POA: Diagnosis not present

## 2023-01-13 DIAGNOSIS — G8929 Other chronic pain: Secondary | ICD-10-CM

## 2023-01-13 DIAGNOSIS — I10 Essential (primary) hypertension: Secondary | ICD-10-CM

## 2023-01-13 DIAGNOSIS — R7303 Prediabetes: Secondary | ICD-10-CM | POA: Diagnosis not present

## 2023-01-13 DIAGNOSIS — E038 Other specified hypothyroidism: Secondary | ICD-10-CM

## 2023-01-13 DIAGNOSIS — Z6831 Body mass index (BMI) 31.0-31.9, adult: Secondary | ICD-10-CM

## 2023-01-13 DIAGNOSIS — E669 Obesity, unspecified: Secondary | ICD-10-CM

## 2023-01-13 DIAGNOSIS — M545 Low back pain, unspecified: Secondary | ICD-10-CM | POA: Diagnosis not present

## 2023-01-13 MED ORDER — MELOXICAM 7.5 MG PO TABS: 7.5000 mg | ORAL_TABLET | Freq: Every day | ORAL | 0 refills | Status: DC | PRN

## 2023-01-13 NOTE — Assessment & Plan Note (Deleted)
xxx

## 2023-01-13 NOTE — Progress Notes (Deleted)
[place] [address] Dept: [phone] Dept Fax: [phone]  AT A GLANCE:  Vitals Temp: 98.2 F (36.8 C) BP: 137/75 Pulse Rate: 71 SpO2: 97 %   Anthropometric Measurements Height: 5' (1.524 m) Weight: 163 lb (73.9 kg) BMI (Calculated): 31.83 Weight at Last Visit: 165lbs Weight Lost Since Last Visit: 2 Starting Weight: 177lb   Body Composition  Body Fat %: 37.8 % Fat Mass (lbs): 61.6 lbs Muscle Mass (lbs): 96.2 lbs Total Body Water (lbs): 66.4 lbs Visceral Fat Rating : 10   Other Clinical Data Fasting: No Labs: No Today's Visit #: 13 Starting Date: 04/03/20     SUBJECTIVE: No chief complaint on file.   [name] is here to discuss her progress with her obesity treatment plan. She is on the Journaling plan   calories *** protein Daily, Breakfast, Lunch, and Supper and states she is following her eating plan approximately 70 % of the time. She states she is exercising walking 30 minutes 2 times per week.  OBJECTIVE: Visit Diagnoses: Problem List Items Addressed This Visit       Cardiovascular and Mediastinum   Essential hypertension     Endocrine   Hypothyroid     Other   Obesity   Pre-diabetes   Vitamin D deficiency - Primary   Other Visit Diagnoses     Obesity, Starting BMI 34.57           Interim History:  SHE TAUGHT HER LAST CLASS OF HER CAREER!!!!   ASSESSMENT AND PLAN:  Diet: [name] {CHL AMB IS/IS NOT:210130109} currently in the action stage of change. As such, her goal is to {HWW Weight Loss Efforts:210964006}. She {HAS HAS KQ:[id number] agreed to {HWW Weight Loss Plan:210964005}.  Exercise: [name] has been instructed {HWW Exercise:210964007} for weight loss and overall health benefits.   Behavior Modification:  We discussed the following Behavioral Modification Strategies today: {HWW Behavior  Modification:210964008}. We discussed various medication options to help [name] with her weight loss efforts and we both agreed to ***.  Return in about 4 weeks (around 02/10/2023).[name] She was informed of the importance of frequent follow up visits to maximize her success with intensive lifestyle modifications for her multiple health conditions.  Attestation Statements:   Reviewed by clinician on day of visit: allergies, medications, problem list, medical history, surgical history, family history, social history, and previous encounter notes.   Time spent on visit including pre-visit chart review and post-visit care and charting was *** minutes.    Esaw Grandchild, NP

## 2023-01-13 NOTE — Progress Notes (Signed)
Chief Complaint:   OBESITY [name] is here to discuss her progress with her obesity treatment plan along with follow-up of her obesity related diagnoses. [name] is on keeping a food journal and adhering to recommended goals of 1200 calories and 90 protein and states she is following her eating plan approximately 70% of the time.  [name] states she is briskly walking 30 minutes 2 times per week.  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 She may choose to return in the future.  She has been acutely ill with URI sx's that last two weeks, specifically nasal congestion, HA, and fatigue. She denies fever, dyspnea. Home COVID-19 Test was negative.  She is focused on increasing overall health and stamina for her upcoming [place] Trip: Mid April- Mid May 2024.  Subjective:   1. Vitamin D deficiency Discussed Labs  Latest Reference Range & Units 12/16/22 11:58  Vitamin D, 25-Hydroxy 30.0 - 100.0 ng/mL 37.9  She increased Ergocalciferol from weekly to twice weekly two weeks ago after 12/16/22 results. She denies N/V/Muscle Weakness  2. Pre-diabetes Discussed Labs Lab Results  Component Value Date   HGBA1C 5.6 12/16/2022   HGBA1C 5.6 09/09/2022   HGBA1C 5.6 04/20/2022   She is on Metformin XR '750mg'$  twice daily- denies GI upset. A1c and insulin levels stable.  3. Essential hypertension Discussed Labs 12/16/22 CMP- electrolytes and Kidney fx both stable.   4. Other specified hypothyroidism Last TSH was therapeutic  Latest Reference Range & Units 10/26/22 11:56  TSH 0.35 - 5.50 uIU/mL 2.75   5. Low back pain, unspecified back pain laterality, unspecified chronicity, unspecified whether sciatica present 12/16/22 CMP: Serum  Creat 0.81                        GFR 82  [name] will experience intermittent, low back pain localized in midline. She denies sciatica or change in bowel/bladder habits with pain. Pain will often occur after strenuous exercise. She requests refill on NSAID- Meloxicam PDMP reviewed- no aberrancies noted.  Assessment/Plan:   1. Vitamin D deficiency Continue twice weekly Ergocalciferol  2. Pre-diabetes Continue Metformin XR '750mg'$  BID  3. Essential hypertension Continue daily Vastotec '20mg'$    4. Other specified hypothyroidism Continue Synthoid 88 mcg QAM  5. Low back pain, unspecified back pain laterality, unspecified chronicity, unspecified whether sciatica present Refill - meloxicam (MOBIC) 7.5 MG tablet; Take 1 tablet (7.5 mg total) by mouth daily as needed for pain.  Dispense: 30 tablet; Refill: 0  7. Obesity with current BMI 31.8  [name] is currently in the action stage of change. As such, her goal is to continue with weight loss efforts. She has agreed to keeping a food journal and adhering to recommended goals of 1200 calories and 90 protein.   Exercise goals: For substantial health benefits, adults should do at least 150 minutes (2 hours and 30 minutes) a week of moderate-intensity, or 75 minutes (1 hour and 15 minutes) a week of vigorous-intensity aerobic physical activity, or an equivalent combination of moderate- and vigorous-intensity aerobic activity. Aerobic activity should be performed in episodes of at least 10 minutes, and preferably, it should be spread throughout the week.  Behavioral modification strategies: increasing lean protein  intake, decreasing simple carbohydrates, increasing vegetables, increasing water intake, increasing high fiber foods, meal planning and cooking strategies, and planning for success.  [name] has agreed to follow-up with our clinic in 2 weeks. She was informed of the importance of frequent follow-up visits to maximize her success  with intensive lifestyle modifications for her multiple health conditions.   Objective:   Blood pressure 137/75, pulse 71, temperature 98.2 F (36.8 C), height 5' (1.524 m), weight 163 lb (73.9 kg), SpO2 97 %. Body mass index is 31.83 kg/m.  General: Cooperative, alert, well developed, in no acute distress. HEENT: Conjunctivae and lids unremarkable. Cardiovascular: Regular rhythm.  Lungs: Normal work of breathing. Neurologic: No focal deficits.   Lab Results  Component Value Date   CREATININE 0.81 12/16/2022   BUN 21 12/16/2022   NA 141 12/16/2022   K 4.5 12/16/2022   CL 102 12/16/2022   CO2 24 12/16/2022   Lab Results  Component Value Date   ALT 18 12/16/2022   AST 15 12/16/2022   ALKPHOS 62 12/16/2022   BILITOT 0.5 12/16/2022   Lab Results  Component Value Date   HGBA1C 5.6 12/16/2022   HGBA1C 5.6 09/09/2022   HGBA1C 5.6 04/20/2022   HGBA1C 5.7 (H) 01/08/2022   HGBA1C 5.6 07/22/2021   Lab Results  Component Value Date   INSULIN 6.5 12/16/2022   INSULIN 6.5 09/09/2022   INSULIN 7.9 04/20/2022   INSULIN 7.7 01/08/2022   INSULIN 12.0 04/03/2020   Lab Results  Component Value Date   TSH 2.75 10/26/2022   Lab Results  Component Value Date   CHOL 259 (H) 09/09/2022   HDL 62 09/09/2022   LDLCALC 145 (H) 09/09/2022   TRIG 287 (H) 09/09/2022   CHOLHDL 4.2 09/09/2022   Lab Results  Component Value Date   VD25OH 37.9 12/16/2022   VD25OH 32.7 09/09/2022   VD25OH 37.1 04/20/2022   Lab Results  Component Value Date   WBC 6.6 09/09/2022   HGB 15.0 09/09/2022   HCT 43.8 09/09/2022   MCV 97 09/09/2022   PLT 354 09/09/2022   No results found for: "IRON", "TIBC", "FERRITIN"   Attestation Statements:   Reviewed by clinician on day of visit: allergies, medications, problem list, medical history, surgical history, family history, social history, and previous encounter notes.  I have reviewed the above documentation for accuracy and completeness, and I agree  with the above. -  Xzavian Semmel d. Azreal Stthomas, NP-C

## 2023-01-13 NOTE — Assessment & Plan Note (Signed)
Latest Reference Range & Units 12/16/22 11:58  Vitamin D, 25-Hydroxy 30.0 - 100.0 ng/mL 37.9  Discussed Labs Ergocalciferol increased from weekly to twice weekly 2 weeks ago- denies N/V/Muscle [employer]

## 2023-01-23 ENCOUNTER — Other Ambulatory Visit (INDEPENDENT_AMBULATORY_CARE_PROVIDER_SITE_OTHER): Payer: Self-pay | Admitting: Adult Health

## 2023-01-23 DIAGNOSIS — R7303 Prediabetes: Secondary | ICD-10-CM

## 2023-01-26 ENCOUNTER — Encounter: Payer: Self-pay | Admitting: Family Medicine

## 2023-01-27 ENCOUNTER — Other Ambulatory Visit (INDEPENDENT_AMBULATORY_CARE_PROVIDER_SITE_OTHER): Payer: Self-pay | Admitting: Adult Health

## 2023-01-27 DIAGNOSIS — R7303 Prediabetes: Secondary | ICD-10-CM

## 2023-01-28 ENCOUNTER — Ambulatory Visit (INDEPENDENT_AMBULATORY_CARE_PROVIDER_SITE_OTHER): Payer: BC Managed Care – PPO | Admitting: Adult Health

## 2023-02-09 ENCOUNTER — Encounter (INDEPENDENT_AMBULATORY_CARE_PROVIDER_SITE_OTHER): Payer: Self-pay | Admitting: Adult Health

## 2023-02-09 ENCOUNTER — Ambulatory Visit (INDEPENDENT_AMBULATORY_CARE_PROVIDER_SITE_OTHER): Payer: BC Managed Care – PPO | Admitting: Adult Health

## 2023-02-09 VITALS — BP 143/84 | HR 64 | Temp 97.7°F | Ht 60.0 in | Wt 165.0 lb

## 2023-02-09 DIAGNOSIS — E559 Vitamin D deficiency, unspecified: Secondary | ICD-10-CM

## 2023-02-09 DIAGNOSIS — M545 Low back pain, unspecified: Secondary | ICD-10-CM

## 2023-02-09 DIAGNOSIS — R7303 Prediabetes: Secondary | ICD-10-CM | POA: Diagnosis not present

## 2023-02-09 DIAGNOSIS — E669 Obesity, unspecified: Secondary | ICD-10-CM

## 2023-02-09 DIAGNOSIS — Z6831 Body mass index (BMI) 31.0-31.9, adult: Secondary | ICD-10-CM

## 2023-02-09 MED ORDER — METFORMIN HCL ER 750 MG PO TB24: ORAL_TABLET | ORAL | 0 refills | Status: DC

## 2023-02-09 MED ORDER — VITAMIN D (ERGOCALCIFEROL) 1.25 MG (50000 UNIT) PO CAPS: 50000.0000 [IU] | ORAL_CAPSULE | ORAL | 0 refills | Status: DC

## 2023-02-09 MED ORDER — MELOXICAM 7.5 MG PO TABS: 7.5000 mg | ORAL_TABLET | Freq: Every day | ORAL | 0 refills | Status: DC | PRN

## 2023-02-09 NOTE — Progress Notes (Addendum)
WEIGHT SUMMARY AND BIOMETRICS  Vitals Temp: 97.7 F (36.5 C) BP: (!) 143/84 Pulse Rate: 64 SpO2: 98 %   Anthropometric Measurements Height: 5' (1.524 m) Weight: 165 lb (74.8 kg) BMI (Calculated): 32.22 Weight at Last Visit: 163lb Weight Lost Since Last Visit: 0 Weight Gained Since Last Visit: 2lb Starting Weight: 177lb Total Weight Loss (lbs): 12 lb (5.443 kg)   Body Composition  Body Fat %: 39.5 % Fat Mass (lbs): 65.2 lbs Muscle Mass (lbs): 95 lbs Total Body Water (lbs): 68 lbs Visceral Fat Rating : 11   Other Clinical Data Fasting: no Labs: no Today's Visit #: 14 Starting Date: 04/03/20    Chief Complaint:   OBESITY [name] is here to discuss her progress with her obesity treatment plan. She is on the keeping a food journal and adhering to recommended goals of 1200 calories and 90 protein and states she is following her eating plan approximately 60 % of the time. She states she is exercising walking/yard work 30 minutes 3 times per week.   Interim History:  She endorses polyphagia after 6pm almost everyday- discussed best snack options for healthy evening eating.  She recently went to her mother's home ([place]) to help clean out and prepare home for sale. Drive to / from [place] labor of home clean out Sleeping on air mattresses All the above has produced=SIG increase in lumbar back pain with bilateral sciatica  Husband, brother, sister-in-law,, and 2 nieces will be travelling to [place] to observe the [employer].  She will travel to [place], [place], and [place]: February 26, 2023- Mar 28, 2023  Subjective:   1. Low back pain, unspecified back pain laterality, unspecified chronicity, unspecified whether sciatica present She recently travelled to her mother's home in [place] to help her clean home and prepare for sale. She endorses increase in lumbar back with bilateral sciatica. PDMP reviewed- no medications listed in last 2  calendar years.  2. Pre-diabetes  Latest Reference Range & Units 12/16/22 11:58  Glucose 70 - 99 mg/dL 82  Hemoglobin A1C 4.8 - 5.6 % 5.6  Est. average glucose Bld gHb Est-mCnc mg/dL 114  INSULIN 2.6 - 24.9 uIU/mL 6.5  Metformin XR 750mg  1 tab with first meal and 1 tab with dinner Dispense: 60 tablet, Refills: 0 ordered  She endorses polyphagia after 6pm - discussed best snack options for healthy evening eating.  3. Vitamin D deficiency  Latest Reference Range & Units 12/16/22 11:58  Vitamin D, 25-Hydroxy 30.0 - 100.0 ng/mL 37.9  She is on Ergocalciferol 50,000 IU Q3D She would like to re-start OTC Multivitamin   Assessment/Plan:   1. Low back pain, unspecified back pain laterality, unspecified chronicity, unspecified whether sciatica present Refill - last REFILL UNTIL SUMMER 2024 meloxicam (MOBIC) 7.5 MG tablet  Take 1 tablet (7.5 mg total) by mouth daily as needed for pain. Dispense: 30 tablet, Refills: 0 ordered   2. Pre-diabetes Metformin XR 750mg  1 tab with first meal and 1 tab with dinner Dispense: 60 tablet, Refills: 0 ordered   3. Vitamin D deficiency Refill and decrease Ergocalciferol 50,000 IU Q7D Disp 8 RF 0 Okay to restart OTC Multivitamin    4. Obesity with current BMI 31.8  [name] is currently in the action stage of change. As such, her goal is to continue with weight loss efforts. She has agreed to keeping a food journal and adhering to recommended goals of 1200 calories and 90 protein.   Exercise goals: All adults  should avoid inactivity. Some physical activity is better than none, and adults who participate in any amount of physical activity gain some health benefits. For additional and more extensive health benefits, adults should increase their aerobic physical activity to 300 minutes (5 hours) a week of moderate-intensity, or 150 minutes a week of vigorous-intensity aerobic physical activity, or an equivalent combination of moderate- and vigorous-intensity  activity. Additional health benefits are gained by engaging in physical activity beyond this amount.  Adults should also include muscle-strengthening activities that involve all major muscle groups on 2 or more days a week.  Behavioral modification strategies: increasing lean protein intake, decreasing simple carbohydrates, increasing vegetables, increasing water intake, no skipping meals, meal planning and cooking strategies, travel eating strategies, and planning for success.  Prayer has agreed to follow-up with our clinic in 7 weeks. She was informed of the importance of frequent follow-up visits to maximize her success with intensive lifestyle modifications for her multiple health conditions.   Objective:   Blood pressure (!) 143/84, pulse 64, temperature 97.7 F (36.5 C), height 5' (1.524 m), weight 165 lb (74.8 kg), SpO2 98 %. Body mass index is 32.22 kg/m.  General: Cooperative, alert, well developed, in no acute distress. HEENT: Conjunctivae and lids unremarkable. Cardiovascular: Regular rhythm.  Lungs: Normal work of breathing. Neurologic: No focal deficits.   Lab Results  Component Value Date   CREATININE 0.81 12/16/2022   BUN 21 12/16/2022   NA 141 12/16/2022   K 4.5 12/16/2022   CL 102 12/16/2022   CO2 24 12/16/2022   Lab Results  Component Value Date   ALT 18 12/16/2022   AST 15 12/16/2022   ALKPHOS 62 12/16/2022   BILITOT 0.5 12/16/2022   Lab Results  Component Value Date   HGBA1C 5.6 12/16/2022   HGBA1C 5.6 09/09/2022   HGBA1C 5.6 04/20/2022   HGBA1C 5.7 (H) 01/08/2022   HGBA1C 5.6 07/22/2021   Lab Results  Component Value Date   INSULIN 6.5 12/16/2022   INSULIN 6.5 09/09/2022   INSULIN 7.9 04/20/2022   INSULIN 7.7 01/08/2022   INSULIN 12.0 04/03/2020   Lab Results  Component Value Date   TSH 2.75 10/26/2022   Lab Results  Component Value Date   CHOL 259 (H) 09/09/2022   HDL 62 09/09/2022   LDLCALC 145 (H) 09/09/2022   TRIG 287 (H)  09/09/2022   CHOLHDL 4.2 09/09/2022   Lab Results  Component Value Date   VD25OH 37.9 12/16/2022   VD25OH 32.7 09/09/2022   VD25OH 37.1 04/20/2022   Lab Results  Component Value Date   WBC 6.6 09/09/2022   HGB 15.0 09/09/2022   HCT 43.8 09/09/2022   MCV 97 09/09/2022   PLT 354 09/09/2022   No results found for: "IRON", "TIBC", "FERRITIN"  Attestation Statements:   Reviewed by clinician on day of visit: allergies, medications, problem list, medical history, surgical history, family history, social history, and previous encounter notes.  I have reviewed the above documentation for accuracy and completeness, and I agree with the above. -  Syleena Mchan d. Jasey Cortez, NP-C

## 2023-02-09 NOTE — Addendum Note (Signed)
Addended by: Mina Marble D on: 02/09/2023 03:17 PM   Modules accepted: Orders

## 2023-02-11 ENCOUNTER — Ambulatory Visit (INDEPENDENT_AMBULATORY_CARE_PROVIDER_SITE_OTHER): Payer: BC Managed Care – PPO | Admitting: Adult Health

## 2023-03-08 ENCOUNTER — Other Ambulatory Visit (INDEPENDENT_AMBULATORY_CARE_PROVIDER_SITE_OTHER): Payer: Self-pay | Admitting: Adult Health

## 2023-03-08 DIAGNOSIS — R7303 Prediabetes: Secondary | ICD-10-CM

## 2023-03-30 ENCOUNTER — Encounter (INDEPENDENT_AMBULATORY_CARE_PROVIDER_SITE_OTHER): Payer: Self-pay | Admitting: Adult Health

## 2023-03-30 ENCOUNTER — Ambulatory Visit (INDEPENDENT_AMBULATORY_CARE_PROVIDER_SITE_OTHER): Payer: BC Managed Care – PPO | Admitting: Adult Health

## 2023-03-30 VITALS — BP 137/89 | HR 76 | Temp 98.2°F | Ht 60.0 in | Wt 166.0 lb

## 2023-03-30 DIAGNOSIS — Z6832 Body mass index (BMI) 32.0-32.9, adult: Secondary | ICD-10-CM

## 2023-03-30 DIAGNOSIS — M25562 Pain in left knee: Secondary | ICD-10-CM

## 2023-03-30 DIAGNOSIS — R7303 Prediabetes: Secondary | ICD-10-CM | POA: Diagnosis not present

## 2023-03-30 DIAGNOSIS — M25561 Pain in right knee: Secondary | ICD-10-CM

## 2023-03-30 DIAGNOSIS — E039 Hypothyroidism, unspecified: Secondary | ICD-10-CM

## 2023-03-30 DIAGNOSIS — E669 Obesity, unspecified: Secondary | ICD-10-CM

## 2023-03-30 DIAGNOSIS — G8929 Other chronic pain: Secondary | ICD-10-CM

## 2023-03-30 MED ORDER — METFORMIN HCL ER 750 MG PO TB24: ORAL_TABLET | ORAL | 0 refills | Status: DC

## 2023-03-30 NOTE — Progress Notes (Addendum)
WEIGHT SUMMARY AND BIOMETRICS  Vitals Temp: 98.2 F (36.8 C) BP: 137/89 Pulse Rate: 76 SpO2: 98 %   Anthropometric Measurements Height: 5' (1.524 m) Weight: 166 lb (75.3 kg) BMI (Calculated): 32.42 Weight at Last Visit: 165lb Weight Lost Since Last Visit: 0 Weight Gained Since Last Visit: 1lb Starting Weight: 177lb Total Weight Loss (lbs): 13 lb (5.897 kg)   Body Composition  Body Fat %: 37.8 % Fat Mass (lbs): 63 lbs Muscle Mass (lbs): 98.4 lbs Total Body Water (lbs): 67.4 lbs Visceral Fat Rating : 11   Other Clinical Data Fasting: yes Labs: no Today's Visit #: 15 Starting Date: 04/03/20    Chief Complaint:   OBESITY [name] is here to discuss her progress with her obesity treatment plan. She is on the keeping a food journal and adhering to recommended goals of 1200 calories and 90 protein and states she is following her eating plan approximately 50 % of the time. She states she is exercising WALKING- MONTH LONG- [place] Vacation.   Interim History:  [name] just returned from a month long [place] vacation- [place], [place], [place], [place], [place], and [place]. She estimates to have walked >3 hrs/day. Hunger/appetite-She denies polyphagia- currently on Metformin XR 750mg  2 tabs QD  Reviewed Bioimpedance with pt: Muscle Mass: +3.4 lbs Adipose Mass: - 2.2 lbs  Subjective:   1. Hypothyroidism, unspecified type  Latest Reference Range & Units 01/08/22 08:37 09/09/22 10:13 10/26/22 11:56  TSH 0.35 - 5.50 uIU/mL 0.430 (L) 0.234 (L) 2.75  (L): Data is abnormally low She is currently on Synthroid 88 mcg QD- reports fatigue from recent jet lag  2. Pre-diabetes Lab Results  Component Value Date   HGBA1C 5.6 12/16/2022   HGBA1C 5.6 09/09/2022   HGBA1C 5.6 04/20/2022    Latest Reference Range & Units 04/20/22 09:21 09/09/22 10:13 12/16/22 11:58  eGFR >59 mL/min/1.73 84 77 82  She denies polyphagia on metFORMIN (GLUCOPHAGE-XR) 750 MG 24 hr tablet; 1  tab with first meal and 1 tab with dinner  3. Bilateral Knee Pain Pain > 12 years Hx of bilateral arthroscopy surgery. She reports worsening knee pain and stiffness. She will occasionally use Meloxicam, rest, ice to manage sx's. She is not currently being seen by Sports Med or Orthopedic Surgeon  Assessment/Plan:   1. Hypothyroidism, unspecified type Check Labs Q6M  2. Pre-diabetes Refill - metFORMIN (GLUCOPHAGE-XR) 750 MG 24 hr tablet; 1 tab with first meal and 1 tab with dinner  Dispense: 60 tablet; Refill: 0  3. Bilateral Knee Pain Continue with weight loss efforts [facility]- Dr. Thomasena Edis  4.Obesity with current BMI 32.42  [name] is currently in the action stage of change. As such, her goal is to continue with weight loss efforts. She has agreed to keeping a food journal and adhering to recommended goals of 1200 calories and 90 protein.   Exercise goals: For substantial health benefits, adults should do at least 150 minutes (2 hours and 30 minutes) a week of moderate-intensity, or 75 minutes (1 hour and 15 minutes) a week of vigorous-intensity aerobic physical activity, or an equivalent combination of moderate- and vigorous-intensity aerobic activity. Aerobic activity should be performed in episodes of at least 10 minutes, and preferably, it should be spread throughout the week.  Behavioral modification strategies: increasing lean protein intake, decreasing simple carbohydrates, increasing vegetables, increasing water intake, no skipping meals, meal planning and cooking strategies, keeping healthy foods in the home, and planning for success.  [name] has agreed to  follow-up with our clinic in 4 weeks. She was informed of the importance of frequent follow-up visits to maximize her success with intensive lifestyle modifications for her multiple health conditions.   Check Fasting Labs at Next OV  Objective:   Blood pressure 137/89, pulse 76, temperature 98.2 F (36.8 C),  height 5' (1.524 m), weight 166 lb (75.3 kg), SpO2 98 %. Body mass index is 32.42 kg/m.  General: Cooperative, alert, well developed, in no acute distress. HEENT: Conjunctivae and lids unremarkable. Cardiovascular: Regular rhythm.  Lungs: Normal work of breathing. Neurologic: No focal deficits.   Lab Results  Component Value Date   CREATININE 0.81 12/16/2022   BUN 21 12/16/2022   NA 141 12/16/2022   K 4.5 12/16/2022   CL 102 12/16/2022   CO2 24 12/16/2022   Lab Results  Component Value Date   ALT 18 12/16/2022   AST 15 12/16/2022   ALKPHOS 62 12/16/2022   BILITOT 0.5 12/16/2022   Lab Results  Component Value Date   HGBA1C 5.6 12/16/2022   HGBA1C 5.6 09/09/2022   HGBA1C 5.6 04/20/2022   HGBA1C 5.7 (H) 01/08/2022   HGBA1C 5.6 07/22/2021   Lab Results  Component Value Date   INSULIN 6.5 12/16/2022   INSULIN 6.5 09/09/2022   INSULIN 7.9 04/20/2022   INSULIN 7.7 01/08/2022   INSULIN 12.0 04/03/2020   Lab Results  Component Value Date   TSH 2.75 10/26/2022   Lab Results  Component Value Date   CHOL 259 (H) 09/09/2022   HDL 62 09/09/2022   LDLCALC 145 (H) 09/09/2022   TRIG 287 (H) 09/09/2022   CHOLHDL 4.2 09/09/2022   Lab Results  Component Value Date   VD25OH 37.9 12/16/2022   VD25OH 32.7 09/09/2022   VD25OH 37.1 04/20/2022   Lab Results  Component Value Date   WBC 6.6 09/09/2022   HGB 15.0 09/09/2022   HCT 43.8 09/09/2022   MCV 97 09/09/2022   PLT 354 09/09/2022   No results found for: "IRON", "TIBC", "FERRITIN"  Attestation Statements:   Reviewed by clinician on day of visit: allergies, medications, problem list, medical history, surgical history, family history, social history, and previous encounter notes.  I have reviewed the above documentation for accuracy and completeness, and I agree with the above. -  Reginia Battie d. Jackqulyn Mendel, NP-C

## 2023-04-27 ENCOUNTER — Ambulatory Visit (INDEPENDENT_AMBULATORY_CARE_PROVIDER_SITE_OTHER): Payer: BC Managed Care – PPO | Admitting: Adult Health

## 2023-04-28 ENCOUNTER — Encounter: Payer: Self-pay | Admitting: Family Medicine

## 2023-04-28 ENCOUNTER — Ambulatory Visit: Payer: BC Managed Care – PPO | Admitting: Family Medicine

## 2023-04-28 VITALS — BP 130/78 | HR 71 | Temp 98.0°F | Ht 60.0 in | Wt 171.2 lb

## 2023-04-28 DIAGNOSIS — M25562 Pain in left knee: Secondary | ICD-10-CM

## 2023-04-28 DIAGNOSIS — E039 Hypothyroidism, unspecified: Secondary | ICD-10-CM

## 2023-04-28 DIAGNOSIS — I10 Essential (primary) hypertension: Secondary | ICD-10-CM

## 2023-04-28 DIAGNOSIS — M25561 Pain in right knee: Secondary | ICD-10-CM | POA: Diagnosis not present

## 2023-04-28 DIAGNOSIS — R21 Rash and other nonspecific skin eruption: Secondary | ICD-10-CM

## 2023-04-28 DIAGNOSIS — G8929 Other chronic pain: Secondary | ICD-10-CM

## 2023-04-28 DIAGNOSIS — M5441 Lumbago with sciatica, right side: Secondary | ICD-10-CM

## 2023-04-28 LAB — BASIC METABOLIC PANEL
BUN: 16 mg/dL (ref 6–23)
CO2: 28 mEq/L (ref 19–32)
Calcium: 9.8 mg/dL (ref 8.4–10.5)
Chloride: 103 mEq/L (ref 96–112)
Creatinine, Ser: 0.8 mg/dL (ref 0.40–1.20)
GFR: 78.67 mL/min (ref >60.00)
Glucose, Bld: 101 mg/dL — ABNORMAL HIGH (ref 70–99)
Potassium: 4.1 mEq/L (ref 3.5–5.1)
Sodium: 139 mEq/L (ref 135–145)

## 2023-04-28 LAB — TSH: TSH: 4.09 u[IU]/mL (ref 0.35–5.50)

## 2023-04-28 MED ORDER — ENALAPRIL MALEATE 20 MG PO TABS: 20.0000 mg | ORAL_TABLET | Freq: Every day | ORAL | 3 refills | Status: DC

## 2023-04-28 MED ORDER — LEVOTHYROXINE SODIUM 88 MCG PO TABS: 88.0000 ug | ORAL_TABLET | Freq: Every day | ORAL | 3 refills | Status: DC

## 2023-04-28 NOTE — Patient Instructions (Addendum)
I will check labs today and referred you to dermatology.  See info on managing blood pressure below. If running higher, let me know and we discuss changes if needed.   I will refer you to ortho for back and knee pain.  Topical voltaren gel may be an option for knees.   Chronic Back Pain Chronic back pain is back pain that lasts longer than 3 months. The cause of your back pain may not be known. Some common causes include: Wear and tear (degenerative disease) of the bones, disks, or tissues that connect bones to each other (ligaments) in your back. Inflammation and stiffness in your back (arthritis). If you have chronic back pain, you may have times when the pain is more intense (flare-ups). You can also learn to manage the pain with home care. Follow these instructions at home: Watch for any changes in your symptoms. Take these actions to help with your pain: Managing pain and stiffness     If told, put ice on the painful area. You may be told to apply ice for the first 24-48 hours after a flare-up starts. Put ice in a plastic bag. Place a towel between your skin and the bag. Leave the ice on for 20 minutes, 2-3 times per day. If told, apply heat to the affected area as often as told by your health care provider. Use the heat source that your provider recommends, such as a moist heat pack or a heating pad. Place a towel between your skin and the heat source. Leave the heat on for 20-30 minutes. If your skin turns bright red, remove the ice or heat right away to prevent skin damage. The risk of damage is higher if you cannot feel pain, heat, or cold. Try soaking in a warm tub. Activity        Avoid bending and other activities that make the pain worse. Have good posture when you stand or sit. When you stand, keep your upper back and neck straight, with your shoulders pulled back. Avoid slouching. When you sit, keep your back straight. Relax your shoulders. Do not round your  shoulders or pull them backward. Do not sit or stand in one place for too long. Take brief periods of rest during the day. This will reduce your pain. Resting in a lying or standing position is often better than sitting to rest. When you rest for longer periods, mix in some mild activity or stretching between periods of rest. This will help to prevent stiffness and pain. Get regular exercise. Ask your provider what activities are safe for you. You may have to avoid lifting. Ask your provider how much you can safely lift. If you do lift, always use the right technique. This means you should: Bend your knees. Keep the load close to your body. Avoid twisting. Medicines Take over-the-counter and prescription medicines only as told by your provider. You may need to take medicines for pain and inflammation. These may be taken by mouth or put on the skin. You may also be given muscle relaxants. Ask your provider if the medicine prescribed to you: Requires you to avoid driving or using machinery. Can cause constipation. You may need to take these actions to prevent or treat constipation: Drink enough fluid to keep your pee (urine) pale yellow. Take over-the-counter or prescription medicines. Eat foods that are high in fiber, such as beans, whole grains, and fresh fruits and vegetables. Limit foods that are high in fat and processed sugars, such  as fried or sweet foods. General instructions  Sleep on a firm mattress in a comfortable position. Try lying on your side with your knees slightly bent. If you lie on your back, put a pillow under your knees. Do not use any products that contain nicotine or tobacco. These products include cigarettes, chewing tobacco, and vaping devices, such as e-cigarettes. If you need help quitting, ask your provider. Contact a health care provider if: You have pain that does not get better with rest or medicine. You have new pain. You have a fever. You lose weight  quickly. You have trouble doing your normal activities. You feel weak or numb in one or both of your legs or feet. Get help right away if: You are not able to control when you pee or poop. You have severe back pain and: Nausea or vomiting. Pain in your chest or abdomen. Shortness of breath. You faint. These symptoms may be an emergency. Get help right away. Call 911. Do not wait to see if the symptoms will go away. Do not drive yourself to the hospital. This information is not intended to replace advice given to you by your health care provider. Make sure you discuss any questions you have with your health care provider. Document Revised: 06/22/2022 Document Reviewed: 06/22/2022 Elsevier Patient Education  2024 [employer].

## 2023-04-28 NOTE — Progress Notes (Signed)
Subjective:  Patient ID: [name], female    DOB: 06/18/1961  Age: 62 y.o. MRN: [id number]  CC:  Chief Complaint  Patient presents with   Hypothyroidism   Referral    Pt looking for a dermatologist and would like your input     HPI [name] presents for   Hypothyroidism: Lab Results  Component Value Date   TSH 2.75 10/26/2022  Treated with Synthroid 88 mcg daily Taking medication daily.  No new hot or cold intolerance. No new hair or skin changes (other than chest rash), heart palpitations or new fatigue. Some weight gain as below.   Chronic back pain, knee pain Saw spinal specialist a few years ago. Has rx for meloxicam, helps pain, helps movement, but concerned about cardiac symptoms- heart racing if more than a few days recently. Some more back pain recently. No recent fall or injury. Pain radiates to R leg at times.  No recent PT - 5 years ago. Some relief.  No spine injections. LS spine XR in 2021 - IMPRESSION: L1 compression fracture, old.  Osseous demineralization with mild degenerative disc and facet disease changes, No acute abnormalities. Prior knee surgery -  with PT as above. Would like to be seen at other practice than [facility].  Occasional R knee pain with feeling of giving way. No recent injury.  No bowel or bladder incontinence, no saddle anesthesia, no persistent lower extremity weakness.  Exercise for weight loss limited by these pains.   Hypertension: Enalapril 20 mg daily, no new med side effects.  Home readings: 130/70-80.  BP Readings from Last 3 Encounters:  04/28/23 130/78  03/30/23 137/89  02/09/23 (!) 143/84   Lab Results  Component Value Date   CREATININE 0.81 12/16/2022   Prediabetes: Treated with metformin 750 mg twice daily, followed by healthy weight and wellness for treatment of obesity.some weight gain off saxenda, had to stop d/t concern for pancreatitis.  Wt Readings from Last 3 Encounters:  04/28/23 171  lb 3.2 oz (77.7 kg)  03/30/23 166 lb (75.3 kg)  02/09/23 165 lb (74.8 kg)   Lab Results  Component Value Date   HGBA1C 5.6 12/16/2022   Wt Readings from Last 3 Encounters:  04/28/23 171 lb 3.2 oz (77.7 kg)  03/30/23 166 lb (75.3 kg)  02/09/23 165 lb (74.8 kg)   Dermatology referral Has not seen in years. Prior dermatologist required.  Rash on upper chest for years. Worse in summer with sun, chlorine, rash guard. No pain/itching. Appearance worsens. Puffy at times.  Family history of skin cancer - parents.   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   Anemia    Back pain    Blood transfusion without reported diagnosis    post-operatively in 20s after L1 fracture with spinal cord injury after horseback riding.   Chronic kidney disease    Tuberous Sclerosis; [facility].   Edema of both  lower extremities    Hyperlipidemia    Hypertension    Hypothyroidism    Lower back pain    Pre-diabetes    Thyroid disease    Vitamin D deficiency    Past Surgical History:  Procedure Laterality Date   ABDOMINAL HYSTERECTOMY     DUB; ovaries remaining.   CESAREAN SECTION     MENISCUS REPAIR     Sleep Study  12/17/2010   No sleep disordered breathing or periodic limb mvmets.  Excess light sleep.   SPINE SURGERY     L1 fracture with spinal cord injury with horseback riding.   TOTAL VAGINAL HYSTERECTOMY  2008   TUBAL LIGATION      Allergies  Allergen Reactions   Saxenda [Liraglutide -Weight Management] Nausea And Vomiting    With borderline high lipase - concern for possible pancreatitis.    Sulfa Antibiotics Hives   Lipitor [Atorvastatin] Other (See Comments)    Fatigue and confusion   Prior to Admission medications   Medication Sig Start Date End Date Taking? Authorizing Provider  enalapril (VASOTEC) 20 MG tablet TAKE 1 TABLET BY MOUTH EVERY DAY 07/17/22  Yes [name], MD  levothyroxine (SYNTHROID) 88 MCG tablet Take 1 tablet (88 mcg total) by mouth daily. 09/10/22  Yes [name], MD  meloxicam (MOBIC) 7.5 MG tablet Take 1 tablet (7.5 mg total) by mouth daily as needed for pain. 02/09/23  Yes Danford, Orpha Bur D, NP  metFORMIN (GLUCOPHAGE-XR) 750 MG 24 hr tablet 1 tab with first meal and 1 tab with dinner 03/30/23  Yes Danford, Orpha Bur D, NP  Vitamin D, Ergocalciferol, (DRISDOL) 1.25 MG (50000 UNIT) CAPS capsule Take 1 capsule (50,000 Units total) by mouth every 7 (seven) days. 02/09/23  Yes Danford, Jinny Blossom, NP   Social History   Socioeconomic History   Marital status: Married    Spouse name: Burnis Kingfisher   Number of children: Not on file   Years of education: Not on file   Highest education level: Master's degree (e.g., MA, MS, MEng, MEd, MSW, MBA)  Occupational History   Occupation: Tourist information centre manager  Tobacco Use   Smoking status: Never   Smokeless tobacco: Never  Vaping Use   Vaping Use: Never used  Substance and Sexual Activity   Alcohol use: Yes    Comment: occ   Drug use: No   Sexual activity: Yes    Birth control/protection: None  Other Topics Concern   Not on file  Social History Narrative   Marital status: married x 20+ years; happily married.      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      Tobacco; none      Alcohol:  Socially      Drugs: none      Exercise: sporadic   Social Determinants  of Health   Financial Resource Strain: Low Risk  (04/24/2023)   Overall Financial Resource Strain (CARDIA)    Difficulty of Paying Living Expenses: Not hard at all  Food Insecurity: No Food Insecurity (04/24/2023)   Hunger Vital Sign    Worried About Running Out of Food in the Last Year: Never true    Ran Out of Food in the Last Year: Never true  Transportation Needs: No Transportation Needs (04/24/2023)   PRAPARE - Transportation    Lack of Transportation (Medical): No    Lack of  Transportation (Non-Medical): No  Physical Activity: Insufficiently Active (04/24/2023)   Exercise Vital Sign    Days of Exercise per Week: 4 days    Minutes of Exercise per Session: 30 min  Stress: No Stress Concern Present (04/24/2023)   [employer] of Occupational Health - Occupational Stress Questionnaire    Feeling of Stress : Only a little  Social Connections: Moderately Isolated (04/24/2023)   Social Connection and Isolation Panel [NHANES]    Frequency of Communication with Friends and Family: More than three times a week    Frequency of Social Gatherings with Friends and Family: More than three times a week    Attends Religious Services: Never    Database administrator or Organizations: No    Attends Engineer, structural: Not on file    Marital Status: Married  Catering manager Violence: Not on file    Review of Systems Per HPI.   Objective:   Vitals:   04/28/23 0938  BP: 130/78  Pulse: 71  Temp: 98 F (36.7 C)  TempSrc: Temporal  SpO2: 98%  Weight: 171 lb 3.2 oz (77.7 kg)  Height: 5' (1.524 m)     Physical Exam Vitals reviewed.  Constitutional:      Appearance: Normal appearance. She is well-developed.  HENT:     Head: Normocephalic and atraumatic.  Eyes:     Conjunctiva/sclera: Conjunctivae normal.     Pupils: Pupils are equal, round, and reactive to light.  Neck:     Vascular: No carotid bruit.  Cardiovascular:     Rate and Rhythm: Normal rate and regular rhythm.      Heart sounds: Normal heart sounds.  Pulmonary:     Effort: Pulmonary effort is normal.     Breath sounds: Normal breath sounds.  Abdominal:     Palpations: Abdomen is soft. There is no pulsatile mass.     Tenderness: There is no abdominal tenderness.  Musculoskeletal:     Right lower leg: No edema.     Left lower leg: No edema.     Comments: Lumbar spine: no midline bony ttp. Pain with lateral flexion. Ambulating without assistive device, neg SLR.   Bilat knee exam - pain free ROM, no bony ttp. Neg mcmurray.     Skin:    General: Skin is warm and dry.     Findings: Rash (fine papular rash of upper chest, no erythema. no pustules.) present.  Neurological:     Mental Status: She is alert and oriented to person, place, and time.  Psychiatric:        Mood and Affect: Mood normal.        Behavior: Behavior normal.        Assessment & Plan:  [name] is a 62 y.o. female . Rash and nonspecific skin eruption - Plan: Ambulatory referral to Dermatology  -Does not appear to be tinea versicolor, not typical location of keratosis.  Question photosensitivity, but only upper chest.  Hold on new meds for now, refer to dermatology.  Hypothyroidism, unspecified type - Plan: TSH, levothyroxine (SYNTHROID) 88 MCG tablet  -Tolerating current regimen, check TSH, meds refilled, adjustment in meds/doses accordingly  Essential hypertension - Plan: enalapril (VASOTEC) 20 MG tablet, Basic metabolic panel  -Borderline, check home readings.  Handout given on management of hypertension.  Anticipate improvement as weight improves, unfortunately some weight increased with decreased ability of exercise from pain below and off Saxenda.  Continue to follow-up with healthy weight and wellness.  Continue same med regimen for now with RTC precautions if persistent elevated blood pressures.  Chronic pain of both knees - Plan: Ambulatory referral to Orthopedic Surgery  -Denies acute changes and  reassuring exam in office at present.  Imaging deferred for now.  Likely component of degenerative joint disease.  Topical Voltaren as option and referred to orthopedics to decide on updated imaging, PT or other treatments.  Chronic bilateral low back pain with right-sided sciatica - Plan: Ambulatory referral to Orthopedic Surgery  -With history of compression fracture.  Intermittent sciatic symptoms.  Previous improvement with PT.  Unfortunately back pain and knee pain have been impacting her exercise and goals for weight loss.  Anticipate repeat PT may be helpful but will refer to Ortho to decide if advanced imaging needed or initial trial of PT.  Meds ordered this encounter  Medications   enalapril (VASOTEC) 20 MG tablet    Sig: Take 1 tablet (20 mg total) by mouth daily.    Dispense:  90 tablet    Refill:  3   levothyroxine (SYNTHROID) 88 MCG tablet    Sig: Take 1 tablet (88 mcg total) by mouth daily.    Dispense:  90 tablet    Refill:  3   Patient Instructions  I will check labs today and referred you to dermatology.  See info on managing blood pressure below. If running higher, let me know and we discuss changes if needed.   I will refer you to ortho for back and knee pain.  Topical voltaren gel may be an option for knees.   Chronic Back Pain Chronic back pain is back pain that lasts longer than 3 months. The cause of your back pain may not be known. Some common causes include: Wear and tear (degenerative disease) of the bones, disks, or tissues that connect bones to each other (ligaments) in your back. Inflammation and stiffness in your back (arthritis). If you have chronic back pain, you may have times when the pain is more intense (flare-ups). You can also learn to manage the pain with home care. Follow these instructions at home: Watch for any changes in your symptoms. Take these actions to help with your pain: Managing pain and stiffness     If told, put ice on the  painful area. You may be told to apply ice for the first 24-48 hours after a flare-up starts. Put ice in a plastic bag. Place a towel between your skin and the bag. Leave the ice on for 20 minutes, 2-3 times per day. If told, apply heat to the affected area as often as told by your health care provider. Use the heat source that your provider recommends, such as a moist heat pack or a heating pad. Place a towel between your skin and the heat source. Leave the heat on for 20-30 minutes. If your skin turns bright red, remove the ice or heat right away to prevent skin damage. The risk of damage is higher if you cannot feel pain, heat, or cold. Try soaking in a warm tub. Activity        Avoid bending and other activities that make the pain worse. Have good posture when you stand or sit. When you stand, keep your upper back and neck straight, with your shoulders pulled back. Avoid slouching. When you sit, keep your back straight. Relax your shoulders. Do not round your shoulders or pull them backward. Do not sit or stand in one place for too long. Take  brief periods of rest during the day. This will reduce your pain. Resting in a lying or standing position is often better than sitting to rest. When you rest for longer periods, mix in some mild activity or stretching between periods of rest. This will help to prevent stiffness and pain. Get regular exercise. Ask your provider what activities are safe for you. You may have to avoid lifting. Ask your provider how much you can safely lift. If you do lift, always use the right technique. This means you should: Bend your knees. Keep the load close to your body. Avoid twisting. Medicines Take over-the-counter and prescription medicines only as told by your provider. You may need to take medicines for pain and inflammation. These may be taken by mouth or put on the skin. You may also be given muscle relaxants. Ask your provider if the medicine  prescribed to you: Requires you to avoid driving or using machinery. Can cause constipation. You may need to take these actions to prevent or treat constipation: Drink enough fluid to keep your pee (urine) pale yellow. Take over-the-counter or prescription medicines. Eat foods that are high in fiber, such as beans, whole grains, and fresh fruits and vegetables. Limit foods that are high in fat and processed sugars, such as fried or sweet foods. General instructions  Sleep on a firm mattress in a comfortable position. Try lying on your side with your knees slightly bent. If you lie on your back, put a pillow under your knees. Do not use any products that contain nicotine or tobacco. These products include cigarettes, chewing tobacco, and vaping devices, such as e-cigarettes. If you need help quitting, ask your provider. Contact a health care provider if: You have pain that does not get better with rest or medicine. You have new pain. You have a fever. You lose weight quickly. You have trouble doing your normal activities. You feel weak or numb in one or both of your legs or feet. Get help right away if: You are not able to control when you pee or poop. You have severe back pain and: Nausea or vomiting. Pain in your chest or abdomen. Shortness of breath. You faint. These symptoms may be an emergency. Get help right away. Call 911. Do not wait to see if the symptoms will go away. Do not drive yourself to the hospital. This information is not intended to replace advice given to you by your health care provider. Make sure you discuss any questions you have with your health care provider. Document Revised: 06/22/2022 Document Reviewed: 06/22/2022 Elsevier Patient Education  2024 Elsevier Inc.       Signed,   Meredith Staggers, MD [facility], [facility] 04/28/23 12:15 PM

## 2023-05-04 ENCOUNTER — Encounter (INDEPENDENT_AMBULATORY_CARE_PROVIDER_SITE_OTHER): Payer: Self-pay | Admitting: Adult Health

## 2023-05-04 ENCOUNTER — Ambulatory Visit (INDEPENDENT_AMBULATORY_CARE_PROVIDER_SITE_OTHER): Payer: BC Managed Care – PPO | Admitting: Adult Health

## 2023-05-04 ENCOUNTER — Ambulatory Visit: Payer: BC Managed Care – PPO | Admitting: Physician Assistant

## 2023-05-04 VITALS — BP 131/84 | HR 74 | Temp 97.8°F | Ht 60.0 in | Wt 168.0 lb

## 2023-05-04 DIAGNOSIS — M5441 Lumbago with sciatica, right side: Secondary | ICD-10-CM

## 2023-05-04 DIAGNOSIS — E559 Vitamin D deficiency, unspecified: Secondary | ICD-10-CM | POA: Diagnosis not present

## 2023-05-04 DIAGNOSIS — E66812 Obesity, class 2: Secondary | ICD-10-CM

## 2023-05-04 DIAGNOSIS — G4700 Insomnia, unspecified: Secondary | ICD-10-CM

## 2023-05-04 DIAGNOSIS — R7303 Prediabetes: Secondary | ICD-10-CM | POA: Diagnosis not present

## 2023-05-04 DIAGNOSIS — Z6832 Body mass index (BMI) 32.0-32.9, adult: Secondary | ICD-10-CM

## 2023-05-04 DIAGNOSIS — E66811 Obesity, class 1: Secondary | ICD-10-CM

## 2023-05-04 DIAGNOSIS — E669 Obesity, unspecified: Secondary | ICD-10-CM

## 2023-05-04 DIAGNOSIS — G8929 Other chronic pain: Secondary | ICD-10-CM

## 2023-05-04 MED ORDER — METFORMIN HCL ER 750 MG PO TB24: ORAL_TABLET | ORAL | 0 refills | Status: DC

## 2023-05-04 NOTE — Progress Notes (Signed)
WEIGHT SUMMARY AND BIOMETRICS  Vitals Temp: 97.8 F (36.6 C) BP: 131/84 Pulse Rate: 74 SpO2: 95 %   Anthropometric Measurements Height: 5' (1.524 m) Weight: 168 lb (76.2 kg) BMI (Calculated): 32.81 Weight at Last Visit: 166lb Weight Lost Since Last Visit: 0 Weight Gained Since Last Visit: 2lb Starting Weight: 177lb Total Weight Loss (lbs): 11 lb (4.99 kg)   Body Composition  Body Fat %: 39.5 % Fat Mass (lbs): 66.6 lbs Muscle Mass (lbs): 96.8 lbs Total Body Water (lbs): 68.6 lbs Visceral Fat Rating : 11   Other Clinical Data Fasting: Yes Labs: Yes Today's Visit #: 16 Starting Date: 04/03/20    Chief Complaint:   OBESITY [name] is here to discuss her progress with her obesity treatment plan. She is on the keeping a food journal and adhering to recommended goals of 1200 calories and 90 protein and states she is following her eating plan approximately 50 % of the time. She states she is not currently exercising.   Interim History:  She has recent visit with PCP/Dr. Neva Seat- 04/28/23- labs completed and referred to Orthopedic Specialist  She is frustrated that increase in chronic pain has prevented her from exercising as often as intensely as she would like.  Pain has also worsened pre-existing insomnia.  Subjective:   1. Chronic bilateral low back pain with right-sided sciatica 04/28/2023 PCP OV- Chronic bilateral low back pain with right-sided sciatica - Plan: Ambulatory referral to Orthopedic Surgery            -With history of compression fracture.  Intermittent sciatic symptoms.  Previous improvement with PT.  Unfortunately back pain and knee pain have been impacting her exercise and goals for weight loss.  Anticipate repeat PT may be helpful but will refer to Ortho to decide if advanced imaging needed or initial trial of PT.  2. Pre-diabetes Lab Results  Component Value Date   HGBA1C 5.6 12/16/2022   HGBA1C 5.6 09/09/2022   HGBA1C 5.6 04/20/2022   She is on Metformin XR 750 BID- denies GI upset She was unable to tolerated Saxenda- N/V with elevated Lipase - last use on/about 12/2021  3. Insomnia, unspecified type She reports difficulty remaining asleep, r/t to chronic pain  4. Vitamin D deficiency  Latest Reference Range & Units 04/20/22 09:21 09/09/22 10:13 12/16/22 11:58  Vitamin D, 25-Hydroxy 30.0 - 100.0 ng/mL 37.1 32.7 37.9   She is only one daily OTC Centrum Women 50+ Been off Ergocalciferol >8 weeks  Assessment/Plan:   1. Chronic bilateral low back pain with right-sided sciatica Appt with Ortho- Dr. Dub Mikes- 05/11/23  2. Pre-diabetes Refill - metFORMIN (GLUCOPHAGE-XR) 750 MG 24 hr tablet; 1 tab with first meal and 1 tab with dinner  Dispense: 60 tablet; Refill: 0 Check Labs  3. Insomnia, unspecified type Practice Good Sleep Hygiene  MyChart message sent with tips for better sleep  4. Vitamin D deficiency Check Labs  5. Obesity with current BMI 32.81  Candida is currently in the action stage of change. As such, her goal is to continue with weight loss efforts. She has agreed to keeping a food journal and adhering to recommended goals of 1200 calories and 90 protein.   Exercise goals: For substantial health benefits, adults should do at least 150 minutes (2 hours and 30 minutes) a week of moderate-intensity, or 75 minutes (1 hour and 15 minutes) a week of vigorous-intensity aerobic physical activity, or an equivalent combination of moderate- and vigorous-intensity aerobic activity. Aerobic activity should  be performed in episodes of at least 10 minutes, and preferably, it should be spread throughout the week.  Behavioral modification strategies: increasing lean protein intake, decreasing simple carbohydrates, increasing vegetables, increasing water intake, no skipping meals, meal planning and cooking strategies, travel eating strategies, and planning for success.  [name] has agreed to follow-up with our clinic in  4 weeks. She was informed of the importance of frequent follow-up visits to maximize her success with intensive lifestyle modifications for her multiple health conditions.   [name] was informed we would discuss her lab results at her next visit unless there is a critical issue that needs to be addressed sooner. Jule agreed to keep her next visit at the agreed upon time to discuss these results.  Objective:   Blood pressure 131/84, pulse 74, temperature 97.8 F (36.6 C), height 5' (1.524 m), weight 168 lb (76.2 kg), SpO2 95 %. Body mass index is 32.81 kg/m.  General: Cooperative, alert, well developed, in no acute distress. HEENT: Conjunctivae and lids unremarkable. Cardiovascular: Regular rhythm.  Lungs: Normal work of breathing. Neurologic: No focal deficits.   Lab Results  Component Value Date   CREATININE 0.80 04/28/2023   BUN 16 04/28/2023   NA 139 04/28/2023   K 4.1 04/28/2023   CL 103 04/28/2023   CO2 28 04/28/2023   Lab Results  Component Value Date   ALT 18 12/16/2022   AST 15 12/16/2022   ALKPHOS 62 12/16/2022   BILITOT 0.5 12/16/2022   Lab Results  Component Value Date   HGBA1C 5.6 12/16/2022   HGBA1C 5.6 09/09/2022   HGBA1C 5.6 04/20/2022   HGBA1C 5.7 (H) 01/08/2022   HGBA1C 5.6 07/22/2021   Lab Results  Component Value Date   INSULIN 6.5 12/16/2022   INSULIN 6.5 09/09/2022   INSULIN 7.9 04/20/2022   INSULIN 7.7 01/08/2022   INSULIN 12.0 04/03/2020   Lab Results  Component Value Date   TSH 4.09 04/28/2023   Lab Results  Component Value Date   CHOL 259 (H) 09/09/2022   HDL 62 09/09/2022   LDLCALC 145 (H) 09/09/2022   TRIG 287 (H) 09/09/2022   CHOLHDL 4.2 09/09/2022   Lab Results  Component Value Date   VD25OH 37.9 12/16/2022   VD25OH 32.7 09/09/2022   VD25OH 37.1 04/20/2022   Lab Results  Component Value Date   WBC 6.6 09/09/2022   HGB 15.0 09/09/2022   HCT 43.8 09/09/2022   MCV 97 09/09/2022   PLT 354 09/09/2022   No results  found for: "IRON", "TIBC", "FERRITIN"  Attestation Statements:   Reviewed by clinician on day of visit: allergies, medications, problem list, medical history, surgical history, family history, social history, and previous encounter notes.  I have reviewed the above documentation for accuracy and completeness, and I agree with the above. -  Jenya Putz d. Anida Deol, NP-C

## 2023-05-05 LAB — VITAMIN B12: Vitamin B-12: 613 pg/mL (ref 232–1245)

## 2023-05-05 LAB — INSULIN, RANDOM: INSULIN: 8.8 u[IU]/mL (ref 2.6–24.9)

## 2023-05-05 LAB — VITAMIN D 25 HYDROXY (VIT D DEFICIENCY, FRACTURES): Vit D, 25-Hydroxy: 40.7 ng/mL (ref 30.0–100.0)

## 2023-05-05 LAB — HEMOGLOBIN A1C
Est. average glucose Bld gHb Est-mCnc: 123 mg/dL
Hgb A1c MFr Bld: 5.9 % — ABNORMAL HIGH (ref 4.8–5.6)

## 2023-05-10 ENCOUNTER — Encounter: Payer: Self-pay | Admitting: Family Medicine

## 2023-05-10 ENCOUNTER — Ambulatory Visit (INDEPENDENT_AMBULATORY_CARE_PROVIDER_SITE_OTHER): Payer: BC Managed Care – PPO | Admitting: Family Medicine

## 2023-05-10 VITALS — BP 126/70 | HR 84 | Temp 98.0°F | Ht 60.0 in | Wt 173.2 lb

## 2023-05-10 DIAGNOSIS — Z0000 Encounter for general adult medical examination without abnormal findings: Secondary | ICD-10-CM

## 2023-05-10 NOTE — Patient Instructions (Addendum)
Recent labs looked ok.  Mammogram can be scheduled anytime when you are ready.  Good luck at orthopaedic appointment. Let me know if there are any questions and take care!   Preventive Care 39-63 Years Old, Female Preventive care refers to lifestyle choices and visits with your health care provider that can promote health and wellness. Preventive care visits are also called wellness exams. What can I expect for my preventive care visit? Counseling Your health care provider may ask you questions about your: Medical history, including: Past medical problems. Family medical history. Pregnancy history. Current health, including: Menstrual cycle. Method of birth control. Emotional well-being. Home life and relationship well-being. Sexual activity and sexual health. Lifestyle, including: Alcohol, nicotine or tobacco, and drug use. Access to firearms. Diet, exercise, and sleep habits. Work and work Astronomer. Sunscreen use. Safety issues such as seatbelt and bike helmet use. Physical exam Your health care provider will check your: Height and weight. These may be used to calculate your BMI (body mass index). BMI is a measurement that tells if you are at a healthy weight. Waist circumference. This measures the distance around your waistline. This measurement also tells if you are at a healthy weight and may help predict your risk of certain diseases, such as type 2 diabetes and high blood pressure. Heart rate and blood pressure. Body temperature. Skin for abnormal spots. What immunizations do I need?  Vaccines are usually given at various ages, according to a schedule. Your health care provider will recommend vaccines for you based on your age, medical history, and lifestyle or other factors, such as travel or where you work. What tests do I need? Screening Your health care provider may recommend screening tests for certain conditions. This may include: Lipid and cholesterol  levels. Diabetes screening. This is done by checking your blood sugar (glucose) after you have not eaten for a while (fasting). Pelvic exam and Pap test. Hepatitis B test. Hepatitis C test. HIV (human immunodeficiency virus) test. STI (sexually transmitted infection) testing, if you are at risk. Lung cancer screening. Colorectal cancer screening. Mammogram. Talk with your health care provider about when you should start having regular mammograms. This may depend on whether you have a family history of breast cancer. BRCA-related cancer screening. This may be done if you have a family history of breast, ovarian, tubal, or peritoneal cancers. Bone density scan. This is done to screen for osteoporosis. Talk with your health care provider about your test results, treatment options, and if necessary, the need for more tests. Follow these instructions at home: Eating and drinking  Eat a diet that includes fresh fruits and vegetables, whole grains, lean protein, and low-fat dairy products. Take vitamin and mineral supplements as recommended by your health care provider. Do not drink alcohol if: Your health care provider tells you not to drink. You are pregnant, may be pregnant, or are planning to become pregnant. If you drink alcohol: Limit how much you have to 0-1 drink a day. Know how much alcohol is in your drink. In the [place] one drink equals one 12 oz bottle of beer (355 mL), one 5 oz glass of wine (148 mL), or one 1 oz glass of hard liquor (44 mL). Lifestyle Brush your teeth every morning and night with fluoride toothpaste. Floss one time each day. Exercise for at least 30 minutes 5 or more days each week. Do not use any products that contain nicotine or tobacco. These products include cigarettes, chewing tobacco, and vaping devices, such  as e-cigarettes. If you need help quitting, ask your health care provider. Do not use drugs. If you are sexually active, practice safe sex. Use a  condom or other form of protection to prevent STIs. If you do not wish to become pregnant, use a form of birth control. If you plan to become pregnant, see your health care provider for a prepregnancy visit. Take aspirin only as told by your health care provider. Make sure that you understand how much to take and what form to take. Work with your health care provider to find out whether it is safe and beneficial for you to take aspirin daily. Find healthy ways to manage stress, such as: Meditation, yoga, or listening to music. Journaling. Talking to a trusted person. Spending time with friends and family. Minimize exposure to UV radiation to reduce your risk of skin cancer. Safety Always wear your seat belt while driving or riding in a vehicle. Do not drive: If you have been drinking alcohol. Do not ride with someone who has been drinking. When you are tired or distracted. While texting. If you have been using any mind-altering substances or drugs. Wear a helmet and other protective equipment during sports activities. If you have firearms in your house, make sure you follow all gun safety procedures. Seek help if you have been physically or sexually abused. What's next? Visit your health care provider once a year for an annual wellness visit. Ask your health care provider how often you should have your eyes and teeth checked. Stay up to date on all vaccines. This information is not intended to replace advice given to you by your health care provider. Make sure you discuss any questions you have with your health care provider. Document Revised: 04/30/2021 Document Reviewed: 04/30/2021 Elsevier Patient Education  2024 [employer].

## 2023-05-10 NOTE — Progress Notes (Signed)
Subjective:  Patient ID: [name], female    DOB: May 30, 1961  Age: 61 y.o. MRN: [id number]  CC:  Chief Complaint  Patient presents with   Annual Exam    HPI [name] presents for Annual Exam, recent visit few weeks ago to review chronic medications.Referred to dermatology at that time for possible photosensitivity/rash, borderline blood pressure at that time.  Topical Voltaren gel for knee pain and refer to Ortho with back pain and intermittent history of sciatic symptoms, compression fracture history.   Obesity with prediabetes Followed by healthy weight and wellness, recent visit June 18. On metformin 750 mg daily, vitamin D supplementation 50,000 units/week Lab Results  Component Value Date   HGBA1C 5.9 (H) 05/04/2023   Wt Readings from Last 3 Encounters:  05/10/23 173 lb 3.2 oz (78.6 kg)  05/04/23 168 lb (76.2 kg)  04/28/23 171 lb 3.2 oz (77.7 kg)  Last vitamin D Lab Results  Component Value Date   VD25OH 40.7 05/04/2023    Hypertension: enalapril 20 mg daily.  Borderline prior. Controlled on recent visits.  BP Readings from Last 3 Encounters:  05/10/23 126/70  05/04/23 131/84  04/28/23 130/78   Lab Results  Component Value Date   CREATININE 0.80 04/28/2023    Hypothyroidism: Lab Results  Component Value Date   TSH 4.09 04/28/2023  Synthroid 88 mcg daily. Stable recent labs.     Hyperlipidemia: No meds, normal CCS of 0 on 03/16/22. Plan for repeat in 5 yrs.  Lab Results  Component Value Date   CHOL 259 (H) 09/09/2022   HDL 62 09/09/2022   LDLCALC 145 (H) 09/09/2022   TRIG 287 (H) 09/09/2022   CHOLHDL 4.2 09/09/2022   Lab Results  Component Value Date   ALT 18 12/16/2022   AST 15 12/16/2022   ALKPHOS 62 12/16/2022   BILITOT 0.5 12/16/2022        04/28/2023    9:33 AM 09/10/2022    1:45 PM 01/30/2022    9:29 AM 05/28/2021    9:13 AM 04/02/2021   10:43 AM  Depression screen PHQ 2/9  Decreased Interest 0 0 0 1 0  Down,  Depressed, Hopeless 0 0 0 1 0  PHQ - 2 Score 0 0 0 2 0  Altered sleeping 1 2 2  0   Tired, decreased energy 0 2 2 1    Change in appetite 0 2 1 1    Feeling bad or failure about yourself  0 0 0 1   Trouble concentrating 0 0 0 0   Moving slowly or fidgety/restless 0 0 0 0   Suicidal thoughts 0 0 0 0   PHQ-9 Score 1 6 5 5    Difficult doing work/chores    Not difficult at all     Health Maintenance  Topic Date Due   COVID-19 Vaccine (6 - 2023-24 season) 07/17/2022   INFLUENZA VACCINE  06/17/2023   MAMMOGRAM  02/28/2024   Fecal DNA (Cologuard)  02/13/2025   DTaP/Tdap/Td (3 - Td or Tdap) 06/27/2029   Hepatitis C Screening  Completed   HIV Screening  Completed   Zoster Vaccines- Shingrix  Completed   HPV VACCINES  Aged Out  Cologuard negative in 3/32/23.  Has GYN, prior hysterectomy, no pap needed.  Mammogram 02/17/22. No FH of breast CA.   Immunization History  Administered Date(s) Administered   Influenza, Seasonal, Injecte, Preservative Fre 12/18/2012   Influenza,inj,Quad PF,6+ Mos 08/05/2014, 09/16/2017, 09/09/2018, 09/29/2019, 08/27/2020   PFIZER(Purple Top)SARS-COV-2 Vaccination 01/15/2020,  02/12/2020, 08/13/2020, 03/24/2021, 08/09/2021   Tdap 06/16/2009, 06/28/2019   Zoster Recombinat (Shingrix) 01/30/2022, 10/26/2022  Covid booster and flu vaccine recommended in fall.   No results found. Wears glasses.  Due for optho - will be rescheduling.   Dental: every 6 months  Alcohol: 1-2 per week.   Tobacco: none  Exercise: back issues, but seeing ortho tomorrow. Pool based exercise daily.    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   Anemia    Back pain    Blood transfusion without reported diagnosis    post-operatively in 20s after L1 fracture with spinal cord injury after horseback riding.   Chronic kidney disease    Tuberous Sclerosis; [facility].   Edema of both lower extremities    Hyperlipidemia    Hypertension    Hypothyroidism    Lower back pain    Pre-diabetes    Thyroid disease    Vitamin D deficiency    Past Surgical History:  Procedure Laterality Date   ABDOMINAL HYSTERECTOMY     DUB; ovaries remaining.   CESAREAN SECTION     MENISCUS REPAIR     Sleep Study  12/17/2010   No sleep disordered breathing or periodic limb mvmets.  Excess light sleep.   SPINE SURGERY     L1 fracture with spinal cord injury with horseback riding.   TOTAL VAGINAL HYSTERECTOMY  2008   TUBAL LIGATION     Allergies  Allergen Reactions   Saxenda [Liraglutide -Weight Management] Nausea And Vomiting    With borderline high lipase - concern for possible pancreatitis.    Sulfa Antibiotics Hives   Lipitor [Atorvastatin] Other (See Comments)    Fatigue and confusion   Prior to Admission medications   Medication Sig Start Date End Date Taking? Authorizing Provider  enalapril (VASOTEC) 20 MG tablet Take 1 tablet (20 mg total) by mouth daily. 04/28/23  Yes [name], MD  levothyroxine (SYNTHROID) 88 MCG tablet Take 1 tablet (88 mcg total) by mouth daily. 04/28/23  Yes [name], MD  metFORMIN (GLUCOPHAGE-XR) 750 MG 24 hr tablet 1 tab with first meal and 1 tab with dinner 05/04/23  Yes Danford, Orpha Bur D, NP  Vitamin D, Ergocalciferol, (DRISDOL) 1.25 MG (50000 UNIT) CAPS capsule Take 1 capsule (50,000 Units total) by mouth every 7 (seven) days. 02/09/23  Yes Danford, Jinny Blossom, NP   Social History    Socioeconomic History   Marital status: Married    Spouse name: Burnis Kingfisher   Number of children: Not on file   Years of education: Not on file   Highest education level: Master's degree (e.g., MA, MS, MEng, MEd, MSW, MBA)  Occupational History   Occupation: Tourist information centre manager  Tobacco Use   Smoking status: Never   Smokeless tobacco: Never  Vaping Use   Vaping Use: Never used  Substance and Sexual Activity   Alcohol use: Yes    Comment: occ  Drug use: No   Sexual activity: Yes    Birth control/protection: None  Other Topics Concern   Not on file  Social History Narrative   Marital status: married x 20+ years; happily married.      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      Tobacco; none      Alcohol:  Socially      Drugs: none      Exercise: sporadic   Social Determinants of Health   Financial Resource Strain: Low Risk  (04/24/2023)   Overall Financial Resource Strain (CARDIA)    Difficulty of Paying Living Expenses: Not hard at all  Food Insecurity: No Food Insecurity (04/24/2023)   Hunger Vital Sign    Worried About Running Out of Food in the Last Year: Never true    Ran Out of Food in the Last Year: Never true  Transportation Needs: No Transportation Needs (04/24/2023)   PRAPARE - Administrator, Civil Service (Medical): No    Lack of Transportation (Non-Medical): No  Physical Activity: Insufficiently Active (04/24/2023)   Exercise Vital Sign    Days of Exercise per Week: 4 days    Minutes of Exercise per Session: 30 min  Stress: No Stress Concern Present (04/24/2023)   [employer] of Occupational Health - Occupational Stress Questionnaire    Feeling of Stress : Only a little  Social Connections: Moderately Isolated (04/24/2023)   Social Connection and Isolation Panel [NHANES]    Frequency of Communication with Friends and Family: More than three times a week     Frequency of Social Gatherings with Friends and Family: More than three times a week    Attends Religious Services: Never    Database administrator or Organizations: No    Attends Engineer, structural: Not on file    Marital Status: Married  Catering manager Violence: Not on file    Review of Systems  13 point review of systems per patient health survey noted.  Negative other than as indicated above or in HPI and back, knee pain as discussed last visit.   Objective:   Vitals:   05/10/23 1350  BP: 126/70  Pulse: 84  Temp: 98 F (36.7 C)  TempSrc: Temporal  SpO2: 98%  Weight: 173 lb 3.2 oz (78.6 kg)  Height: 5' (1.524 m)     Physical Exam Constitutional:      Appearance: She is well-developed.  HENT:     Head: Normocephalic and atraumatic.     Right Ear: External ear normal.     Left Ear: External ear normal.  Eyes:     Conjunctiva/sclera: Conjunctivae normal.     Pupils: Pupils are equal, round, and reactive to light.  Neck:     Thyroid: No thyromegaly.  Cardiovascular:     Rate and Rhythm: Normal rate and regular rhythm.     Heart sounds: Normal heart sounds. No murmur heard. Pulmonary:     Effort: Pulmonary effort is normal. No respiratory distress.     Breath sounds: Normal breath sounds. No wheezing.  Abdominal:     General: Bowel sounds are normal.     Palpations: Abdomen is soft.     Tenderness: There is no abdominal tenderness.  Musculoskeletal:        General: No tenderness. Normal range of motion.     Cervical  back: Normal range of motion and neck supple.  Lymphadenopathy:     Cervical: No cervical adenopathy.  Skin:    General: Skin is warm and dry.     Findings: No rash.  Neurological:     Mental Status: She is alert and oriented to person, place, and time.  Psychiatric:        Behavior: Behavior normal.        Thought Content: Thought content normal.     Assessment & Plan:  [name] is a 62 y.o. female . Annual physical  exam -anticipatory guidance as below in AVS, screening labs recently reviewed, hold on lipid panel given coronary calcium score of 0 last year, consider repeat scoring in 5 years.. Health maintenance items as above in HPI discussed/recommended as applicable.  Option of updated mammogram discussed.  Flu and COVID vaccines in the fall. -Followed by healthy weight and wellness, recent labs reassuring.  Has orthopedic follow-up for back, knee pain soon.  No orders of the defined types were placed in this encounter.  Patient Instructions  Recent labs looked ok.  Mammogram can be scheduled anytime when you are ready.  Good luck at orthopaedic appointment. Let me know if there are any questions and take care!   Preventive Care 77-2 Years Old, Female Preventive care refers to lifestyle choices and visits with your health care provider that can promote health and wellness. Preventive care visits are also called wellness exams. What can I expect for my preventive care visit? Counseling Your health care provider may ask you questions about your: Medical history, including: Past medical problems. Family medical history. Pregnancy history. Current health, including: Menstrual cycle. Method of birth control. Emotional well-being. Home life and relationship well-being. Sexual activity and sexual health. Lifestyle, including: Alcohol, nicotine or tobacco, and drug use. Access to firearms. Diet, exercise, and sleep habits. Work and work Astronomer. Sunscreen use. Safety issues such as seatbelt and bike helmet use. Physical exam Your health care provider will check your: Height and weight. These may be used to calculate your BMI (body mass index). BMI is a measurement that tells if you are at a healthy weight. Waist circumference. This measures the distance around your waistline. This measurement also tells if you are at a healthy weight and may help predict your risk of certain diseases, such  as type 2 diabetes and high blood pressure. Heart rate and blood pressure. Body temperature. Skin for abnormal spots. What immunizations do I need?  Vaccines are usually given at various ages, according to a schedule. Your health care provider will recommend vaccines for you based on your age, medical history, and lifestyle or other factors, such as travel or where you work. What tests do I need? Screening Your health care provider may recommend screening tests for certain conditions. This may include: Lipid and cholesterol levels. Diabetes screening. This is done by checking your blood sugar (glucose) after you have not eaten for a while (fasting). Pelvic exam and Pap test. Hepatitis B test. Hepatitis C test. HIV (human immunodeficiency virus) test. STI (sexually transmitted infection) testing, if you are at risk. Lung cancer screening. Colorectal cancer screening. Mammogram. Talk with your health care provider about when you should start having regular mammograms. This may depend on whether you have a family history of breast cancer. BRCA-related cancer screening. This may be done if you have a family history of breast, ovarian, tubal, or peritoneal cancers. Bone density scan. This is done to screen for osteoporosis. Talk  with your health care provider about your test results, treatment options, and if necessary, the need for more tests. Follow these instructions at home: Eating and drinking  Eat a diet that includes fresh fruits and vegetables, whole grains, lean protein, and low-fat dairy products. Take vitamin and mineral supplements as recommended by your health care provider. Do not drink alcohol if: Your health care provider tells you not to drink. You are pregnant, may be pregnant, or are planning to become pregnant. If you drink alcohol: Limit how much you have to 0-1 drink a day. Know how much alcohol is in your drink. In the [place] one drink equals one 12 oz bottle of beer  (355 mL), one 5 oz glass of wine (148 mL), or one 1 oz glass of hard liquor (44 mL). Lifestyle Brush your teeth every morning and night with fluoride toothpaste. Floss one time each day. Exercise for at least 30 minutes 5 or more days each week. Do not use any products that contain nicotine or tobacco. These products include cigarettes, chewing tobacco, and vaping devices, such as e-cigarettes. If you need help quitting, ask your health care provider. Do not use drugs. If you are sexually active, practice safe sex. Use a condom or other form of protection to prevent STIs. If you do not wish to become pregnant, use a form of birth control. If you plan to become pregnant, see your health care provider for a prepregnancy visit. Take aspirin only as told by your health care provider. Make sure that you understand how much to take and what form to take. Work with your health care provider to find out whether it is safe and beneficial for you to take aspirin daily. Find healthy ways to manage stress, such as: Meditation, yoga, or listening to music. Journaling. Talking to a trusted person. Spending time with friends and family. Minimize exposure to UV radiation to reduce your risk of skin cancer. Safety Always wear your seat belt while driving or riding in a vehicle. Do not drive: If you have been drinking alcohol. Do not ride with someone who has been drinking. When you are tired or distracted. While texting. If you have been using any mind-altering substances or drugs. Wear a helmet and other protective equipment during sports activities. If you have firearms in your house, make sure you follow all gun safety procedures. Seek help if you have been physically or sexually abused. What's next? Visit your health care provider once a year for an annual wellness visit. Ask your health care provider how often you should have your eyes and teeth checked. Stay up to date on all vaccines. This  information is not intended to replace advice given to you by your health care provider. Make sure you discuss any questions you have with your health care provider. Document Revised: 04/30/2021 Document Reviewed: 04/30/2021 Elsevier Patient Education  2024 [employer]      Signed,   Meredith Staggers, MD [facility], [facility] 05/10/23 2:30 PM

## 2023-05-11 ENCOUNTER — Other Ambulatory Visit (INDEPENDENT_AMBULATORY_CARE_PROVIDER_SITE_OTHER): Payer: BC Managed Care – PPO

## 2023-05-11 ENCOUNTER — Encounter: Payer: Self-pay | Admitting: Physician Assistant

## 2023-05-11 ENCOUNTER — Ambulatory Visit: Payer: BC Managed Care – PPO | Admitting: Physician Assistant

## 2023-05-11 DIAGNOSIS — M1712 Unilateral primary osteoarthritis, left knee: Secondary | ICD-10-CM | POA: Diagnosis not present

## 2023-05-11 DIAGNOSIS — G8929 Other chronic pain: Secondary | ICD-10-CM

## 2023-05-11 DIAGNOSIS — M170 Bilateral primary osteoarthritis of knee: Secondary | ICD-10-CM

## 2023-05-11 DIAGNOSIS — M25562 Pain in left knee: Secondary | ICD-10-CM | POA: Diagnosis not present

## 2023-05-11 DIAGNOSIS — M1711 Unilateral primary osteoarthritis, right knee: Secondary | ICD-10-CM

## 2023-05-11 DIAGNOSIS — M545 Low back pain, unspecified: Secondary | ICD-10-CM

## 2023-05-11 MED ORDER — METHYLPREDNISOLONE ACETATE 40 MG/ML IJ SUSP
13.3300 mg | INTRAMUSCULAR | Status: AC | PRN
Start: 2023-05-11 — End: 2023-05-11
  Administered 2023-05-11: 13.33 mg via INTRA_ARTICULAR

## 2023-05-11 MED ORDER — MELOXICAM 7.5 MG PO TABS: 7.5000 mg | ORAL_TABLET | Freq: Every day | ORAL | 2 refills | Status: AC | PRN

## 2023-05-11 MED ORDER — BUPIVACAINE HCL 0.25 % IJ SOLN
0.6600 mL | INTRAMUSCULAR | Status: AC | PRN
Start: 2023-05-11 — End: 2023-05-11
  Administered 2023-05-11: .66 mL via INTRA_ARTICULAR

## 2023-05-11 MED ORDER — LIDOCAINE HCL 1 % IJ SOLN
3.0000 mL | INTRAMUSCULAR | Status: AC | PRN
Start: 2023-05-11 — End: 2023-05-11
  Administered 2023-05-11: 3 mL

## 2023-05-11 NOTE — Progress Notes (Signed)
Office Visit Note   Patient: [name]           Date of Birth: [birth year]           MRN: [id number] Visit Date: 05/11/2023              Requested by: [name], MD [address] [place],  [place] [address] PCP: [name], MD   Assessment & Plan: Visit Diagnoses:  1. Chronic bilateral low back pain, unspecified whether sciatica present   2. Bilateral primary osteoarthritis of knee     Plan: Impression is chronic low back pain and bilateral knee osteoarthritis.  In regards to her back, I believe it is okay to continue the meloxicam as infrequently as she is taking it as her lab work looks okay and she does not have a history of GI ulcer.  If her symptoms worsen or if she for some reason needs to stop taking the meloxicam, she will follow-up with Dr. Christell Constant for further evaluation and treatment recommendation.  In regards to her knees, we have discussed proceeding with cortisone injections today for which she is agreeable to.  She will follow-up with [place] as needed.  Follow-Up Instructions: Return if symptoms worsen or fail to improve.   Orders:  Orders Placed This Encounter  Procedures   Large Joint Inj: bilateral knee   XR Lumbar Spine 2-3 Views   XR KNEE 3 VIEW LEFT   No orders of the defined types were placed in this encounter.     Procedures: Large Joint Inj: bilateral knee on 05/11/2023 2:55 PM Indications: pain Details: 22 G needle, anterolateral approach Medications (Right): 0.66 mL bupivacaine 0.25 %; 3 mL lidocaine 1 %; 13.33 mg methylPREDNISolone acetate 40 MG/ML Medications (Left): 0.66 mL bupivacaine 0.25 %; 3 mL lidocaine 1 %; 13.33 mg methylPREDNISolone acetate 40 MG/ML      Clinical Data: No additional findings.   Subjective: Chief Complaint  Patient presents with   Left Knee - Pain   Lower Back - Pain    HPI patient is a pleasant 62 year old female who comes in today with low back and bilateral knee pain.  In regards to her  back, she is status post lumbar surgery back in 1989 following a horseback riding injury.  She has had some discomfort since which has worsened over the years.  Symptoms were increased back in April/May after a trip to [place] where she walked quite a bit for a few weeks.  She also notes that she fell off of her office chair about 3 weeks ago.  The pain she is having is across the entire lower back into the buttocks and down the posterolateral legs.  She notes occasional paresthesias to both legs.  Symptoms are worse when she is walking as well as with deep water jogging.  She takes meloxicam which significantly helps but notes that her PCP does not think she should take this.  In regards to her knees, she is status post bilateral knee arthroscopy back in 2015.  She has had some pain as well as giving way sensation to the left knee and weakness to the right since.  The pain is to the medial aspect.  Stairclimbing seems to worsen her symptoms.  No previous cortisone injection to either knee.  Review of Systems as detailed in HPI.  All others reviewed and are negative.   Objective: Vital Signs: There were no vitals taken for this visit.  Physical Exam  well-developed well-nourished female no acute distress.  Alert and oriented x 3.  Ortho Exam lumbar spine exam: Increased pain with lumbar flexion and extension.  Negative straight leg raise both sides.  No focal weakness.  Bilateral knee exam: Range of motion 0 to 110 degrees.  Medial joint line tenderness.  Moderate patellofemoral crepitus.  Ligaments are stable.  She is neurovascular intact distally.  Specialty Comments:  No specialty comments available.  Imaging: XR Lumbar Spine 2-3 Views  Result Date: 05/11/2023 X-rays demonstrate multilevel degenerative changes with hardware at T11-L3.  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   Anemia    Back pain    Blood transfusion without reported diagnosis    post-operatively in 20s after L1 fracture with spinal cord injury after horseback riding.   Chronic kidney disease    Tuberous Sclerosis; [facility] at [facility].   Edema of both lower extremities    Hyperlipidemia    Hypertension    Hypothyroidism    Lower back pain    Pre-diabetes    Thyroid disease    Vitamin D deficiency     Family History  Problem Relation Age of Onset   Thyroid disease Mother    Arthritis Mother    Asthma Mother    Migraines Mother    High blood pressure Mother    Sleep apnea Mother    Obesity Mother    Heart failure Father    Hyperlipidemia Father    Heart disease Father 58       AMI age 29; CABG at 81.   Arthritis Father    High Cholesterol Father    AAA (abdominal aortic aneurysm) Father     Past Surgical History:  Procedure Laterality Date   ABDOMINAL HYSTERECTOMY     DUB; ovaries remaining.   CESAREAN SECTION     MENISCUS REPAIR     Sleep Study  12/17/2010   No sleep disordered breathing or periodic limb mvmets.  Excess light sleep.   SPINE SURGERY     L1 fracture with spinal cord injury with horseback riding.   TOTAL VAGINAL HYSTERECTOMY  2008   TUBAL LIGATION     Social History   Occupational History   Occupation: Tourist information centre manager  Tobacco Use   Smoking status: Never    Smokeless tobacco: Never  Vaping Use   Vaping Use: Never used  Substance and Sexual Activity   Alcohol use: Yes    Comment: occ   Drug use: No   Sexual activity: Yes    Birth control/protection: None

## 2023-05-11 NOTE — Addendum Note (Signed)
Addended by: Cristie Hem on: 05/11/2023 03:19 PM   Modules accepted: Orders

## 2023-05-18 LAB — HM DIABETES EYE EXAM

## 2023-05-28 ENCOUNTER — Other Ambulatory Visit (INDEPENDENT_AMBULATORY_CARE_PROVIDER_SITE_OTHER): Payer: Self-pay | Admitting: Adult Health

## 2023-05-28 DIAGNOSIS — R7303 Prediabetes: Secondary | ICD-10-CM

## 2023-06-01 ENCOUNTER — Other Ambulatory Visit (INDEPENDENT_AMBULATORY_CARE_PROVIDER_SITE_OTHER): Payer: Self-pay | Admitting: Adult Health

## 2023-06-01 DIAGNOSIS — R7303 Prediabetes: Secondary | ICD-10-CM

## 2023-06-07 ENCOUNTER — Encounter (INDEPENDENT_AMBULATORY_CARE_PROVIDER_SITE_OTHER): Payer: Self-pay | Admitting: Adult Health

## 2023-06-07 ENCOUNTER — Ambulatory Visit (INDEPENDENT_AMBULATORY_CARE_PROVIDER_SITE_OTHER): Payer: BC Managed Care – PPO | Admitting: Adult Health

## 2023-06-07 VITALS — BP 137/80 | HR 71 | Temp 98.3°F | Ht 60.0 in | Wt 172.0 lb

## 2023-06-07 DIAGNOSIS — Z6833 Body mass index (BMI) 33.0-33.9, adult: Secondary | ICD-10-CM

## 2023-06-07 DIAGNOSIS — E669 Obesity, unspecified: Secondary | ICD-10-CM

## 2023-06-07 DIAGNOSIS — R7303 Prediabetes: Secondary | ICD-10-CM

## 2023-06-07 DIAGNOSIS — E559 Vitamin D deficiency, unspecified: Secondary | ICD-10-CM | POA: Diagnosis not present

## 2023-06-07 MED ORDER — METFORMIN HCL ER 750 MG PO TB24
ORAL_TABLET | ORAL | 0 refills | Status: DC
Start: 2023-06-07 — End: 2023-07-07

## 2023-06-07 MED ORDER — VITAMIN D (ERGOCALCIFEROL) 1.25 MG (50000 UNIT) PO CAPS
50000.0000 [IU] | ORAL_CAPSULE | ORAL | 0 refills | Status: DC
Start: 2023-06-07 — End: 2023-11-08

## 2023-06-07 NOTE — Progress Notes (Signed)
WEIGHT SUMMARY AND BIOMETRICS  Vitals Temp: 98.3 F (36.8 C) BP: 137/80 Pulse Rate: 71 SpO2: 96 %   Anthropometric Measurements Height: 5' (1.524 m) Weight: 172 lb (78 kg) BMI (Calculated): 33.59 Weight at Last Visit: 168lb Weight Lost Since Last Visit: 0 Weight Gained Since Last Visit: 4lb Starting Weight: 177lb Total Weight Loss (lbs): 7 lb (3.175 kg)   Body Composition  Body Fat %: 39.9 % Fat Mass (lbs): 68.8 lbs Muscle Mass (lbs): 98.6 lbs Total Body Water (lbs): 68.8 lbs Visceral Fat Rating : 11   Other Clinical Data Fasting: No Labs: No Today's Visit #: 17 Starting Date: 04/03/20    Chief Complaint:   OBESITY [name] is here to discuss her progress with her obesity treatment plan. She is on the keeping a food journal and adhering to recommended goals of 1200 calories and 90 protein and states she is following her eating plan approximately 50 % of the time. She states she is exercising Swimming/Walking 30 minutes 7 times per week.   Interim History [name] reports 100% plan compliance the last two weeks in June and first week of July. She vacationed at [facility] for a week and decreased plan compliance to 25%. Now home- she and her husband are committed to following plan 100% for the next 8-10 weeks. In Sept will re-evaluate health status and make adjustments.  Reviewed Bioimpedance results with pt: Muscle Mass: +1.8 lbs Adipose Mass: +2.2 lbs  Subjective:   1. Vitamin D deficiency Discussed Labs  Latest Reference Range & Units 12/16/22 11:58 05/04/23 09:49  Vitamin D, 25-Hydroxy 30.0 - 100.0 ng/mL 37.9 40.7  Level slightly improved She is currently on weekly Ergocalciferol- denies N/V/Muscle Weakness  2. Pre-diabetes Discussed Labs Lab Results  Component Value Date   HGBA1C 5.9 (H) 05/04/2023   HGBA1C 5.6 12/16/2022   HGBA1C 5.6 09/09/2022  Level worsening She is on Metformin XR 750mg - 2 times daily She denies GI upset Of  note- previously on GLP-1 therapy, stopped due to medication SE  Assessment/Plan:   1. Vitamin D deficiency Refill - Vitamin D, Ergocalciferol, (DRISDOL) 1.25 MG (50000 UNIT) CAPS capsule; Take 1 capsule (50,000 Units total) by mouth every 7 (seven) days.  Dispense: 8 capsule; Refill: 0  2. Pre-diabetes Refill - metFORMIN (GLUCOPHAGE-XR) 750 MG 24 hr tablet; 1 tab with first meal and 1 tab with dinner  Dispense: 60 tablet; Refill: 0 Increase regular exercise  3. Obesity with current BMI 33.59  [name] is currently in the action stage of change. As such, her goal is to continue with weight loss efforts. She has agreed to keeping a food journal and adhering to recommended goals of 1200 calories and 90 protein.   Exercise goals: For substantial health benefits, adults should do at least 150 minutes (2 hours and 30 minutes) a week of moderate-intensity, or 75 minutes (1 hour and 15 minutes) a week of vigorous-intensity aerobic physical activity, or an equivalent combination of moderate- and vigorous-intensity aerobic activity. Aerobic activity should be performed in episodes of at least 10 minutes, and preferably, it should be spread throughout the week.  Behavioral modification strategies: increasing lean protein intake, decreasing simple carbohydrates, increasing vegetables, increasing water intake, no skipping meals, meal planning and cooking strategies, and planning for success.  [name] has agreed to follow-up with our clinic in 4 weeks. She was informed of the importance of frequent follow-up visits to maximize her success with intensive lifestyle modifications for her multiple health conditions.  Objective:   Blood pressure 137/80, pulse 71, temperature 98.3 F (36.8 C), height 5' (1.524 m), weight 172 lb (78 kg), SpO2 96%. Body mass index is 33.59 kg/m.  General: Cooperative, alert, well developed, in no acute distress. HEENT: Conjunctivae and lids unremarkable. Cardiovascular:  Regular rhythm.  Lungs: Normal work of breathing. Neurologic: No focal deficits.   Lab Results  Component Value Date   CREATININE 0.80 04/28/2023   BUN 16 04/28/2023   NA 139 04/28/2023   K 4.1 04/28/2023   CL 103 04/28/2023   CO2 28 04/28/2023   Lab Results  Component Value Date   ALT 18 12/16/2022   AST 15 12/16/2022   ALKPHOS 62 12/16/2022   BILITOT 0.5 12/16/2022   Lab Results  Component Value Date   HGBA1C 5.9 (H) 05/04/2023   HGBA1C 5.6 12/16/2022   HGBA1C 5.6 09/09/2022   HGBA1C 5.6 04/20/2022   HGBA1C 5.7 (H) 01/08/2022   Lab Results  Component Value Date   INSULIN 8.8 05/04/2023   INSULIN 6.5 12/16/2022   INSULIN 6.5 09/09/2022   INSULIN 7.9 04/20/2022   INSULIN 7.7 01/08/2022   Lab Results  Component Value Date   TSH 4.09 04/28/2023   Lab Results  Component Value Date   CHOL 259 (H) 09/09/2022   HDL 62 09/09/2022   LDLCALC 145 (H) 09/09/2022   TRIG 287 (H) 09/09/2022   CHOLHDL 4.2 09/09/2022   Lab Results  Component Value Date   VD25OH 40.7 05/04/2023   VD25OH 37.9 12/16/2022   VD25OH 32.7 09/09/2022   Lab Results  Component Value Date   WBC 6.6 09/09/2022   HGB 15.0 09/09/2022   HCT 43.8 09/09/2022   MCV 97 09/09/2022   PLT 354 09/09/2022   No results found for: "IRON", "TIBC", "FERRITIN"  Attestation Statements:   Reviewed by clinician on day of visit: allergies, medications, problem list, medical history, surgical history, family history, social history, and previous encounter notes.  I have reviewed the above documentation for accuracy and completeness, and I agree with the above. -  Teaira Croft d. Monigue Spraggins, NP-C

## 2023-07-07 ENCOUNTER — Ambulatory Visit (INDEPENDENT_AMBULATORY_CARE_PROVIDER_SITE_OTHER): Payer: BC Managed Care – PPO | Admitting: Adult Health

## 2023-07-07 ENCOUNTER — Encounter (INDEPENDENT_AMBULATORY_CARE_PROVIDER_SITE_OTHER): Payer: Self-pay | Admitting: Adult Health

## 2023-07-07 VITALS — BP 129/78 | HR 66 | Temp 97.8°F | Ht 60.0 in | Wt 168.0 lb

## 2023-07-07 DIAGNOSIS — R7303 Prediabetes: Secondary | ICD-10-CM | POA: Diagnosis not present

## 2023-07-07 DIAGNOSIS — E039 Hypothyroidism, unspecified: Secondary | ICD-10-CM | POA: Diagnosis not present

## 2023-07-07 DIAGNOSIS — Z6832 Body mass index (BMI) 32.0-32.9, adult: Secondary | ICD-10-CM

## 2023-07-07 DIAGNOSIS — E559 Vitamin D deficiency, unspecified: Secondary | ICD-10-CM

## 2023-07-07 DIAGNOSIS — E669 Obesity, unspecified: Secondary | ICD-10-CM

## 2023-07-07 MED ORDER — METFORMIN HCL ER 750 MG PO TB24
ORAL_TABLET | ORAL | 0 refills | Status: DC
Start: 2023-07-07 — End: 2023-09-06

## 2023-07-07 NOTE — Progress Notes (Signed)
WEIGHT SUMMARY AND BIOMETRICS  Vitals Temp: 97.8 F (36.6 C) BP: 129/78 Pulse Rate: 66 SpO2: 96 %   Anthropometric Measurements Height: 5' (1.524 m) Weight: 168 lb (76.2 kg) BMI (Calculated): 32.81 Weight at Last Visit: 172lb Weight Lost Since Last Visit: 4lb Weight Gained Since Last Visit: 0 Starting Weight: 177lb Total Weight Loss (lbs): 11 lb (4.99 kg)   Body Composition  Body Fat %: 38.4 % Fat Mass (lbs): 64.6 lbs Muscle Mass (lbs): 98.4 lbs Total Body Water (lbs): 67.6 lbs Visceral Fat Rating : 11   Other Clinical Data Fasting: no Labs: no Today's Visit #: 18 Starting Date: 04/03/20    Chief Complaint:   OBESITY [name] is here to discuss her progress with her obesity treatment plan. She is on the keeping a food journal and adhering to recommended goals of 1200 calories and 90 protein and states she is following her eating plan approximately 80 % of the time. She states she is exercising Walking and Swimming 40-45 minutes 7 times per week.   Interim History:   [name] and her extended family will travel to [facility] for week long vacation in early Sept! She will be able "eat on plan" easily for breakfast and lunch, dinners are communal/shared. Discussed strategies to remain on plan.  She will walk on beach frequently and continue to remain well hydrated.  Exercise-Brisk Walking and Swimming 40-45 mins everyday of the week Hydration-she estimates to drink 3 L of "fizzy water" a day  Reviewed Bioimpedance results with pt: Muscle Mass: -0.2 lb Adipose Mass: -4.2 lbs  Subjective:   1. Pre-diabetes Lab Results  Component Value Date   HGBA1C 5.9 (H) 05/04/2023   HGBA1C 5.6 12/16/2022   HGBA1C 5.6 09/09/2022   Currently on Metformin XR 750mg  1 tab with first meal of day and 1 tab with dinner. She is wondering is Metformin is helpful/effective. She will occasionally forget a dose and will not experience increase polyphagia Of Note-  Saxenda caused N/V/elevated Lipase   Latest Reference Range & Units 04/28/23 10:45  GFR >60.00 mL/min 78.67   2. Hypothyroidism, unspecified type  Latest Reference Range & Units 10/26/22 11:56 04/28/23 10:45  TSH 0.35 - 5.50 uIU/mL 2.75 4.09   PCP manages daily   levothyroxine (SYNTHROID) 88 MCG tablet    3. Vitamin D deficiency  Latest Reference Range & Units 12/16/22 11:58 05/04/23 09:49  Vitamin D, 25-Hydroxy 30.0 - 100.0 ng/mL 37.9 40.7   She is on weekly Ergocalciferol- denies N/V/Muscle Weakness  Assessment/Plan:   1. Pre-diabetes Refill - metFORMIN (GLUCOPHAGE-XR) 750 MG 24 hr tablet; 1 tab with first meal and 1 tab with dinner  Dispense: 60 tablet; Refill: 0  2. Hypothyroidism, unspecified type Continue daily levothyroxine (SYNTHROID) 88 MCG tablet  3. Vitamin D deficiency Continue weekly Ergocalciferol  4. Obesity with current BMI 32.81  [name] is currently in the action stage of change. As such, her goal is to continue with weight loss efforts. She has agreed to keeping a food journal and adhering to recommended goals of 1200 calories and 90+ protein.   Exercise goals: For substantial health benefits, adults should do at least 150 minutes (2 hours and 30 minutes) a week of moderate-intensity, or 75 minutes (1 hour and 15 minutes) a week of vigorous-intensity aerobic physical activity, or an equivalent combination of moderate- and vigorous-intensity aerobic activity. Aerobic activity should be performed in episodes of at least 10 minutes, and preferably, it should be spread throughout  the week.  Behavioral modification strategies: increasing lean protein intake, decreasing simple carbohydrates, increasing vegetables, increasing water intake, no skipping meals, meal planning and cooking strategies, keeping healthy foods in the home, travel eating strategies, and planning for success.  Chanie has agreed to follow-up with our clinic in 5 weeks. She was informed of  the importance of frequent follow-up visits to maximize her success with intensive lifestyle modifications for her multiple health conditions.   Check Fasting Labs at next OV  Objective:   Blood pressure 129/78, pulse 66, temperature 97.8 F (36.6 C), height 5' (1.524 m), weight 168 lb (76.2 kg), SpO2 96%. Body mass index is 32.81 kg/m.  General: Cooperative, alert, well developed, in no acute distress. HEENT: Conjunctivae and lids unremarkable. Cardiovascular: Regular rhythm.  Lungs: Normal work of breathing. Neurologic: No focal deficits.   Lab Results  Component Value Date   CREATININE 0.80 04/28/2023   BUN 16 04/28/2023   NA 139 04/28/2023   K 4.1 04/28/2023   CL 103 04/28/2023   CO2 28 04/28/2023   Lab Results  Component Value Date   ALT 18 12/16/2022   AST 15 12/16/2022   ALKPHOS 62 12/16/2022   BILITOT 0.5 12/16/2022   Lab Results  Component Value Date   HGBA1C 5.9 (H) 05/04/2023   HGBA1C 5.6 12/16/2022   HGBA1C 5.6 09/09/2022   HGBA1C 5.6 04/20/2022   HGBA1C 5.7 (H) 01/08/2022   Lab Results  Component Value Date   INSULIN 8.8 05/04/2023   INSULIN 6.5 12/16/2022   INSULIN 6.5 09/09/2022   INSULIN 7.9 04/20/2022   INSULIN 7.7 01/08/2022   Lab Results  Component Value Date   TSH 4.09 04/28/2023   Lab Results  Component Value Date   CHOL 259 (H) 09/09/2022   HDL 62 09/09/2022   LDLCALC 145 (H) 09/09/2022   TRIG 287 (H) 09/09/2022   CHOLHDL 4.2 09/09/2022   Lab Results  Component Value Date   VD25OH 40.7 05/04/2023   VD25OH 37.9 12/16/2022   VD25OH 32.7 09/09/2022   Lab Results  Component Value Date   WBC 6.6 09/09/2022   HGB 15.0 09/09/2022   HCT 43.8 09/09/2022   MCV 97 09/09/2022   PLT 354 09/09/2022   No results found for: "IRON", "TIBC", "FERRITIN"  Attestation Statements:   Reviewed by clinician on day of visit: allergies, medications, problem list, medical history, surgical history, family history, social history, and previous  encounter notes.  I have reviewed the above documentation for accuracy and completeness, and I agree with the above. -  Edenilson Austad d. Megyn Leng, NP-C

## 2023-07-20 ENCOUNTER — Ambulatory Visit (HOSPITAL_COMMUNITY)
Admission: RE | Admit: 2023-07-20 | Discharge: 2023-07-20 | Disposition: A | Discharge status: Home / Self Care | Payer: BC Managed Care – PPO | Source: Ambulatory Visit | Attending: Family Medicine | Admitting: Family Medicine

## 2023-07-20 ENCOUNTER — Encounter (HOSPITAL_COMMUNITY): Payer: Self-pay

## 2023-07-20 VITALS — BP 137/83 | HR 58 | Temp 98.0°F | Resp 16

## 2023-07-20 DIAGNOSIS — S91312A Laceration without foreign body, left foot, initial encounter: Secondary | ICD-10-CM

## 2023-07-20 NOTE — ED Provider Notes (Signed)
[facility]    CSN: [id number] Arrival date & time: 07/20/23  1243      History   Chief Complaint Chief Complaint  Patient presents with   Wound Check    Entered by patient    HPI [name] is a 62 y.o. female.   Patient here today for evaluation of laceration to her left heel that occurred last night when a screen door accidentally caught the back of her foot when she was walking inside.  She states that she did have more significant bleeding but now continues to have some oozing which concerns her.  She denies any numbness or tingling.  Tetanus vaccination up-to-date  The history is provided by the patient.  Wound Check Pertinent negatives include no abdominal pain.    Past Medical History:  Diagnosis Date   Allergy    dust mite allergies; Zyrtec.   Anemia    Back pain    Blood transfusion without reported diagnosis    post-operatively in 20s after L1 fracture with spinal cord injury after horseback riding.   Chronic kidney disease    Tuberous Sclerosis; [facility] at [facility].   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   CESAREAN SECTION     MENISCUS REPAIR     Sleep Study  12/17/2010   No sleep disordered breathing  or periodic limb mvmets.  Excess light sleep.   SPINE SURGERY     L1 fracture with spinal cord injury with horseback riding.   TOTAL VAGINAL HYSTERECTOMY  2008   TUBAL LIGATION      OB History     Gravida  1   Para  1   Term      Preterm      AB      Living         SAB      IAB      Ectopic      Multiple      Live Births               Home Medications    Prior to Admission medications   Medication Sig Start Date End Date Taking? Authorizing Provider  enalapril (VASOTEC) 20 MG tablet Take 1 tablet (20 mg total) by mouth daily. 04/28/23  Yes [name], MD  levothyroxine (SYNTHROID) 88 MCG tablet Take 1 tablet (88 mcg total) by mouth daily. 04/28/23  Yes [name], MD  metFORMIN (GLUCOPHAGE-XR) 750 MG 24 hr tablet 1 tab with first meal and 1 tab with dinner 07/07/23  Yes Danford, Orpha Bur D, NP  Vitamin D, Ergocalciferol, (DRISDOL) 1.25 MG (50000 UNIT) CAPS capsule Take 1 capsule (50,000  Units total) by mouth every 7 (seven) days. 06/07/23  Yes Danford, Orpha Bur D, NP  meloxicam (MOBIC) 7.5 MG tablet Take 1 tablet (7.5 mg total) by mouth daily as needed for pain. 05/11/23   Cristie Hem, PA-C    Family History Family History  Problem Relation Age of Onset   Thyroid disease Mother    Arthritis Mother    Asthma Mother    Migraines Mother    High blood pressure Mother    Sleep apnea Mother    Obesity Mother    Heart failure Father    Hyperlipidemia Father    Heart disease Father 34       AMI age 37; CABG at 67.   Arthritis Father    High Cholesterol Father    AAA (abdominal aortic aneurysm) Father     Social History Social History   Tobacco Use   Smoking status: Never   Smokeless tobacco: Never  Vaping Use   Vaping status: Never Used  Substance Use Topics   Alcohol use: Yes    Comment: occ   Drug use: No     Allergies   Saxenda [liraglutide -weight management], Sulfa antibiotics, and Lipitor [atorvastatin]   Review of  Systems Review of Systems  Constitutional:  Negative for chills and fever.  Eyes:  Negative for discharge and redness.  Gastrointestinal:  Negative for abdominal pain, nausea and vomiting.  Musculoskeletal:  Negative for arthralgias.  Skin:  Positive for wound. Negative for color change.  Neurological:  Negative for numbness.     Physical Exam Triage Vital Signs ED Triage Vitals  Encounter Vitals Group     BP 07/20/23 1255 137/83     Systolic BP Percentile --      Diastolic BP Percentile --      Pulse Rate 07/20/23 1255 (!) 58     Resp 07/20/23 1255 16     Temp 07/20/23 1255 98 F (36.7 C)     Temp Source 07/20/23 1255 Oral     SpO2 07/20/23 1255 96 %     Weight --      Height --      Head Circumference --      Peak Flow --      Pain Score 07/20/23 1254 0     Pain Loc --      Pain Education --      Exclude from Growth Chart --    No data found.  Updated Vital Signs BP 137/83 (BP Location: Right Arm)   Pulse (!) 58   Temp 98 F (36.7 C) (Oral)   Resp 16   SpO2 96%    Physical Exam Vitals and nursing note reviewed.  Constitutional:      General: She is not in acute distress.    Appearance: Normal appearance. She is not ill-appearing.  HENT:     Head: Normocephalic and atraumatic.  Eyes:     Conjunctiva/sclera: Conjunctivae normal.  Cardiovascular:     Rate and Rhythm: Normal rate.  Pulmonary:     Effort: Pulmonary effort is normal. No respiratory distress.  Skin:    Comments: Approximately 4 cm laceration to left heel at Achilles area, no active bleeding on exam  Neurological:     Mental Status: She is alert.  Psychiatric:        Mood and Affect: Mood normal.        Behavior: Behavior normal.        Thought Content: Thought content normal.  UC Treatments / Results  Labs (all labs ordered are listed, but only abnormal results are displayed) Labs Reviewed - No data to display  EKG   Radiology No results found.  Procedures Procedures  (including critical care time)  Medications Ordered in UC Medications - No data to display  Initial Impression / Assessment and Plan / UC Course  I have reviewed the triage vital signs and the nursing notes.  Pertinent labs & imaging results that were available during my care of the patient were reviewed by me and considered in my medical decision making (see chart for details).    Recommended pressure dressing, and continued monitoring for any signs of infection.  Closure deferred given delay in care.  Encouraged follow-up with any concerns.  Final Clinical Impressions(s) / UC Diagnoses   Final diagnoses:  Laceration of left heel, initial encounter   Discharge Instructions   None    ED Prescriptions   None    PDMP not reviewed this encounter.   Tomi Bamberger, PA-C 07/20/23 1321

## 2023-07-20 NOTE — ED Triage Notes (Signed)
Pt states she hit her left heel on the screen door yesterday and the area hasn't stopped bleeding. She washed the area and covered it.

## 2023-07-22 ENCOUNTER — Encounter (INDEPENDENT_AMBULATORY_CARE_PROVIDER_SITE_OTHER): Payer: Self-pay

## 2023-08-09 ENCOUNTER — Encounter (INDEPENDENT_AMBULATORY_CARE_PROVIDER_SITE_OTHER): Payer: Self-pay | Admitting: Adult Health

## 2023-08-09 ENCOUNTER — Ambulatory Visit (INDEPENDENT_AMBULATORY_CARE_PROVIDER_SITE_OTHER): Payer: BC Managed Care – PPO | Admitting: Adult Health

## 2023-08-09 VITALS — BP 135/83 | HR 71 | Temp 98.1°F | Ht 60.0 in | Wt 169.0 lb

## 2023-08-09 DIAGNOSIS — E66812 Obesity, class 2: Secondary | ICD-10-CM

## 2023-08-09 DIAGNOSIS — Z6833 Body mass index (BMI) 33.0-33.9, adult: Secondary | ICD-10-CM | POA: Diagnosis not present

## 2023-08-09 DIAGNOSIS — I10 Essential (primary) hypertension: Secondary | ICD-10-CM

## 2023-08-09 DIAGNOSIS — R7303 Prediabetes: Secondary | ICD-10-CM

## 2023-08-09 DIAGNOSIS — E669 Obesity, unspecified: Secondary | ICD-10-CM

## 2023-08-09 DIAGNOSIS — E66811 Obesity, class 1: Secondary | ICD-10-CM

## 2023-08-09 DIAGNOSIS — Z7984 Long term (current) use of oral hypoglycemic drugs: Secondary | ICD-10-CM

## 2023-08-09 NOTE — Progress Notes (Signed)
WEIGHT SUMMARY AND BIOMETRICS  Vitals Temp: 98.1 F (36.7 C) BP: 135/83 Pulse Rate: 71 SpO2: 97 %   Anthropometric Measurements Height: 5' (1.524 m) Weight: 169 lb (76.7 kg) BMI (Calculated): 33.01 Weight at Last Visit: 168lb Weight Lost Since Last Visit: 0 Weight Gained Since Last Visit: 1lb Starting Weight: 177lb Total Weight Loss (lbs): 10 lb (4.536 kg)   Body Composition  Body Fat %: 39.4 % Fat Mass (lbs): 67 lbs Muscle Mass (lbs): 97.6 lbs Total Body Water (lbs): 68.8 lbs Visceral Fat Rating : 11   Other Clinical Data Fasting: yes Labs: yes Today's Visit #: 19 Starting Date: 04/03/20    Chief Complaint:   OBESITY [name] is here to discuss her progress with her obesity treatment plan. She is on the keeping a food journal and adhering to recommended goals of 1200 calories and 90+ protein and states she is following her eating plan approximately 75 % of the time. She states she is not currently exercising- re: laceration to her left heel    Interim History:  Since last OV at [facility] on 07/07/2023 07/19/2023 suffered laceration to her left heel  Week long trip to [facility], returned home 08/07/2023  She continues to avoid consuming food additives   Of note- unable to tolerate GLP-1 therapy, Re: GI upset and elevated Lipase levels  Subjective:   1. Essential hypertension BP stable at OV PCP manages daily Vasotec 50mg   She denies CP with exertion  2. Pre-diabetes Lab Results  Component Value Date   HGBA1C 5.9 (H) 05/04/2023   HGBA1C 5.6 12/16/2022   HGBA1C 5.6 09/09/2022    She is currently on Metformin XR 750mg - BID with meals  Assessment/Plan:   1. Essential hypertension Limit Na+ intake and resume regular exercise when hell laceration has healed  2. Pre-diabetes Continue Metformin XR 750mg - BID with meals  3. Obesity with current BMI 33.01  Judye is currently in the action stage of change. As such, her goal is to continue with  weight loss efforts. She has agreed to keeping a food journal and adhering to recommended goals of 1200 calories and 90 protein.   Exercise goals: No exercise has been prescribed at this time.  Behavioral modification strategies: increasing lean protein intake, decreasing simple carbohydrates, increasing vegetables, increasing water intake, no skipping meals, meal planning and cooking strategies, and planning for success.  Akiva has agreed to follow-up with our clinic in 4 weeks. She was informed of the importance of frequent follow-up visits to maximize her success with intensive lifestyle modifications for her multiple health conditions.   Objective:   Blood pressure 135/83, pulse 71, temperature 98.1 F (36.7 C), height 5' (1.524 m), weight 169 lb (76.7 kg), SpO2 97%. Body mass index is 33.01 kg/m.  General: Cooperative, alert, well developed, in no acute distress. HEENT: Conjunctivae and lids unremarkable. Cardiovascular: Regular rhythm.  Lungs: Normal work of breathing. Neurologic: No focal deficits.   Lab Results  Component Value Date   CREATININE 0.80 04/28/2023   BUN 16 04/28/2023   NA 139 04/28/2023   K 4.1 04/28/2023   CL 103 04/28/2023   CO2 28 04/28/2023   Lab Results  Component Value Date   ALT 18 12/16/2022   AST 15 12/16/2022   ALKPHOS 62 12/16/2022   BILITOT 0.5 12/16/2022   Lab Results  Component Value Date   HGBA1C 5.9 (H) 05/04/2023   HGBA1C 5.6 12/16/2022   HGBA1C 5.6 09/09/2022   HGBA1C 5.6 04/20/2022  HGBA1C 5.7 (H) 01/08/2022   Lab Results  Component Value Date   INSULIN 8.8 05/04/2023   INSULIN 6.5 12/16/2022   INSULIN 6.5 09/09/2022   INSULIN 7.9 04/20/2022   INSULIN 7.7 01/08/2022   Lab Results  Component Value Date   TSH 4.09 04/28/2023   Lab Results  Component Value Date   CHOL 259 (H) 09/09/2022   HDL 62 09/09/2022   LDLCALC 145 (H) 09/09/2022   TRIG 287 (H) 09/09/2022   CHOLHDL 4.2 09/09/2022   Lab Results  Component  Value Date   VD25OH 40.7 05/04/2023   VD25OH 37.9 12/16/2022   VD25OH 32.7 09/09/2022   Lab Results  Component Value Date   WBC 6.6 09/09/2022   HGB 15.0 09/09/2022   HCT 43.8 09/09/2022   MCV 97 09/09/2022   PLT 354 09/09/2022   No results found for: "IRON", "TIBC", "FERRITIN"  Attestation Statements:   Reviewed by clinician on day of visit: allergies, medications, problem list, medical history, surgical history, family history, social history, and previous encounter notes.  Time spent on visit including pre-visit chart review and post-visit care and charting was 28 minutes.   I have reviewed the above documentation for accuracy and completeness, and I agree with the above. -  Quintessa Simmerman d. Nadean Montanaro, NP-C

## 2023-08-22 IMAGING — CT CT HEAD W/O CM
3 of 4 series · 15 of 47 positions shown, 18 images · non-contrast
Comparison: None.

CLINICAL DATA: Headache, new or worsening (Age >= 50y) 2 episodes
thunderclap HA.



[Series 3: head 2.0 hr68 · axial · 0.39mm/px · z∈[+176,+282]mm · 9 of 67 slices shown, 12 images]
[im 7/67  brain]
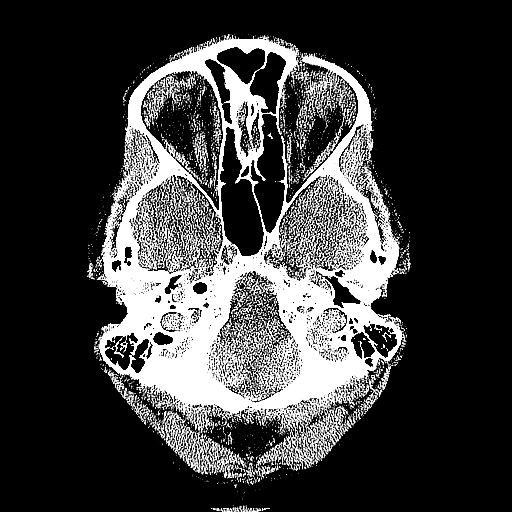
[im 7/67  bone]
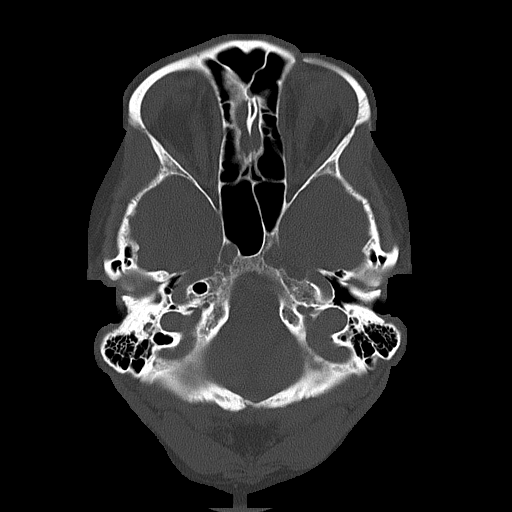
[im 14/67  brain]
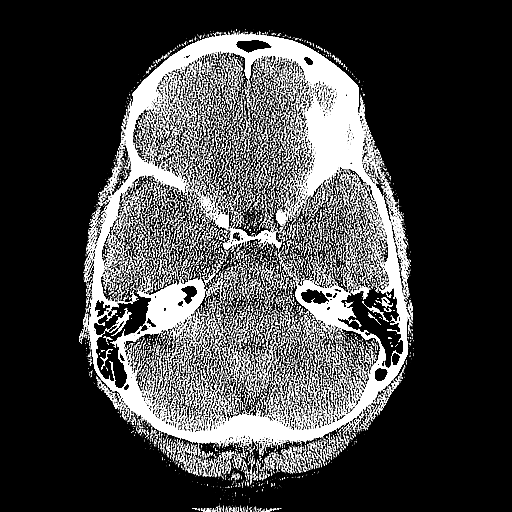
[im 20/67  brain]
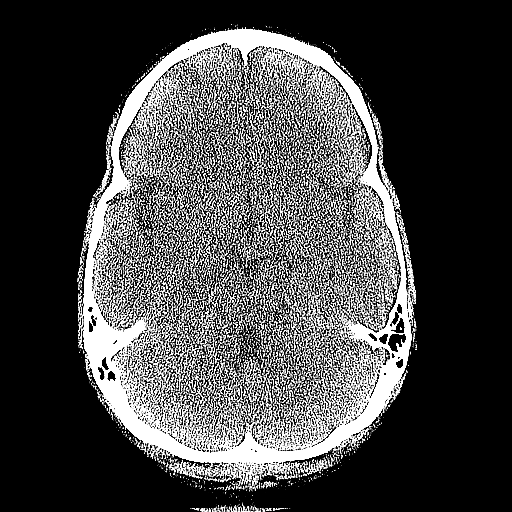
[im 27/67  brain]
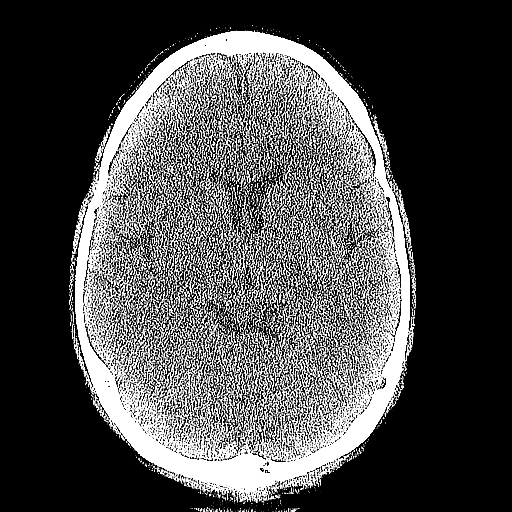
[im 34/67  brain]
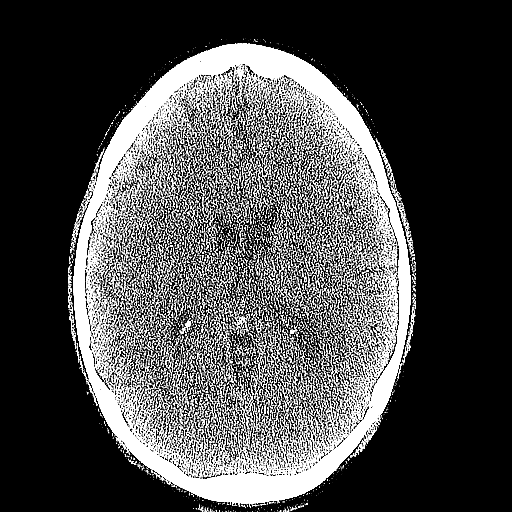
[im 34/67  bone]
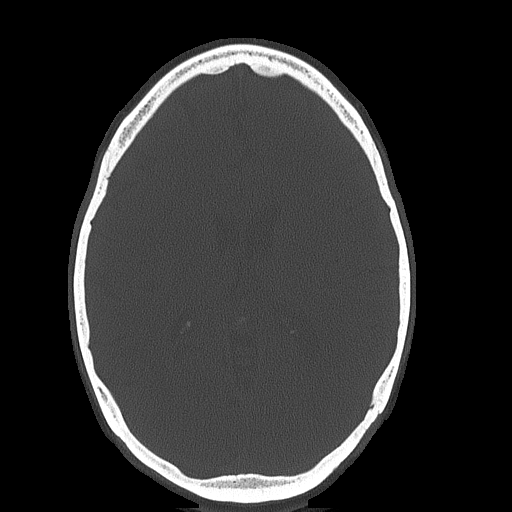
[im 40/67  brain]
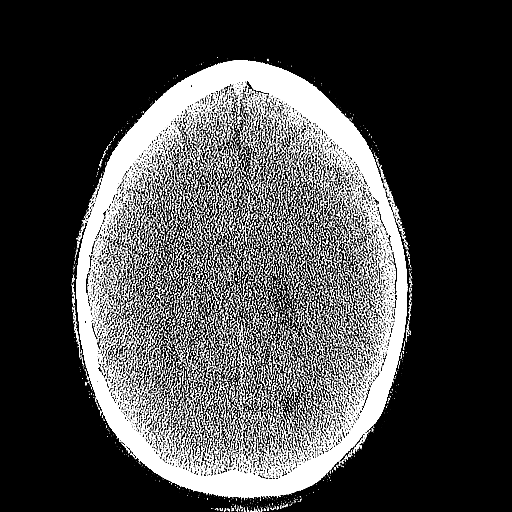
[im 47/67  brain]
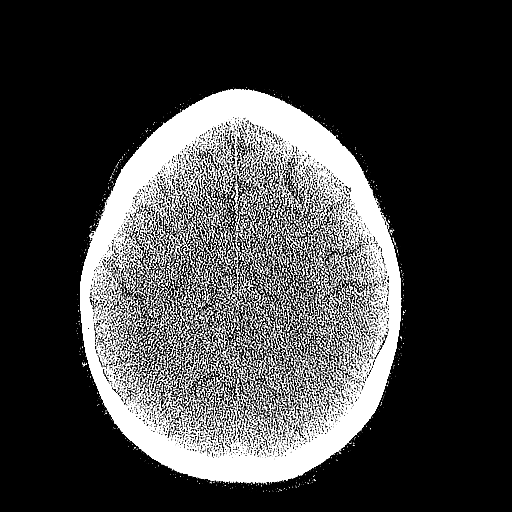
[im 53/67  brain]
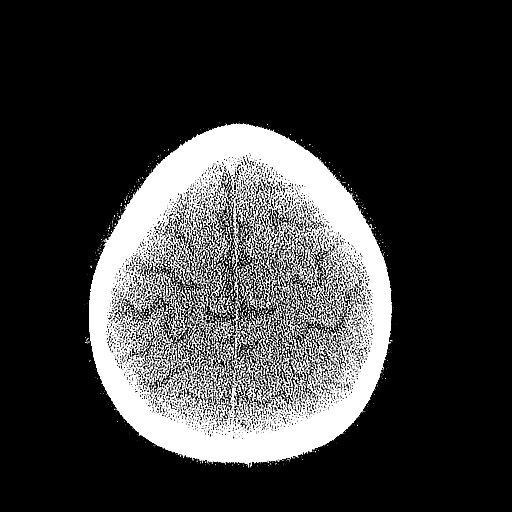
[im 60/67  brain]
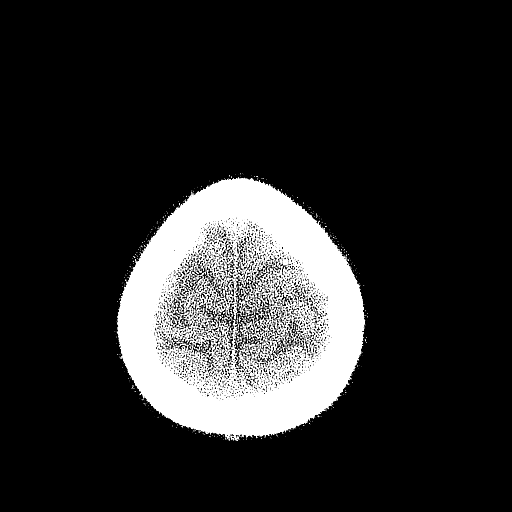
[im 60/67  bone]
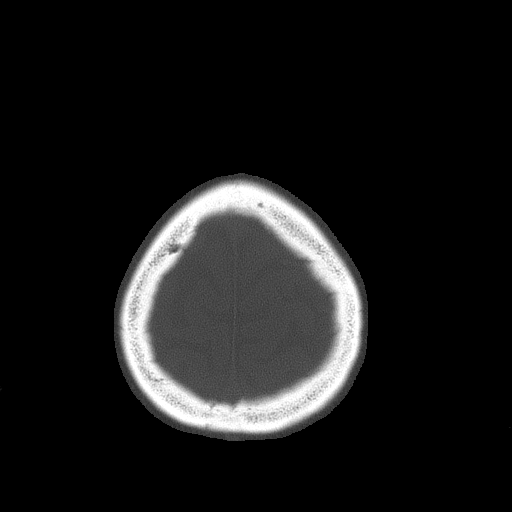

[Series 4: head 3.0 mpr cor · coronal · 0.28mm/px · 3 of 64 slices shown]
[im 22/64  brain]
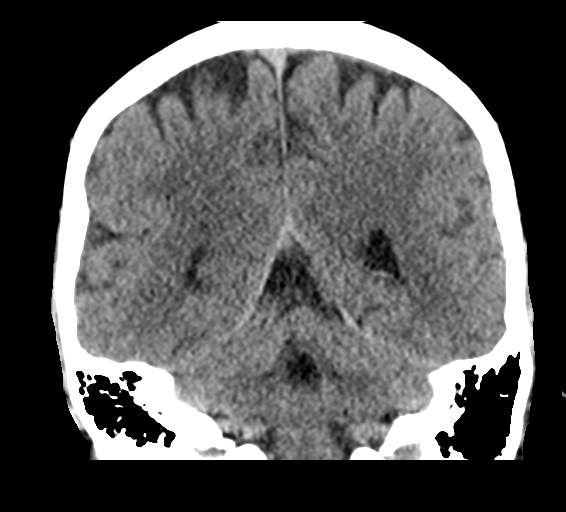
[im 29/64  brain]
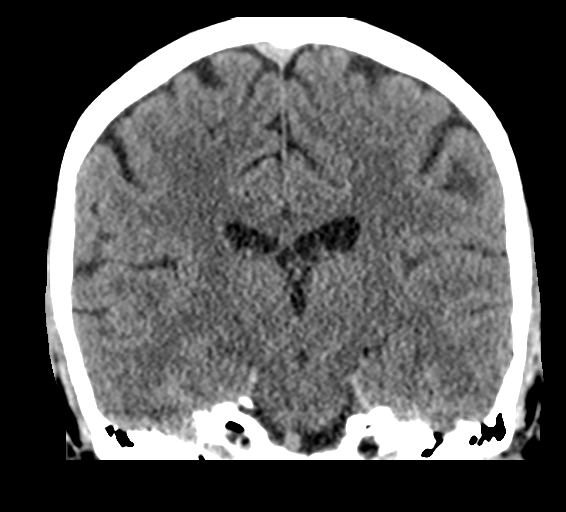
[im 36/64  brain]
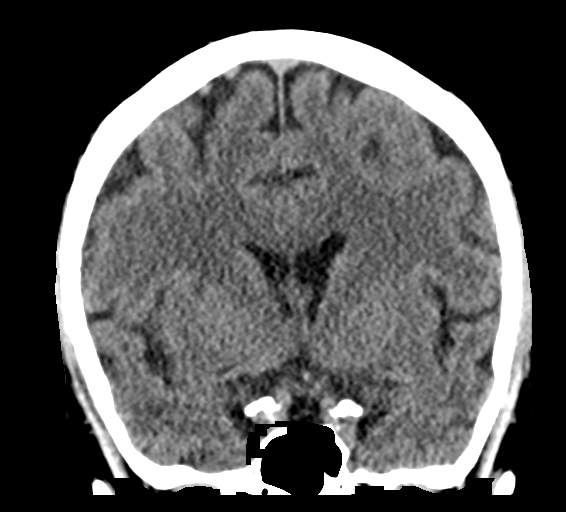

[Series 5: head 3.0 mpr sag · sagittal · 0.27mm/px · 3 of 50 slices shown]
[im 17/50  brain]
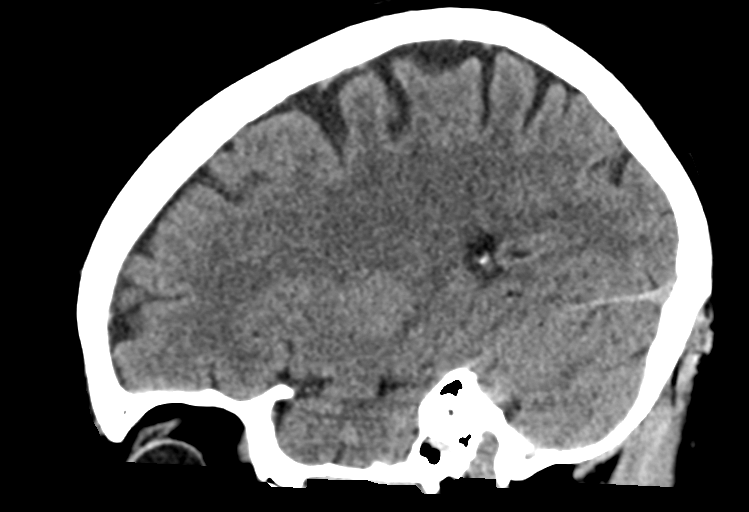
[im 25/50  brain]
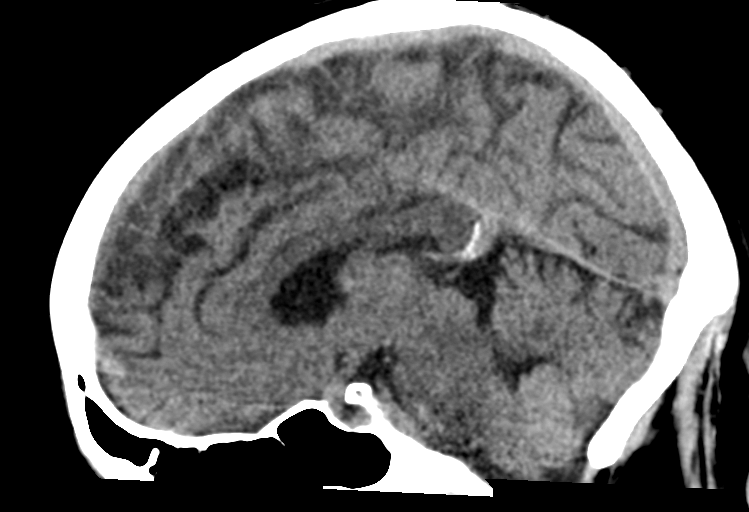
[im 33/50  brain]
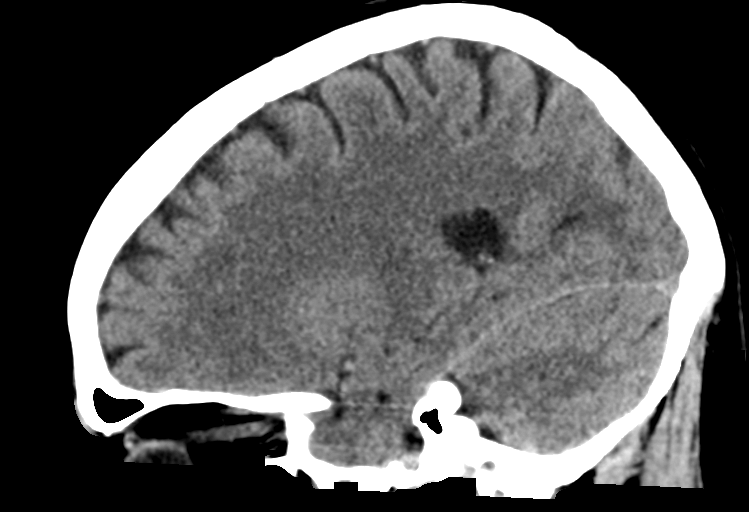

[15 of 47 positions shown; findings below may reference images not displayed]

FINDINGS: Brain: No evidence of acute intracranial hemorrhage or extra-axial
collection.No evidence of mass lesion/concerning mass effect.The
ventricles are normal in size.

Vascular: No hyperdense vessel or unexpected calcification.

Skull: Normal. Negative for fracture or focal lesion.

Sinuses/Orbits: Mild paranasal sinus mucosal thickening. Orbits are
unremarkable.

Other: None.
IMPRESSION: No acute intracranial abnormality.

## 2023-08-22 IMAGING — CT CT CARDIAC CORONARY ARTERY CALCIUM SCORE
3 series · 14 of 20 positions shown, 16 images · non-contrast
Comparison: None.
COMPARISON: None.

Addendum:
CLINICAL DATA: This over-read does not include interpretation of
cardiac or coronary anatomy or pathology. The coronary calcium score
extracardiac interpretation by the cardiologist is attached.
CLINICAL DATA: Cardiovascular Disease Risk stratification

EXAM:
Coronary Calcium Score
TECHNIQUE: A gated, non-contrast computed tomography scan of the heart was
performed using 3mm slice thickness. Axial images were analyzed on a
dedicated workstation. Calcium scoring of the coronary arteries was
performed using the Agatston method.

[Series 2: cascseq 2.0 sa36 70% (id) · axial · 0.39mm/px · z∈[-5,+75]mm · 4 of 68 slices shown]
[im 14/68  vessel]
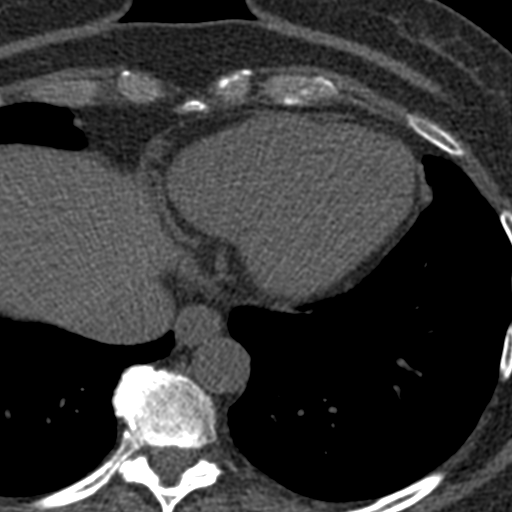
[im 27/68  vessel]
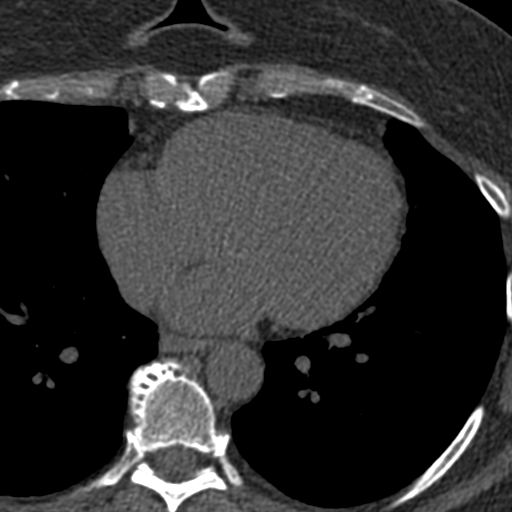
[im 41/68  vessel]
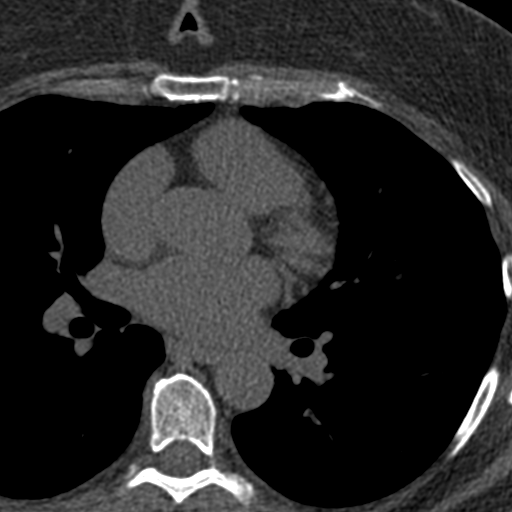
[im 54/68  vessel]
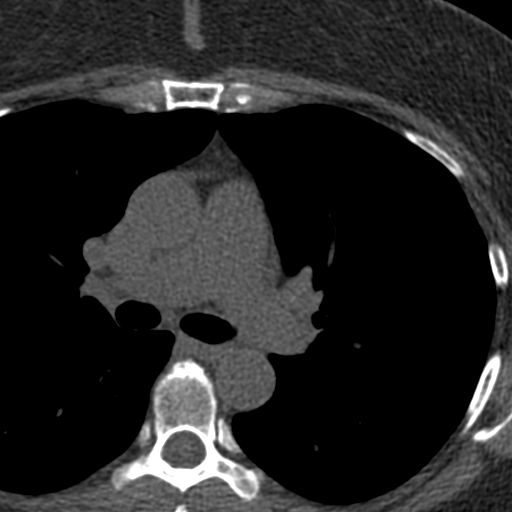

[Series 3: cascseq 2.0 bf37 st · axial · 0.66mm/px · z∈[-9,+79]mm · 5 of 68 slices shown, 7 images]
[im 12/68  vessel]
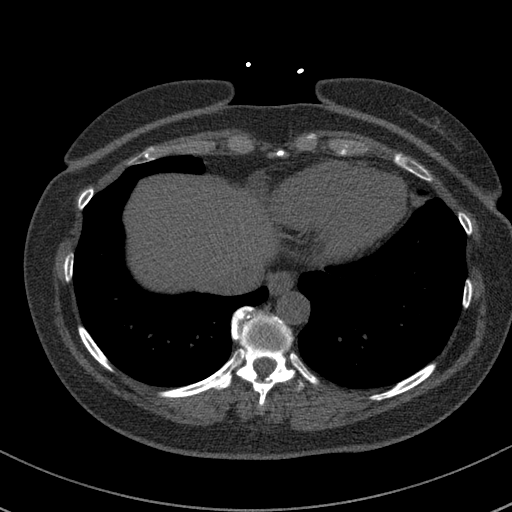
[im 12/68  lung]
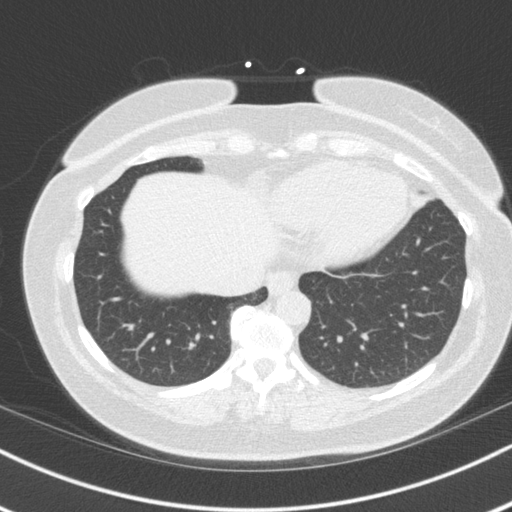
[im 23/68  vessel]
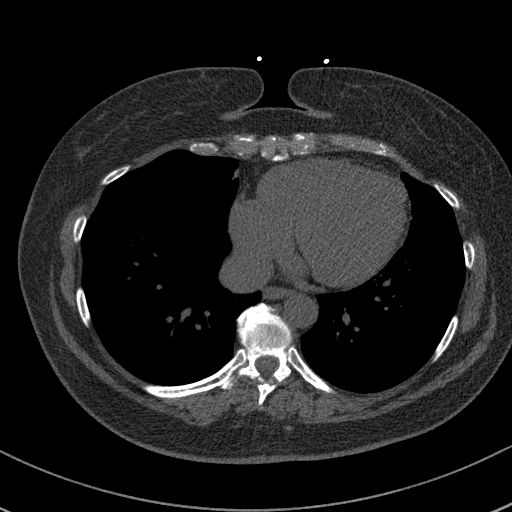
[im 34/68  vessel]
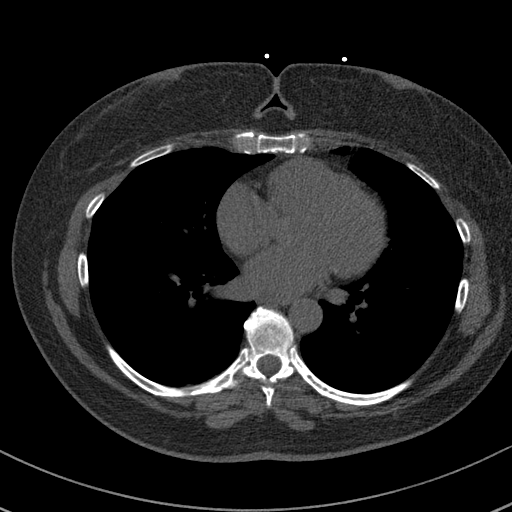
[im 45/68  vessel]
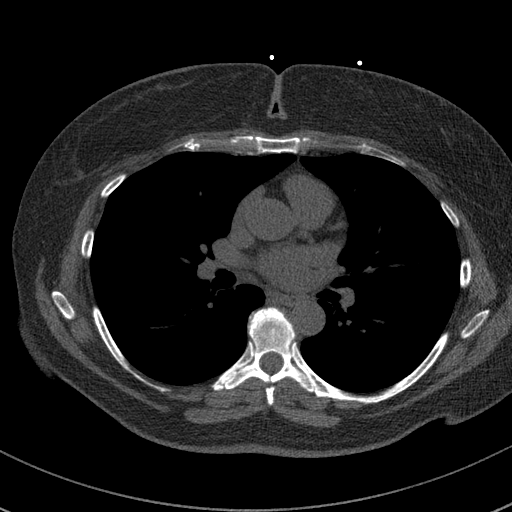
[im 56/68  vessel]
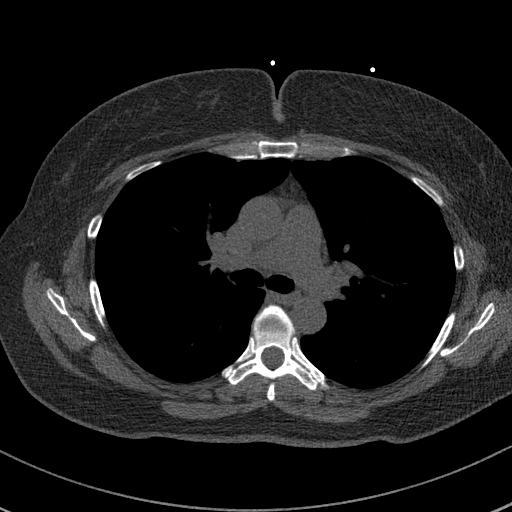
[im 56/68  lung]
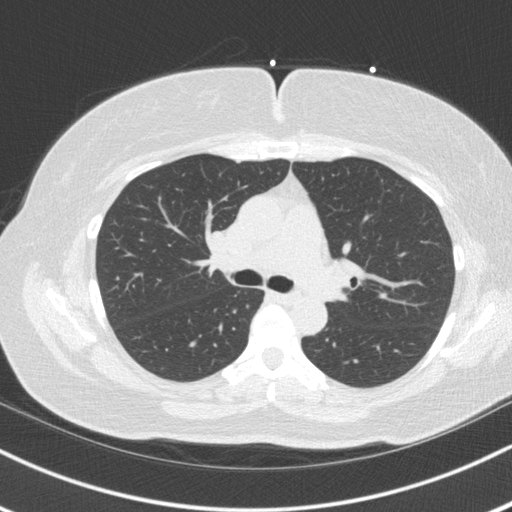

[Series 4: cascseq 2.0 br59 lung · axial · 0.66mm/px · z∈[-9,+79]mm · 5 of 68 slices shown]
[im 12/68  lung]
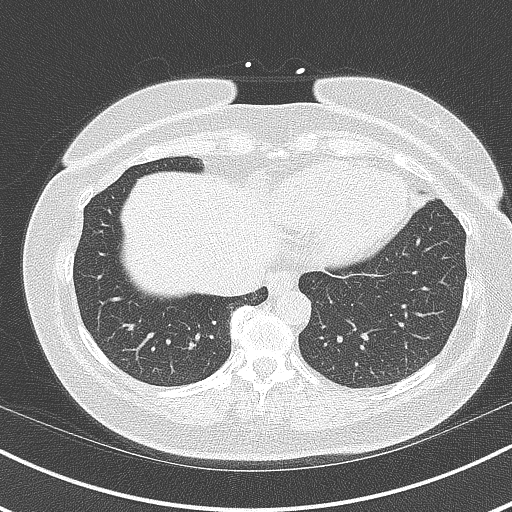
[im 23/68  lung]
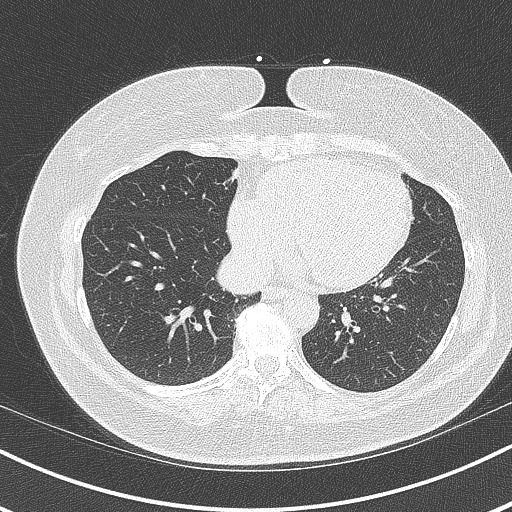
[im 34/68  lung]
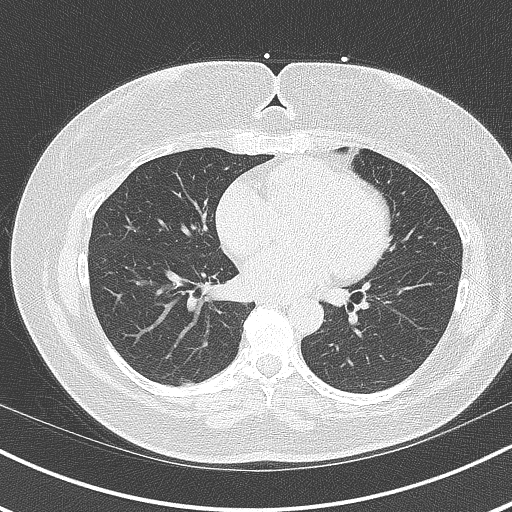
[im 45/68  lung]
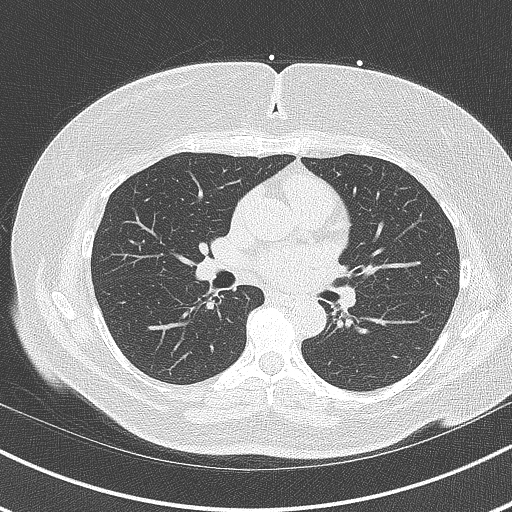
[im 56/68  lung]
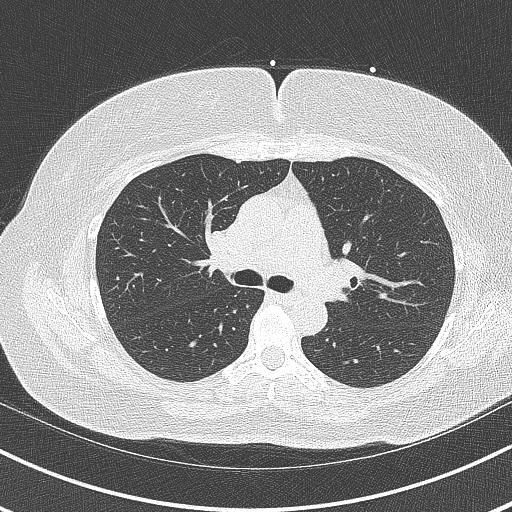

[14 of 20 positions shown; findings below may reference images not displayed]

FINDINGS: Vascular: No significant vascular findings.

Mediastinum/Nodes: No lymphadenopathy within the field of view.

Lungs/Pleura: No suspicious pulmonary nodule. No focal airspace
disease.

Upper Abdomen: No acute abnormality.

Musculoskeletal: No chest wall mass or suspicious bone lesions
identified within the field of view.
IMPRESSION: No acute or significant incidental extracardiac findings in the
chest. Interpretation of cardiac/coronary anatomy by cardiology to
follow.
FINDINGS: Coronary arteries: Normal origins.

Coronary Calcium Score:

Left main: 0

Left anterior descending artery: 0

Left circumflex artery: 0

Right coronary artery: 0

Total: 0

Pericardium: Normal.

Ascending Aorta: Normal caliber.

Non-cardiac: See separate report from [REDACTED].
IMPRESSION: Coronary calcium score of 0 Agatston units. This suggests low risk
for future cardiac events.



If CAC=0, it is reasonable to withhold statin therapy and reassess
in 5 to 10 years, as long as higher risk conditions are absent
(diabetes mellitus, family history of premature CHD in first degree
relatives (males <55 years; females <65 years), cigarette smoking,
or LDL >=190 mg/dL).

If CAC is 1 to 99, it is reasonable to initiate statin therapy for
patients >=55 years of age.

If CAC is >=100 or >=75th percentile, it is reasonable to initiate
statin therapy at any age.

Cardiology referral should be considered for patients with CAC
scores >=400 or >=75th percentile.

*7421 AHA/ACC/AACVPR/AAPA/ABC/GEMY/RAJESWARI/WANAKA/Jug/JHON JAY/TOMCZAK/BIAGIONI
Guideline on the Management of Blood Cholesterol: A Report of the
American College of Cardiology/American Heart Association Task Force
on Clinical Practice Guidelines. J Am Coll Cardiol.
8981;73(24):7732-7976.

*** End of Addendum ***
FINDINGS: Vascular: No significant vascular findings.

Mediastinum/Nodes: No lymphadenopathy within the field of view.

Lungs/Pleura: No suspicious pulmonary nodule. No focal airspace
disease.

Upper Abdomen: No acute abnormality.

Musculoskeletal: No chest wall mass or suspicious bone lesions
identified within the field of view.
IMPRESSION: No acute or significant incidental extracardiac findings in the
chest. Interpretation of cardiac/coronary anatomy by cardiology to
follow.

## 2023-09-06 ENCOUNTER — Ambulatory Visit (INDEPENDENT_AMBULATORY_CARE_PROVIDER_SITE_OTHER): Payer: BC Managed Care – PPO | Admitting: Adult Health

## 2023-09-06 ENCOUNTER — Encounter (INDEPENDENT_AMBULATORY_CARE_PROVIDER_SITE_OTHER): Payer: Self-pay | Admitting: Adult Health

## 2023-09-06 VITALS — BP 149/83 | HR 70 | Temp 98.1°F | Ht 60.0 in | Wt 172.0 lb

## 2023-09-06 DIAGNOSIS — E669 Obesity, unspecified: Secondary | ICD-10-CM

## 2023-09-06 DIAGNOSIS — E66812 Obesity, class 2: Secondary | ICD-10-CM

## 2023-09-06 DIAGNOSIS — Z6833 Body mass index (BMI) 33.0-33.9, adult: Secondary | ICD-10-CM

## 2023-09-06 DIAGNOSIS — I10 Essential (primary) hypertension: Secondary | ICD-10-CM

## 2023-09-06 DIAGNOSIS — R7303 Prediabetes: Secondary | ICD-10-CM | POA: Diagnosis not present

## 2023-09-06 DIAGNOSIS — G8929 Other chronic pain: Secondary | ICD-10-CM

## 2023-09-06 DIAGNOSIS — E66811 Obesity, class 1: Secondary | ICD-10-CM

## 2023-09-06 DIAGNOSIS — M5441 Lumbago with sciatica, right side: Secondary | ICD-10-CM

## 2023-09-06 MED ORDER — METFORMIN HCL ER 750 MG PO TB24
ORAL_TABLET | ORAL | 0 refills | Status: DC
Start: 2023-09-06 — End: 2023-10-04

## 2023-09-06 NOTE — Progress Notes (Signed)
WEIGHT SUMMARY AND BIOMETRICS  Vitals Temp: 98.1 F (36.7 C) BP: (!) 149/83 Pulse Rate: 70 SpO2: 95 %   Anthropometric Measurements Height: 5' (1.524 m) Weight: 172 lb (78 kg) BMI (Calculated): 33.59 Weight at Last Visit: 169lb Weight Lost Since Last Visit: 0 Weight Gained Since Last Visit: 3lb Starting Weight: 177lb Total Weight Loss (lbs): 7 lb (3.175 kg)   Body Composition  Body Fat %: 40.4 % Fat Mass (lbs): 69.6 lbs Muscle Mass (lbs): 97.6 lbs Total Body Water (lbs): 69.2 lbs Visceral Fat Rating : 12   Other Clinical Data Fasting: no Labs: no Today's Visit #: 20 Starting Date: 04/03/20    Chief Complaint:   OBESITY [name] is here to discuss her progress with her obesity treatment plan. She is on the keeping a food journal and adhering to recommended goals of 1200 calories and 90+ protein and states she is following her eating plan approximately 50 % of the time. She states she is exercising Walking and Swimming 30 minutes 5 times per week.   Interim History:  Despite chronic pain, she has been able to resume regular exercise in the last 2 weeks.  She reports acute illness at beginning of Oct- she experienced nasal congestion, fatigue, body aches, and N/V She denies fever or dyspnea  She did not test for COVID-19, however did sequester herself at home during active sx's  Subjective:   1. Essential hypertension BP elevated at OV Due to chronic pain, she has been using PRN Meloxicam 7.5mg  BID for the past several weeks. She denies CP with exertion  2. Pre-diabetes Lab Results  Component Value Date   HGBA1C 5.9 (H) 05/04/2023   HGBA1C 5.6 12/16/2022   HGBA1C 5.6 09/09/2022    [name] was unable to tolerate GLP-1 therapy, Re: GI upset and elevated Lipase levels  She is currently on Metformin XR 750mg  BID with meals and denies any current GI upset  3. Chronic bilateral low back pain with right-sided sciatica Treated with rest and PRN  Meloxicam 7.5mg  BID  She has been able to recently increase regular exercise, ie: walking and swimming  Assessment/Plan:   1. Essential hypertension Try to reduce daily NSAID Check home readings Continue daily Vasotec 20mg   2. Pre-diabetes Refill - metFORMIN (GLUCOPHAGE-XR) 750 MG 24 hr tablet; 1 tab with first meal and 1 tab with dinner  Dispense: 60 tablet; Refill: 0  3. Chronic bilateral low back pain with right-sided sciatica  4. Obesity with current BMI 33.59  [name] is currently in the action stage of change. As such, her goal is to continue with weight loss efforts. She has agreed to keeping a food journal and adhering to recommended goals of 1200 calories and 90+ protein.   Exercise goals: For substantial health benefits, adults should do at least 150 minutes (2 hours and 30 minutes) a week of moderate-intensity, or 75 minutes (1 hour and 15 minutes) a week of vigorous-intensity aerobic physical activity, or an equivalent combination of moderate- and vigorous-intensity aerobic activity. Aerobic activity should be performed in episodes of at least 10 minutes, and preferably, it should be spread throughout the week.  Behavioral modification strategies: increasing lean protein intake, decreasing simple carbohydrates, increasing vegetables, increasing water intake, no skipping meals, meal planning and cooking strategies, keeping healthy foods in the home, and planning for success.  [name] has agreed to follow-up with our clinic in 4 weeks. She was informed of the importance of frequent follow-up visits to maximize her  success with intensive lifestyle modifications for her multiple health conditions.   Objective:   Blood pressure (!) 149/83, pulse 70, temperature 98.1 F (36.7 C), height 5' (1.524 m), weight 172 lb (78 kg), SpO2 95%. Body mass index is 33.59 kg/m.  General: Cooperative, alert, well developed, in no acute distress. HEENT: Conjunctivae and lids  unremarkable. Cardiovascular: Regular rhythm.  Lungs: Normal work of breathing. Neurologic: No focal deficits.   Lab Results  Component Value Date   CREATININE 0.80 04/28/2023   BUN 16 04/28/2023   NA 139 04/28/2023   K 4.1 04/28/2023   CL 103 04/28/2023   CO2 28 04/28/2023   Lab Results  Component Value Date   ALT 18 12/16/2022   AST 15 12/16/2022   ALKPHOS 62 12/16/2022   BILITOT 0.5 12/16/2022   Lab Results  Component Value Date   HGBA1C 5.9 (H) 05/04/2023   HGBA1C 5.6 12/16/2022   HGBA1C 5.6 09/09/2022   HGBA1C 5.6 04/20/2022   HGBA1C 5.7 (H) 01/08/2022   Lab Results  Component Value Date   INSULIN 8.8 05/04/2023   INSULIN 6.5 12/16/2022   INSULIN 6.5 09/09/2022   INSULIN 7.9 04/20/2022   INSULIN 7.7 01/08/2022   Lab Results  Component Value Date   TSH 4.09 04/28/2023   Lab Results  Component Value Date   CHOL 259 (H) 09/09/2022   HDL 62 09/09/2022   LDLCALC 145 (H) 09/09/2022   TRIG 287 (H) 09/09/2022   CHOLHDL 4.2 09/09/2022   Lab Results  Component Value Date   VD25OH 40.7 05/04/2023   VD25OH 37.9 12/16/2022   VD25OH 32.7 09/09/2022   Lab Results  Component Value Date   WBC 6.6 09/09/2022   HGB 15.0 09/09/2022   HCT 43.8 09/09/2022   MCV 97 09/09/2022   PLT 354 09/09/2022   No results found for: "IRON", "TIBC", "FERRITIN"  Attestation Statements:   Reviewed by clinician on day of visit: allergies, medications, problem list, medical history, surgical history, family history, social history, and previous encounter notes.  I have reviewed the above documentation for accuracy and completeness, and I agree with the above. -  [name], NP-C

## 2023-09-24 ENCOUNTER — Ambulatory Visit
Admission: RE | Admit: 2023-09-24 | Discharge: 2023-09-24 | Disposition: A | Discharge status: Home / Self Care | Payer: BC Managed Care – PPO | Source: Ambulatory Visit | Attending: Internal Medicine | Admitting: Internal Medicine

## 2023-09-24 VITALS — BP 155/86 | HR 95 | Temp 99.0°F | Resp 18

## 2023-09-24 DIAGNOSIS — H60392 Other infective otitis externa, left ear: Secondary | ICD-10-CM

## 2023-09-24 MED ORDER — CIPROFLOXACIN-DEXAMETHASONE 0.3-0.1 % OT SUSP: 4.0000 [drp] | Freq: Two times a day (BID) | OTIC | 0 refills | Status: DC

## 2023-09-24 NOTE — ED Triage Notes (Signed)
Pt reports left ear drainage, burning, discomfort, itching on and off  x 1 month.

## 2023-09-24 NOTE — ED Provider Notes (Signed)
[facility]  Note:  This document was prepared using Conservation officer, historic buildings and may include unintentional dictation errors.  MRN: [id number] DOB: October 05, 1961  Subjective:   [name] is a 62 y.o. female presenting for 1 month history of persistent intermittent worsening left ear drainage, burning, itching and swelling.  Patient has longstanding history of recurrent external ear infections.  Would like coverage for this.  No trauma, fever, bleeding.  Has not seen an ENT for this.  No current facility-administered medications for this encounter.  Current Outpatient Medications:    enalapril (VASOTEC) 20 MG tablet, Take 1 tablet (20 mg total) by mouth daily., Disp: 90 tablet, Rfl: 3   levothyroxine (SYNTHROID) 88 MCG tablet, Take 1 tablet (88 mcg total) by mouth daily., Disp: 90 tablet, Rfl: 3   meloxicam (MOBIC) 7.5 MG tablet, Take 1 tablet (7.5 mg total) by mouth daily as needed for pain., Disp: 60 tablet, Rfl: 2   metFORMIN (GLUCOPHAGE-XR) 750 MG 24 hr tablet, 1 tab with first meal and 1 tab with dinner, Disp: 60 tablet, Rfl: 0   Vitamin D, Ergocalciferol, (DRISDOL) 1.25 MG (50000 UNIT) CAPS capsule, Take 1 capsule (50,000 Units total) by mouth every 7 (seven) days., Disp: 8 capsule, Rfl: 0   Allergies  Allergen Reactions   Saxenda [Liraglutide -Weight Management] Nausea And Vomiting    With borderline high lipase - concern for possible pancreatitis.    Sulfa Antibiotics Hives   Lipitor [Atorvastatin] Other (See Comments)    Fatigue and confusion    Past Medical History:  Diagnosis Date   Allergy    dust mite allergies; Zyrtec.   Anemia    Back pain    Blood transfusion without reported diagnosis    post-operatively in 20s after L1 fracture with spinal cord injury after horseback riding.   Chronic kidney disease    Tuberous Sclerosis; [facility].   Edema of both lower extremities    Hyperlipidemia    Hypertension    Hypothyroidism     Lower back pain    Pre-diabetes    Thyroid disease    Vitamin D deficiency      Past Surgical History:  Procedure Laterality Date   ABDOMINAL HYSTERECTOMY     DUB; ovaries remaining.   CESAREAN SECTION     MENISCUS REPAIR     Sleep Study  12/17/2010   No sleep disordered breathing or periodic limb mvmets.  Excess light sleep.   SPINE SURGERY     L1 fracture with spinal cord injury with horseback riding.   TOTAL VAGINAL HYSTERECTOMY  2008   TUBAL LIGATION      Family History  Problem Relation Age of Onset   Thyroid disease Mother    Arthritis Mother    Asthma Mother    Migraines Mother    High blood pressure Mother    Sleep apnea Mother    Obesity Mother    Heart failure Father    Hyperlipidemia Father    Heart disease Father 66       AMI age 30; CABG at 56.   Arthritis Father    High Cholesterol Father    AAA (abdominal aortic aneurysm) Father     Social History   Tobacco Use   Smoking status: Never   Smokeless tobacco: Never  Vaping Use   Vaping status: Never Used  Substance Use Topics   Alcohol use: Yes    Comment: occ   Drug use: No  ROS   Objective:   Vitals: BP (!) 155/86 (BP Location: Right Arm)   Pulse 95   Temp 99 F (37.2 C) (Oral)   Resp 18   SpO2 96%   Physical Exam Constitutional:      General: She is not in acute distress.    Appearance: Normal appearance. She is well-developed. She is not ill-appearing, toxic-appearing or diaphoretic.  HENT:     Head: Normocephalic and atraumatic.     Right Ear: Tympanic membrane, ear canal and external ear normal. No tenderness. There is no impacted cerumen. Tympanic membrane is not injected, perforated, erythematous or bulging.     Left Ear: Tympanic membrane normal. No tenderness. There is no impacted cerumen. Tympanic membrane is not injected, perforated, erythematous or bulging.     Ears:      Nose: Nose normal.     Mouth/Throat:     Mouth: Mucous membranes are moist.  Eyes:      General: No scleral icterus.       Right eye: No discharge.        Left eye: No discharge.     Extraocular Movements: Extraocular movements intact.  Cardiovascular:     Rate and Rhythm: Normal rate.  Pulmonary:     Effort: Pulmonary effort is normal.  Skin:    General: Skin is warm and dry.  Neurological:     General: No focal deficit present.     Mental Status: She is alert and oriented to person, place, and time.  Psychiatric:        Mood and Affect: Mood normal.        Behavior: Behavior normal.     Assessment and Plan :   PDMP not reviewed this encounter.  1. Other infective acute otitis externa of left ear    Will treat empirically for otitis externa with Ciprodex.  Recommend follow-up with an ENT specialist if her symptoms do not improve.  Counseled patient on potential for adverse effects with medications prescribed/recommended today, ER and return-to-clinic precautions discussed, patient verbalized understanding.    Wallis Bamberg, [place] 09/24/23 1616

## 2023-10-02 ENCOUNTER — Other Ambulatory Visit (INDEPENDENT_AMBULATORY_CARE_PROVIDER_SITE_OTHER): Payer: Self-pay | Admitting: Adult Health

## 2023-10-02 DIAGNOSIS — R7303 Prediabetes: Secondary | ICD-10-CM

## 2023-10-04 ENCOUNTER — Ambulatory Visit (INDEPENDENT_AMBULATORY_CARE_PROVIDER_SITE_OTHER): Payer: BC Managed Care – PPO | Admitting: Adult Health

## 2023-10-04 ENCOUNTER — Encounter (INDEPENDENT_AMBULATORY_CARE_PROVIDER_SITE_OTHER): Payer: Self-pay | Admitting: Adult Health

## 2023-10-04 VITALS — BP 136/78 | HR 68 | Temp 98.2°F | Ht 60.0 in | Wt 170.0 lb

## 2023-10-04 DIAGNOSIS — R7303 Prediabetes: Secondary | ICD-10-CM

## 2023-10-04 DIAGNOSIS — Z6833 Body mass index (BMI) 33.0-33.9, adult: Secondary | ICD-10-CM | POA: Diagnosis not present

## 2023-10-04 DIAGNOSIS — E669 Obesity, unspecified: Secondary | ICD-10-CM

## 2023-10-04 DIAGNOSIS — E66811 Obesity, class 1: Secondary | ICD-10-CM

## 2023-10-04 DIAGNOSIS — I10 Essential (primary) hypertension: Secondary | ICD-10-CM | POA: Diagnosis not present

## 2023-10-04 MED ORDER — METFORMIN HCL ER 750 MG PO TB24: ORAL_TABLET | ORAL | 0 refills | Status: DC

## 2023-10-04 NOTE — Progress Notes (Signed)
WEIGHT SUMMARY AND BIOMETRICS  Vitals Temp: 98.2 F (36.8 C) BP: 136/78 Pulse Rate: 68 SpO2: 97 %   Anthropometric Measurements Height: 5' (1.524 m) Weight: 170 lb (77.1 kg) BMI (Calculated): 33.2 Weight at Last Visit: 172lb Weight Lost Since Last Visit: 2lb Weight Gained Since Last Visit: 0 Starting Weight: 177lb Total Weight Loss (lbs): 7 lb (3.175 kg)   Body Composition  Body Fat %: 39.9 % Fat Mass (lbs): 68 lbs Muscle Mass (lbs): 97.2 lbs Total Body Water (lbs): 68.8 lbs Visceral Fat Rating : 11   Other Clinical Data Fasting: no Labs: no Today's Visit #: 21 Starting Date: 04/03/20    Chief Complaint:   OBESITY [name] is here to discuss her progress with her obesity treatment plan. She is on the keeping a food journal and adhering to recommended goals of 1200 calories and 90+ protein and states she is following her eating plan approximately  5 0 % of the time. She states she is exercising Walking 30 minutes 2 times per week.   Interim History:  09/24/2023 ED Encounter for infective acute otitis externa of left ear   Reviewed Bioimpedance results with pt: Muscle Mass: -0.4 lb Adipose Mass: -1.6 lbs  Of note- unable to tolerate GLP-1 therapy, Re: GI upset and elevated Lipase levels   Subjective:   1. Pre-diabetes Lab Results  Component Value Date   HGBA1C 5.9 (H) 05/04/2023   HGBA1C 5.6 12/16/2022   HGBA1C 5.6 09/09/2022  She is taking Metformin XR 750mg  - 1st meal and dinner daily She denies GI upset   Of note- unable to tolerate GLP-1 therapy, Re: GI upset and elevated Lipase levels   2. Essential hypertension BP slightly elevated at OV She denies CP with exertion. She has remote hx of tobacco use, > 45 years ago She is on  enalapril (VASOTEC) 20 MG tablet   Assessment/Plan:   1. Pre-diabetes Refill - metFORMIN (GLUCOPHAGE-XR) 750 MG 24 hr tablet; 1 tab with first meal and 1 tab with dinner  Dispense: 60 tablet; Refill: 0  2.  Essential hypertension Limit Na+ intake Continue  enalapril (VASOTEC) 20 MG tablet   3. Obesity with current BMI 33.2  [name] is currently in the action stage of change. As such, her goal is to continue with weight loss efforts. She has agreed to keeping a food journal and adhering to recommended goals of 1200 calories and 90+ protein.   Exercise goals: For substantial health benefits, adults should do at least 150 minutes (2 hours and 30 minutes) a week of moderate-intensity, or 75 minutes (1 hour and 15 minutes) a week of vigorous-intensity aerobic physical activity, or an equivalent combination of moderate- and vigorous-intensity aerobic activity. Aerobic activity should be performed in episodes of at least 10 minutes, and preferably, it should be spread throughout the week.  Behavioral modification strategies: increasing lean protein intake, decreasing simple carbohydrates, increasing vegetables, increasing water intake, no skipping meals, meal planning and cooking strategies, keeping healthy foods in the home, planning for success, and keeping a strict food journal.  Sujeily has agreed to follow-up with our clinic in 4 weeks. She was informed of the importance of frequent follow-up visits to maximize her success with intensive lifestyle modifications for her multiple health conditions.   Check Fasting Labs at next OV  Objective:   Blood pressure 136/78, pulse 68, temperature 98.2 F (36.8 C), height 5' (1.524 m), weight 170 lb (77.1 kg), SpO2 97%. Body mass index is 33.2  kg/m.  General: Cooperative, alert, well developed, in no acute distress. HEENT: Conjunctivae and lids unremarkable. Cardiovascular: Regular rhythm.  Lungs: Normal work of breathing. Neurologic: No focal deficits.   Lab Results  Component Value Date   CREATININE 0.80 04/28/2023   BUN 16 04/28/2023   NA 139 04/28/2023   K 4.1 04/28/2023   CL 103 04/28/2023   CO2 28 04/28/2023   Lab Results  Component  Value Date   ALT 18 12/16/2022   AST 15 12/16/2022   ALKPHOS 62 12/16/2022   BILITOT 0.5 12/16/2022   Lab Results  Component Value Date   HGBA1C 5.9 (H) 05/04/2023   HGBA1C 5.6 12/16/2022   HGBA1C 5.6 09/09/2022   HGBA1C 5.6 04/20/2022   HGBA1C 5.7 (H) 01/08/2022   Lab Results  Component Value Date   INSULIN 8.8 05/04/2023   INSULIN 6.5 12/16/2022   INSULIN 6.5 09/09/2022   INSULIN 7.9 04/20/2022   INSULIN 7.7 01/08/2022   Lab Results  Component Value Date   TSH 4.09 04/28/2023   Lab Results  Component Value Date   CHOL 259 (H) 09/09/2022   HDL 62 09/09/2022   LDLCALC 145 (H) 09/09/2022   TRIG 287 (H) 09/09/2022   CHOLHDL 4.2 09/09/2022   Lab Results  Component Value Date   VD25OH 40.7 05/04/2023   VD25OH 37.9 12/16/2022   VD25OH 32.7 09/09/2022   Lab Results  Component Value Date   WBC 6.6 09/09/2022   HGB 15.0 09/09/2022   HCT 43.8 09/09/2022   MCV 97 09/09/2022   PLT 354 09/09/2022   No results found for: "IRON", "TIBC", "FERRITIN"  Attestation Statements:   Reviewed by clinician on day of visit: allergies, medications, problem list, medical history, surgical history, family history, social history, and previous encounter notes.  I have reviewed the above documentation for accuracy and completeness, and I agree with the above. -  Turon Kilmer d. Dashun Borre, NP-C

## 2023-10-27 ENCOUNTER — Encounter: Payer: Self-pay | Admitting: Orthopedic Surgery

## 2023-10-27 ENCOUNTER — Ambulatory Visit: Payer: BC Managed Care – PPO | Admitting: Orthopedic Surgery

## 2023-10-27 ENCOUNTER — Other Ambulatory Visit (INDEPENDENT_AMBULATORY_CARE_PROVIDER_SITE_OTHER): Payer: Self-pay

## 2023-10-27 VITALS — Ht 60.0 in | Wt 170.0 lb

## 2023-10-27 DIAGNOSIS — M545 Low back pain, unspecified: Secondary | ICD-10-CM

## 2023-10-27 DIAGNOSIS — G8929 Other chronic pain: Secondary | ICD-10-CM | POA: Diagnosis not present

## 2023-10-27 MED ORDER — PREGABALIN 75 MG PO CAPS: 75.0000 mg | ORAL_CAPSULE | Freq: Two times a day (BID) | ORAL | 0 refills | Status: DC

## 2023-10-27 MED ORDER — METHYLPREDNISOLONE 4 MG PO TBPK: ORAL_TABLET | ORAL | 0 refills | Status: DC
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
## 2023-10-28 ENCOUNTER — Other Ambulatory Visit: Payer: Self-pay

## 2023-10-28 ENCOUNTER — Telehealth: Payer: Self-pay | Admitting: Orthopedic Surgery

## 2023-10-28 MED ORDER — PREGABALIN 75 MG PO CAPS: 75.0000 mg | ORAL_CAPSULE | Freq: Two times a day (BID) | ORAL | 0 refills | Status: DC

## 2023-10-28 NOTE — Telephone Encounter (Signed)
Patient called advised the [facility] on [address] do not have the Rx Lyrica in stock and don't know when they will get it in.      Patient  asked if the [facility] can [facility] to [facility] [id number] [facility]? The number to contact patient is [phone]

## 2023-11-08 ENCOUNTER — Other Ambulatory Visit: Payer: Self-pay | Admitting: Radiology

## 2023-11-08 ENCOUNTER — Encounter: Payer: Self-pay | Admitting: Orthopedic Surgery

## 2023-11-08 ENCOUNTER — Ambulatory Visit (INDEPENDENT_AMBULATORY_CARE_PROVIDER_SITE_OTHER): Payer: BC Managed Care – PPO | Admitting: Adult Health

## 2023-11-08 ENCOUNTER — Encounter (INDEPENDENT_AMBULATORY_CARE_PROVIDER_SITE_OTHER): Payer: Self-pay | Admitting: Adult Health

## 2023-11-08 VITALS — BP 146/90 | HR 68 | Temp 97.8°F | Ht 60.0 in | Wt 173.0 lb

## 2023-11-08 DIAGNOSIS — R7303 Prediabetes: Secondary | ICD-10-CM | POA: Diagnosis not present

## 2023-11-08 DIAGNOSIS — I10 Essential (primary) hypertension: Secondary | ICD-10-CM | POA: Diagnosis not present

## 2023-11-08 DIAGNOSIS — G8929 Other chronic pain: Secondary | ICD-10-CM

## 2023-11-08 DIAGNOSIS — E559 Vitamin D deficiency, unspecified: Secondary | ICD-10-CM

## 2023-11-08 DIAGNOSIS — Z6833 Body mass index (BMI) 33.0-33.9, adult: Secondary | ICD-10-CM

## 2023-11-08 DIAGNOSIS — M5441 Lumbago with sciatica, right side: Secondary | ICD-10-CM

## 2023-11-08 DIAGNOSIS — E66812 Obesity, class 2: Secondary | ICD-10-CM

## 2023-11-08 DIAGNOSIS — E669 Obesity, unspecified: Secondary | ICD-10-CM

## 2023-11-08 DIAGNOSIS — E66811 Obesity, class 1: Secondary | ICD-10-CM

## 2023-11-08 MED ORDER — VITAMIN D (ERGOCALCIFEROL) 1.25 MG (50000 UNIT) PO CAPS: 50000.0000 [IU] | ORAL_CAPSULE | ORAL | 0 refills | Status: DC

## 2023-11-08 MED ORDER — METFORMIN HCL ER 750 MG PO TB24: ORAL_TABLET | ORAL | 0 refills | Status: DC
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
## 2023-11-09 LAB — COMPREHENSIVE METABOLIC PANEL
ALT: 17 [IU]/L (ref 0–32)
AST: 15 [IU]/L (ref 0–40)
Albumin: 4.3 g/dL (ref 3.9–4.9)
Alkaline Phosphatase: 74 [IU]/L (ref 44–121)
BUN/Creatinine Ratio: 23 (ref 12–28)
BUN: 18 mg/dL (ref 8–27)
Bilirubin Total: 0.3 mg/dL (ref 0.0–1.2)
CO2: 21 mmol/L (ref 20–29)
Calcium: 9.6 mg/dL (ref 8.7–10.3)
Chloride: 105 mmol/L (ref 96–106)
Creatinine, Ser: 0.78 mg/dL (ref 0.57–1.00)
Globulin, Total: 2.3 g/dL (ref 1.5–4.5)
Glucose: 96 mg/dL (ref 70–99)
Potassium: 4.2 mmol/L (ref 3.5–5.2)
Sodium: 139 mmol/L (ref 134–144)
Total Protein: 6.6 g/dL (ref 6.0–8.5)
eGFR: 85 mL/min/{1.73_m2} (ref >59)

## 2023-11-09 LAB — HEMOGLOBIN A1C
Est. average glucose Bld gHb Est-mCnc: 123 mg/dL
Hgb A1c MFr Bld: 5.9 % — ABNORMAL HIGH (ref 4.8–5.6)

## 2023-11-09 LAB — INSULIN, RANDOM: INSULIN: 14.8 u[IU]/mL (ref 2.6–24.9)

## 2023-11-09 LAB — VITAMIN D 25 HYDROXY (VIT D DEFICIENCY, FRACTURES): Vit D, 25-Hydroxy: 50.7 ng/mL (ref 30.0–100.0)

## 2023-11-18 ENCOUNTER — Other Ambulatory Visit: Payer: Self-pay | Admitting: Orthopedic Surgery

## 2023-11-18 MED ORDER — PREGABALIN 75 MG PO CAPS: 75.0000 mg | ORAL_CAPSULE | Freq: Two times a day (BID) | ORAL | 0 refills | Status: DC

## 2023-11-22 ENCOUNTER — Ambulatory Visit
Admission: RE | Admit: 2023-11-22 | Discharge: 2023-11-22 | Discharge status: Home / Self Care | Payer: BC Managed Care – PPO | Source: Ambulatory Visit | Attending: Orthopedic Surgery

## 2023-11-22 DIAGNOSIS — M545 Low back pain, unspecified: Secondary | ICD-10-CM

## 2023-11-24 ENCOUNTER — Ambulatory Visit: Payer: 59 | Admitting: Family Medicine

## 2023-11-24 VITALS — BP 136/80 | HR 69 | Temp 98.7°F | Ht 60.0 in | Wt 179.6 lb

## 2023-11-24 DIAGNOSIS — E039 Hypothyroidism, unspecified: Secondary | ICD-10-CM

## 2023-11-24 DIAGNOSIS — S91002A Unspecified open wound, left ankle, initial encounter: Secondary | ICD-10-CM | POA: Diagnosis not present

## 2023-11-24 DIAGNOSIS — M79672 Pain in left foot: Secondary | ICD-10-CM | POA: Diagnosis not present

## 2023-11-24 DIAGNOSIS — R7303 Prediabetes: Secondary | ICD-10-CM

## 2023-11-24 DIAGNOSIS — I10 Essential (primary) hypertension: Secondary | ICD-10-CM

## 2023-11-24 LAB — TSH: TSH: 6.06 u[IU]/mL — ABNORMAL HIGH (ref 0.35–5.50)

## 2023-11-24 NOTE — Progress Notes (Signed)
 Subjective:  Patient ID: [name], female    DOB: August 07, 1961  Age: 63 y.o. MRN: [id number]  CC:  Chief Complaint  [name] with   Medical Management of Chronic Issues    6 month thyroid  check,    Wound Check    Pt notes cut on her heel Lt foot, got 4 months ago labor day, caught by the screen door had it looked at by UC the next day, notes still burns and hurts, not open but some redness in the area     HPI [name] for  Chronic conditions as well as acute issue above  Cut on left foot Seen by urgent care day after it occurred, occurred approximately 4 months ago.  Cut on screen door. No stitches, pressure dressing. Wound healed, but still some soreness, burning sensation or if something pushes on it. Some redness at times.  Pain is worse in am going downstairs.   Tx: none.   Prediabetes: Has been followed by healthy weight and wellness, treated with metformin  750 mg daily and vitamin D  supplementation. No new side effects.  Lab Results  Component Value Date   HGBA1C 5.9 (H) 11/08/2023   Wt Readings from Last 3 Encounters:  11/24/23 179 lb 9.6 oz (81.5 kg)  11/08/23 173 lb (78.5 kg)  10/27/23 170 lb (77.1 kg)   Hypertension: Enalapril  20 mg daily.  Coronary calcium  score of 0 in May 2023 with repeat in 5 years.  No meds for elevated lipids. Home readings: 130/80 range.  BP Readings from Last 3 Encounters:  11/24/23 136/80  11/08/23 (!) 146/90  10/04/23 136/78   Lab Results  Component Value Date   CREATININE 0.78 11/08/2023   Hypothyroidism: Lab Results  Component Value Date   TSH 4.09 04/28/2023  Synthroid  88 mcg daily Taking medication daily.  No new hot or cold intolerance. No new hair or skin changes, heart palpitations or new fatigue. No new weight changes.        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   Anemia    Back pain    Blood transfusion without reported diagnosis    post-operatively in 20s after L1 fracture with spinal cord injury after horseback riding.   Chronic kidney disease    Tuberous Sclerosis; [facility] at [facility].   Edema of both lower extremities    Hyperlipidemia    Hypertension    Hypothyroidism    Lower back pain    Pre-diabetes    Thyroid  disease    Vitamin D  deficiency    Past Surgical History:  Procedure Laterality Date   ABDOMINAL HYSTERECTOMY     DUB; ovaries remaining.   CESAREAN SECTION     MENISCUS REPAIR     Sleep Study  12/17/2010   No sleep disordered breathing or periodic limb mvmets.  Excess light sleep.   SPINE SURGERY     L1 fracture with spinal  cord injury with horseback riding.   TOTAL VAGINAL HYSTERECTOMY  2008   TUBAL LIGATION     Allergies  Allergen Reactions   Saxenda  [Liraglutide  -Weight Management] Nausea And Vomiting    With borderline high lipase - concern for possible pancreatitis.    Sulfa Antibiotics Hives   Lipitor [Atorvastatin ] Other (See Comments)    Fatigue and confusion   Prior to Admission medications   Medication Sig Start Date End Date Taking? Authorizing Provider  enalapril  (VASOTEC ) 20 MG tablet Take 1 tablet (20 mg total) by mouth daily. 04/28/23  Yes Deanna Reyes SAUNDERS, MD  levothyroxine  (SYNTHROID ) 88 MCG tablet Take 1 tablet (88 mcg total) by mouth daily. 04/28/23  Yes Deanna Reyes SAUNDERS, MD  meloxicam  (MOBIC ) 7.5 MG tablet Take 1 tablet (7.5 mg total) by mouth daily as needed  for pain. 05/11/23  Yes Jule Ronal CROME, PA-C  metFORMIN  (GLUCOPHAGE -XR) 750 MG 24 hr tablet 1 tab with first meal and 1 tab with dinner 11/08/23  Yes Danford, Katy D, NP  methylPREDNISolone  (MEDROL  DOSEPAK) 4 MG TBPK tablet Take as prescribed on the box 10/27/23  Yes Georgina Ozell LABOR, MD  pregabalin  (LYRICA ) 75 MG capsule Take 1 capsule (75 mg total) by mouth 2 (two) times daily. 11/18/23 12/18/23 Yes Georgina Ozell LABOR, MD  Vitamin D , Ergocalciferol , (DRISDOL ) 1.25 MG (50000 UNIT) CAPS capsule Take 1 capsule (50,000 Units total) by mouth every 7 (seven) days. 11/08/23  Yes Danford, Rockie BIRCH, NP   Social History   Socioeconomic History   Marital status: Married    Spouse name: [name]   Number of children: Not on file   Years of education: Not on file   Highest education level: Master's degree (e.g., MA, MS, MEng, MEd, MSW, MBA)  Occupational History   Occupation: Tourist Information Centre Manager  Tobacco Use   Smoking status: Never   Smokeless tobacco: Never  Vaping Use   Vaping status: Never Used  Substance and Sexual Activity   Alcohol use: Yes    Comment: occ   Drug use: No   Sexual activity: Yes    Birth control/protection: None  Other Topics Concern   Not on file  Social History Narrative   Marital status: married x 20+ years; happily married.      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      Tobacco; none      Alcohol:  Socially      Drugs: none      Exercise: sporadic   Social Drivers of Corporate Investment Banker Strain: Low Risk  (04/24/2023)   Overall Financial Resource Strain (CARDIA)    Difficulty of Paying Living Expenses: Not hard at all  Food Insecurity: No Food Insecurity (04/24/2023)   Hunger Vital Sign    Worried About Running Out of Food in the Last Year: Never true    Ran Out of Food in the Last Year: Never true  Transportation Needs: No Transportation Needs (04/24/2023)   PRAPARE -  Administrator, Civil Service (Medical): No    Lack of Transportation (Non-Medical): No  Physical Activity: Insufficiently Active (04/24/2023)   Exercise Vital Sign    Days of Exercise per Week: 4 days    Minutes of Exercise per Session: 30 min  Stress: No Stress Concern Present (04/24/2023)   [employer] of Occupational Health - Occupational Stress Questionnaire  Feeling of Stress : Only a little  Social Connections: Moderately Isolated (04/24/2023)   Social Connection and Isolation Panel [NHANES]    Frequency of Communication with Friends and Family: More than three times a week    Frequency of Social Gatherings with Friends and Family: More than three times a week    Attends Religious Services: Never    Database Administrator or Organizations: No    Attends Engineer, Structural: Not on file    Marital Status: Married  Catering Manager Violence: Not on file    Review of Systems Per HPI  Objective:   Vitals:   11/24/23 0907  BP: 136/80  Pulse: 69  Temp: 98.7 F (37.1 C)  TempSrc: Temporal  SpO2: 99%  Weight: 179 lb 9.6 oz (81.5 kg)  Height: 5' (1.524 m)     Physical Exam Vitals reviewed.  Constitutional:      Appearance: Normal appearance. She is well-developed.  HENT:     Head: Normocephalic and atraumatic.  Eyes:     Conjunctiva/sclera: Conjunctivae normal.     Pupils: Pupils are equal, round, and reactive to light.  Neck:     Vascular: No carotid bruit.  Cardiovascular:     Rate and Rhythm: Normal rate and regular rhythm.     Heart sounds: Normal heart sounds.  Pulmonary:     Effort: Pulmonary effort is normal.     Breath sounds: Normal breath sounds.  Abdominal:     Palpations: Abdomen is soft. There is no pulsatile mass.     Tenderness: There is no abdominal tenderness.  Musculoskeletal:     Right lower leg: No edema.     Left lower leg: No edema.     Comments: L ankle, TTP distal achilles and overlying healed wound without  defect, full plantar strength. Negative thompsons.  Wound healed without surrounding erythema or exudate.   Skin:    General: Skin is warm and dry.  Neurological:     Mental Status: She is alert and oriented to person, place, and time.  Psychiatric:        Mood and Affect: Mood normal.        Behavior: Behavior normal.     Assessment & Plan:  [name] is a 63 y.o. female . Pain of left heel - Plan: Ambulatory referral to Orthopedic Surgery Wound of left ankle, initial encounter - Plan: Ambulatory referral to Orthopedic Surgery  -Heel wound appears to be healed.  Based on initial injury and persistent symptoms, question possible deeper involvement, including possible Achilles involvement but no sign of rupture, strength intact.  Due to persistent symptoms will refer her to orthopedic surgery to decide on further testing or treatment.  Pre-diabetes  -Followed by healthy weight loss, continue same, no med changes at this time.  Recent A1c noted.  Essential hypertension  -Stable on current regimen, no med changes at this time.  Home monitoring with RTC precautions if persistent elevated readings.  Hypothyroidism, unspecified type - Plan: TSH  -TSH level obtained and we will adjust plan accordingly.  No orders of the defined types were placed in this encounter.  Patient Instructions  I am suspicious that you may have injured some of the structure below the skin at the ankle area with the persistent soreness, but the Achilles tendon appears to still be intact.  I think it might be worthwhile meeting with orthopedics to discuss other treatments to that area and I have placed that referral.  No change in chronic medications at this time.  If any concerns on your thyroid  test I will let you know, keep follow-up with healthy weight and wellness.  If blood pressures are above 140/90 consistently let me know but I do not think any changes are needed at this time.  Thank you again for  coming in today and take care.    Signed,   Reyes Pines, MD [facility], [facility] 11/24/23 9:21 AM

## 2023-11-24 NOTE — Patient Instructions (Signed)
 I am suspicious that you may have injured some of the structure below the skin at the ankle area with the persistent soreness, but the Achilles tendon appears to still be intact.  I think it might be worthwhile meeting with orthopedics to discuss other treatments to that area and I have placed that referral.  No change in chronic medications at this time.  If any concerns on your thyroid  test I will let you know, keep follow-up with healthy weight and wellness.  If blood pressures are above 140/90 consistently let me know but I do not think any changes are needed at this time.  Thank you again for coming in today and take care.

## 2023-11-25 ENCOUNTER — Ambulatory Visit (INDEPENDENT_AMBULATORY_CARE_PROVIDER_SITE_OTHER): Payer: 59 | Admitting: Orthopedic Surgery

## 2023-11-25 ENCOUNTER — Encounter: Payer: Self-pay | Admitting: Family Medicine

## 2023-11-25 DIAGNOSIS — M5416 Radiculopathy, lumbar region: Secondary | ICD-10-CM | POA: Diagnosis not present

## 2023-11-25 NOTE — Progress Notes (Addendum)
 Orthopedic Spine Surgery Office Note   Assessment: Patient is a 63 y.o. female with chronic low back pain with new pain that radiates into bilateral posterior lateral thighs. 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 A new prescription was provided to her today -I told her she could try a diagnostic, therapeutic injection for additional pain relief. Referral provided to her today -Could consider PT as an additional non-operative treatment to try in the future -Patient should return to office in 4-5 weeks, x-rays at next visit: none     Patient expressed understanding of the plan and all questions were answered to the patient's satisfaction.    ___________________________________________________________________________     History:   Patient is a 63 y.o. female who presents today for follow up on her lumbar spine. Patient has a long history of back pain but within the last three months, she has developed worsening back pain that radiates into her bilateral buttock and posterior thighs. Since our last visit, she tried a medrol  dose pak and found this very helpful. Pain gradually returned but not as severe as it was after completing the prednisone. She has been taking lyrica  and feels that this is helpful as well. She has not developed any new symptoms since she was last seen.      Treatments tried: Tylenol , meloxicam , medrol  dose pak, lyrica      Physical Exam:   General: no acute distress, appears stated age Neurologic: alert, answering questions appropriately, following commands Respiratory: unlabored breathing on room air, symmetric chest rise Psychiatric: appropriate affect, normal cadence to speech     MSK (spine):   -Strength exam                                                   Left                  Right EHL                               5/5                  5/5 TA                                 5/5                  5/5 GSC                             5/5                  5/5 Knee extension            5/5                  5/5 Hip flexion                    5/5                  5/5   -Sensory  exam                           Sensation intact to light touch in L3-S1 nerve distributions of bilateral lower extremities   -Achilles DTR: 2/4 on the left, 2/4 on the right -Patellar tendon DTR: 2/4 on the left, 2/4 on the right   -Straight leg raise: Negative bilaterally -Clonus: no beats bilaterally   Imaging: XRs of the lumbar spine from 10/27/2023 were previously independently reviewed and interpreted, showing Harrington rods with wiring as well.  Posterior fixation from T11-L3.  Chronic appearing fracture with anterior height loss at L1.  Disc height loss with anterior osteophyte formation at L3/4 and L4/5.  Spondylolisthesis seen at L4/5.  Spondylolisthesis shifts about 2.7 mm between flexion and extension views.  No acute fracture seen.  No dislocation seen.   MRI of the lumbar spine from 11/22/2023 was independently reviewed and interpreted, showing central and lateral recess stenosis at L4/5. Spondylolisthesis seen L4/5. T2 signal within the left L4/5 facet that measures about 0.37mm. Facet arthropathy at L4/5 and L5/S1. 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     Patient name: [name] Patient MRN: [id number] Date of visit: 11/25/23

## 2023-11-26 ENCOUNTER — Telehealth: Payer: Self-pay | Admitting: Orthopedic Surgery

## 2023-11-26 MED ORDER — PREGABALIN 75 MG PO CAPS: 75.0000 mg | ORAL_CAPSULE | Freq: Two times a day (BID) | ORAL | 2 refills | Status: DC

## 2023-11-26 NOTE — Addendum Note (Signed)
 Addended by: Willia Craze on: 11/26/2023 09:44 AM   Modules accepted: Orders, Level of Service

## 2023-11-26 NOTE — Telephone Encounter (Signed)
 Patient needs her refill to go to [facility] on  [address]. CB#[phone]

## 2023-11-26 NOTE — Telephone Encounter (Signed)
 Patient called needs a refill on pregablin. CB#[phone]

## 2023-11-27 ENCOUNTER — Other Ambulatory Visit: Payer: Self-pay | Admitting: Family Medicine

## 2023-11-27 ENCOUNTER — Encounter: Payer: Self-pay | Admitting: Family Medicine

## 2023-11-27 DIAGNOSIS — E039 Hypothyroidism, unspecified: Secondary | ICD-10-CM

## 2023-11-27 NOTE — Progress Notes (Signed)
 See lab notes

## 2023-11-29 ENCOUNTER — Ambulatory Visit: Payer: BC Managed Care – PPO | Admitting: Family Medicine

## 2023-11-30 ENCOUNTER — Other Ambulatory Visit (INDEPENDENT_AMBULATORY_CARE_PROVIDER_SITE_OTHER): Payer: Self-pay | Admitting: Adult Health

## 2023-11-30 ENCOUNTER — Encounter: Payer: 59 | Admitting: Orthopedic Surgery

## 2023-11-30 ENCOUNTER — Ambulatory Visit: Payer: 59 | Admitting: Orthopedic Surgery

## 2023-11-30 DIAGNOSIS — R7303 Prediabetes: Secondary | ICD-10-CM

## 2023-12-02 ENCOUNTER — Ambulatory Visit: Payer: 59 | Admitting: Physician Assistant

## 2023-12-02 ENCOUNTER — Encounter: Payer: Self-pay | Admitting: Physician Assistant

## 2023-12-02 DIAGNOSIS — M170 Bilateral primary osteoarthritis of knee: Secondary | ICD-10-CM

## 2023-12-02 MED ORDER — BUPIVACAINE HCL 0.25 % IJ SOLN
0.6600 mL | INTRAMUSCULAR | Status: AC | PRN
  Administered 2023-12-02: .66 mL via INTRA_ARTICULAR

## 2023-12-02 MED ORDER — LIDOCAINE HCL 1 % IJ SOLN
3.0000 mL | INTRAMUSCULAR | Status: AC | PRN
  Administered 2023-12-02: 3 mL

## 2023-12-02 MED ORDER — METHYLPREDNISOLONE ACETATE 40 MG/ML IJ SUSP
13.3300 mg | INTRAMUSCULAR | Status: AC | PRN
  Administered 2023-12-02: 13.33 mg via INTRA_ARTICULAR

## 2023-12-02 NOTE — Progress Notes (Signed)
Office Visit Note   Patient: [name]           Date of Birth: [birth year]           MRN: [id number] Visit Date: 12/02/2023              Requested by: [name], MD [address] [place],  [place] [address] PCP: [name], MD   Assessment & Plan: Visit Diagnoses:  1. Bilateral primary osteoarthritis of knee     Plan: Impression is bilateral knee osteoarthritis.  Today, we proceeded with repeat bilateral knee cortisone injections.  She tolerated this well.  She will follow-up with [place] as needed.  Follow-Up Instructions: Return if symptoms worsen or fail to improve.   Orders:  Orders Placed This Encounter  Procedures   Large Joint Inj   No orders of the defined types were placed in this encounter.     Procedures: Large Joint Inj: bilateral knee on 12/02/2023 10:03 AM Indications: pain Details: 22 G needle, anterolateral approach Medications (Right): 0.66 mL bupivacaine 0.25 %; 3 mL lidocaine 1 %; 13.33 mg methylPREDNISolone acetate 40 MG/ML Medications (Left): 0.66 mL bupivacaine 0.25 %; 3 mL lidocaine 1 %; 13.33 mg methylPREDNISolone acetate 40 MG/ML      Clinical Data: No additional findings.   Subjective: Chief Complaint  Patient presents with   Right Knee - Pain   Left Knee - Pain    HPI patient is a pleasant 63 year old female who comes in today with recurrent bilateral knee pain.  I saw her back in June of last year for bilateral knee OA.  Bilateral knee cortisone injections were performed.  She had significant relief following the injections but notes her pain has gradually started to return.  Majority of her pain is to the medial knee and is worse with descending stairs.  She takes pregabalin for pain.    Review of Systems as detailed in HPI.  All others reviewed and are negative.   Objective: Vital Signs: There were no vitals taken for this visit.  Physical Exam well-developed well-nourished female in no acute distress.   Alert and oriented x 3.  Ortho ExamExamination of both knees: No effusion.  Range of motion 0 to 120 degrees.  Moderate patellofemoral crepitus.  No joint line tenderness.  She is neurovascular intact distally.  Specialty Comments:  No specialty comments available.  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   Anemia    Back pain    Blood transfusion without reported diagnosis    post-operatively in 20s after L1 fracture with spinal cord injury after horseback riding.   Chronic kidney disease    Tuberous Sclerosis; [facility] at [facility].   Edema of both lower extremities    Hyperlipidemia    Hypertension    Hypothyroidism    Lower back pain    Pre-diabetes  Thyroid disease    Vitamin D deficiency     Family History  Problem Relation Age of Onset   Thyroid disease Mother    Arthritis Mother    Asthma Mother    Migraines Mother    High blood pressure Mother    Sleep apnea Mother    Obesity Mother    Heart failure Father    Hyperlipidemia Father    Heart disease Father 14       AMI age 23; CABG at 26.   Arthritis Father    High Cholesterol Father    AAA (abdominal aortic aneurysm) Father     Past Surgical History:  Procedure Laterality Date   ABDOMINAL HYSTERECTOMY     DUB; ovaries remaining.   CESAREAN SECTION     MENISCUS REPAIR     Sleep Study  12/17/2010   No sleep disordered  breathing or periodic limb mvmets.  Excess light sleep.   SPINE SURGERY     L1 fracture with spinal cord injury with horseback riding.   TOTAL VAGINAL HYSTERECTOMY  2008   TUBAL LIGATION     Social History   Occupational History   Occupation: Tourist information centre manager  Tobacco Use   Smoking status: Never   Smokeless tobacco: Never  Vaping Use   Vaping status: Never Used  Substance and Sexual Activity   Alcohol use: Yes    Comment: occ   Drug use: No   Sexual activity: Yes    Birth control/protection: None

## 2023-12-06 ENCOUNTER — Other Ambulatory Visit: Payer: Self-pay | Admitting: Orthopedic Surgery

## 2023-12-06 DIAGNOSIS — N281 Cyst of kidney, acquired: Secondary | ICD-10-CM

## 2023-12-07 ENCOUNTER — Encounter (INDEPENDENT_AMBULATORY_CARE_PROVIDER_SITE_OTHER): Payer: Self-pay | Admitting: Adult Health

## 2023-12-07 ENCOUNTER — Ambulatory Visit (INDEPENDENT_AMBULATORY_CARE_PROVIDER_SITE_OTHER): Payer: 59 | Admitting: Adult Health

## 2023-12-07 ENCOUNTER — Other Ambulatory Visit: Payer: 59

## 2023-12-07 VITALS — BP 132/84 | HR 69 | Temp 97.7°F | Ht 60.0 in | Wt 174.0 lb

## 2023-12-07 DIAGNOSIS — I10 Essential (primary) hypertension: Secondary | ICD-10-CM

## 2023-12-07 DIAGNOSIS — E559 Vitamin D deficiency, unspecified: Secondary | ICD-10-CM | POA: Diagnosis not present

## 2023-12-07 DIAGNOSIS — R7303 Prediabetes: Secondary | ICD-10-CM | POA: Diagnosis not present

## 2023-12-07 DIAGNOSIS — E669 Obesity, unspecified: Secondary | ICD-10-CM

## 2023-12-07 DIAGNOSIS — E039 Hypothyroidism, unspecified: Secondary | ICD-10-CM | POA: Diagnosis not present

## 2023-12-07 DIAGNOSIS — E66811 Obesity, class 1: Secondary | ICD-10-CM

## 2023-12-07 DIAGNOSIS — Z6833 Body mass index (BMI) 33.0-33.9, adult: Secondary | ICD-10-CM

## 2023-12-07 DIAGNOSIS — E66812 Obesity, class 2: Secondary | ICD-10-CM

## 2023-12-07 MED ORDER — METFORMIN HCL ER 750 MG PO TB24: ORAL_TABLET | ORAL | 0 refills | Status: DC

## 2023-12-07 NOTE — Progress Notes (Signed)
WEIGHT SUMMARY AND BIOMETRICS  Vitals Temp: 97.7 F (36.5 C) BP: 132/84 Pulse Rate: 69 SpO2: 96 %   Anthropometric Measurements Height: 5' (1.524 m) Weight: 174 lb (78.9 kg) BMI (Calculated): 33.98 Weight at Last Visit: 173lb Weight Lost Since Last Visit: 0 Weight Gained Since Last Visit: 1lb Starting Weight: 177lb Total Weight Loss (lbs): 3 lb (1.361 kg)   Body Composition  Body Fat %: 40.7 % Fat Mass (lbs): 70.8 lbs Muscle Mass (lbs): 98 lbs Total Body Water (lbs): 68.6 lbs Visceral Fat Rating : 12   Other Clinical Data Fasting: no Labs: no Today's Visit #: 23 Starting Date: 04/03/20    Chief Complaint:   OBESITY [name] is here to discuss her progress with her obesity treatment plan. She is on the keeping a food journal and adhering to recommended goals of 1200 calories and 90+ protein and states she is following her eating plan approximately 50 % of the time. She states she is exercising: None    Interim History:  Last OV at [facility] on 11/08/2023 Her husband suffered MI on 11/19/2023 She was able to get him to medical care and he underwent PCI- required one stent He is home-stable  [name] is working closely with her Orthopedic care team for chronic R knee and chronic LBD She hopes to resume water aerobics and brisk walking this winter.  She and her best friend, recently booked their [place] vacation- [place] and [place] in April 2025. Health goal- to be able to travel comfortably  Subjective:   1. Pre-diabetes Discussed Labs  Latest Reference Range & Units 11/08/23 09:15  Glucose 70 - 99 mg/dL 96  Hemoglobin U4Q 4.8 - 5.6 % 5.9 (H)  Est. average glucose Bld gHb Est-mCnc mg/dL 034  INSULIN 2.6 - 74.2 uIU/mL 14.8  (H): Data is abnormally high  A1c and Insulin levels slightly above goal CBG at goal  2. Essential hypertension Discussed Labs BP slightly elevated at OV 11/08/2023 CMP: Electrolytes, kidney/liver enzymes are all stable  3.  Vitamin D deficiency Discussed Labs  Latest Reference Range & Units 12/16/22 11:58 05/04/23 09:49 11/08/23 09:15  Vitamin D, 25-Hydroxy 30.0 - 100.0 ng/mL 37.9 40.7 50.7   Level therapeutic  She is on weekly Ergocalciferol  4. Hypothyroidism, unspecified type Discussed Labs  Latest Reference Range & Units 11/24/23 09:47  TSH 0.35 - 5.50 uIU/mL 6.06 (H)  (H): Data is abnormally high  TSH above goal PCP is managing Synthroid 88 mcg She denies hot/cold intolerances, increased anxiety, and palpitations.  Assessment/Plan:   1. Pre-diabetes (Primary) Refill metFORMIN (GLUCOPHAGE-XR) 750 MG 24 hr tablet 1 tab with first meal and 1 tab with dinner Dispense: 60 tablet, Refills: 0 ordered   2. Essential hypertension Limit Na+ intake Increase activity as tolerated  3. Vitamin D deficiency Refill Vitamin D, Ergocalciferol, (DRISDOL) 1.25 MG (50000 UNIT) CAPS capsule Take 1 capsule (50,000 Units total) by mouth every 7 (seven) days. Dispense: 8 capsule, Refills: 0 ordered   4. Hypothyroidism, unspecified type F/u with PCP for additional lab draw  5. Obesity with current BMI 33.98  [name] is not currently in the action stage of change. As such, her goal is to get back to weightloss efforts . She has agreed to keeping a food journal and adhering to recommended goals of 1200 calories and 90+ protein.   Exercise goals: All adults should avoid inactivity. Some physical activity is better than none, and adults who participate in any amount of physical activity  gain some health benefits. Adults should also include muscle-strengthening activities that involve all major muscle groups on 2 or more days a week.  Behavioral modification strategies: increasing lean protein intake, decreasing simple carbohydrates, increasing vegetables, increasing water intake, meal planning and cooking strategies, keeping healthy foods in the home, ways to avoid boredom eating, and planning for success.  [name]  has agreed to follow-up with our clinic in 4 weeks. She was informed of the importance of frequent follow-up visits to maximize her success with intensive lifestyle modifications for her multiple health conditions.   Objective:   Blood pressure 132/84, pulse 69, temperature 97.7 F (36.5 C), height 5' (1.524 m), weight 174 lb (78.9 kg), SpO2 96%. Body mass index is 33.98 kg/m.  General: Cooperative, alert, well developed, in no acute distress. HEENT: Conjunctivae and lids unremarkable. Cardiovascular: Regular rhythm.  Lungs: Normal work of breathing. Neurologic: No focal deficits.   Lab Results  Component Value Date   CREATININE 0.78 11/08/2023   BUN 18 11/08/2023   NA 139 11/08/2023   K 4.2 11/08/2023   CL 105 11/08/2023   CO2 21 11/08/2023   Lab Results  Component Value Date   ALT 17 11/08/2023   AST 15 11/08/2023   ALKPHOS 74 11/08/2023   BILITOT 0.3 11/08/2023   Lab Results  Component Value Date   HGBA1C 5.9 (H) 11/08/2023   HGBA1C 5.9 (H) 05/04/2023   HGBA1C 5.6 12/16/2022   HGBA1C 5.6 09/09/2022   HGBA1C 5.6 04/20/2022   Lab Results  Component Value Date   INSULIN 14.8 11/08/2023   INSULIN 8.8 05/04/2023   INSULIN 6.5 12/16/2022   INSULIN 6.5 09/09/2022   INSULIN 7.9 04/20/2022   Lab Results  Component Value Date   TSH 6.06 (H) 11/24/2023   Lab Results  Component Value Date   CHOL 259 (H) 09/09/2022   HDL 62 09/09/2022   LDLCALC 145 (H) 09/09/2022   TRIG 287 (H) 09/09/2022   CHOLHDL 4.2 09/09/2022   Lab Results  Component Value Date   VD25OH 50.7 11/08/2023   VD25OH 40.7 05/04/2023   VD25OH 37.9 12/16/2022   Lab Results  Component Value Date   WBC 6.6 09/09/2022   HGB 15.0 09/09/2022   HCT 43.8 09/09/2022   MCV 97 09/09/2022   PLT 354 09/09/2022   No results found for: "IRON", "TIBC", "FERRITIN"  Attestation Statements:   Reviewed by clinician on day of visit: allergies, medications, problem list, medical history, surgical history,  family history, social history, and previous encounter notes.  I have reviewed the above documentation for accuracy and completeness, and I agree with the above. -  Wells Mabe d. Aviana Shevlin, NP-C

## 2023-12-08 ENCOUNTER — Ambulatory Visit: Payer: 59 | Admitting: Family Medicine

## 2023-12-08 ENCOUNTER — Encounter: Payer: Self-pay | Admitting: Family Medicine

## 2023-12-08 VITALS — BP 126/70 | HR 72 | Temp 98.2°F | Wt 180.8 lb

## 2023-12-08 DIAGNOSIS — R7989 Other specified abnormal findings of blood chemistry: Secondary | ICD-10-CM | POA: Diagnosis not present

## 2023-12-08 DIAGNOSIS — N281 Cyst of kidney, acquired: Secondary | ICD-10-CM

## 2023-12-08 NOTE — Patient Instructions (Signed)
Thanks for coming in today.  I suspect that the renal ultrasound will be reassuring and probable simple cyst but if any concerns we can certainly refer you back to your nephrologist at [facility]  I did compare to the previous ultrasound in 2013 and there was a lesion that appears to have been larger back then.  Again we can see what the ultrasound shows and go from there.  On TSH in February can make other changes on meds but I think it is reasonable to remain on same dose of Synthroid for now.  Hoping that the steroid injection will help the spinal stenosis symptoms.  Hang in there and let me know if there are questions.

## 2023-12-08 NOTE — Progress Notes (Signed)
Subjective:  Patient ID: [name], female    DOB: [birth year]  Age: 63 y.o. MRN: [id number]  CC:  Chief Complaint  Patient presents with   Results    Pt here to discuss the results of her recent results, started spinal [place] and noted a cyst on the kidney then ordered Renal US which is scheduled next week, notes not sure how to follow up considering order came from Ortho     HPI [name] presents for concerns above  Hypothyroidism: Lab Results  Component Value Date   TSH 6.06 (H) 11/24/2023  Synthroid 88 mcg daily, same dose for some time.  Recent slight elevated TSH. Taking medication daily.  No symptoms.  No new hot or cold intolerance. No new hair or skin changes, heart palpitations or new fatigue. No new weight changes. Plan for repeat tsh on 2/6 to decide on changes.   Renal cyst MRI lumbar spine ordered by Dr. Christell Constant on 11/22/2023, indicating severe spinal canal stenosis at L4-5.  Incidental finding of 11 mm T2 hyperintense lesion along the posterior aspect of left kidney with indistinct origins, most likely representing simple cyst but further evaluation with dedicated renal ultrasound recommended.  That exam has been ordered, scheduled on January 30. Hx of tuberous sclerosis. Has seen nephrology at [facility]  - Dr. Ricci Barker - over 10 years since last visit.  No hematuria No dysuria/urgency or pain with urination. No abdominal pain.   Prior ultrasound 04/13/12 - 1.6x2.6x2.2 hyperechoic lesion at that time.   Planned ESI for spine tomorrow.     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   Anemia    Back pain    Blood transfusion without reported diagnosis    post-operatively in 20s after L1 fracture with spinal cord injury after horseback riding.   Chronic kidney disease    Tuberous Sclerosis; [facility] at [facility]   Edema of both lower extremities    Hyperlipidemia    Hypertension    Hypothyroidism    Lower back pain    Pre-diabetes    Thyroid disease    Vitamin D deficiency    Past Surgical History:  Procedure Laterality Date   ABDOMINAL HYSTERECTOMY     DUB; ovaries remaining.   CESAREAN SECTION     MENISCUS REPAIR     Sleep Study  12/17/2010   No sleep disordered breathing or periodic limb mvmets.  Excess light sleep.   SPINE SURGERY     L1 fracture with spinal cord injury with horseback riding.   TOTAL VAGINAL HYSTERECTOMY  2008   TUBAL LIGATION     Allergies  Allergen Reactions   Saxenda [Liraglutide -Weight Management] Nausea And Vomiting    With borderline high lipase - concern for possible pancreatitis.    Sulfa Antibiotics Hives   Lipitor [Atorvastatin] Other (See Comments)    Fatigue and confusion   Prior to Admission medications   Medication  Sig Start Date End Date Taking? Authorizing Provider  enalapril (VASOTEC) 20 MG tablet Take 1 tablet (20 mg total) by mouth daily. 04/28/23  Yes [name], MD  levothyroxine (SYNTHROID) 88 MCG tablet Take 1 tablet (88 mcg total) by mouth daily. 04/28/23  Yes [name], MD  meloxicam (MOBIC) 7.5 MG tablet Take 1 tablet (7.5 mg total) by mouth daily as needed for pain. 05/11/23  Yes Cristie Hem, PA-C  metFORMIN (GLUCOPHAGE-XR) 750 MG 24 hr tablet 1 tab with first meal and 1 tab with dinner 12/07/23  Yes Danford, Orpha Bur D, NP  methylPREDNISolone (MEDROL DOSEPAK) 4 MG TBPK tablet Take as prescribed on the box 10/27/23   Yes London Sheer, MD  pregabalin (LYRICA) 75 MG capsule Take 1 capsule (75 mg total) by mouth 2 (two) times daily. 11/26/23 02/24/24 Yes London Sheer, MD  Vitamin D, Ergocalciferol, (DRISDOL) 1.25 MG (50000 UNIT) CAPS capsule Take 1 capsule (50,000 Units total) by mouth every 7 (seven) days. 11/08/23  Yes Danford, Jinny Blossom, NP   Social History   Socioeconomic History   Marital status: Married    Spouse name: Burnis Kingfisher   Number of children: Not on file   Years of education: Not on file   Highest education level: Master's degree (e.g., MA, MS, MEng, MEd, MSW, MBA)  Occupational History   Occupation: Tourist information centre manager  Tobacco Use   Smoking status: Never   Smokeless tobacco: Never  Vaping Use   Vaping status: Never Used  Substance and Sexual Activity   Alcohol use: Yes    Comment: occ   Drug use: No   Sexual activity: Yes    Birth control/protection: None  Other Topics Concern   Not on file  Social History Narrative   Marital status: married x 20+ years; happily married.      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      Tobacco; none      Alcohol:  Socially      Drugs: none      Exercise: sporadic   Social Drivers of Corporate investment banker Strain: Low Risk  (12/07/2023)   Overall Financial Resource Strain (CARDIA)    Difficulty of Paying Living Expenses: Not hard at all  Food Insecurity: No Food Insecurity (12/07/2023)   Hunger Vital Sign    Worried About Running Out of Food in the Last Year: Never true    Ran Out of Food in the Last Year: Never true  Transportation Needs: No Transportation Needs (12/07/2023)   PRAPARE - Administrator, Civil Service (Medical): No    Lack of Transportation (Non-Medical): No  Physical Activity: Inactive (12/07/2023)   Exercise Vital Sign    Days of Exercise per Week: 0 days    Minutes of Exercise per Session: 30 min  Stress: No  Stress Concern Present (12/07/2023)   [employer] of Occupational Health - Occupational Stress Questionnaire    Feeling of Stress : Not at all  Social Connections: Moderately Integrated (12/07/2023)   Social Connection and Isolation Panel [NHANES]    Frequency of Communication with Friends and Family: More than three times a week    Frequency of Social Gatherings with Friends and Family: Three times a week    Attends Religious Services: Never    Active Member of Clubs or Organizations: Yes  Attends Banker Meetings: 1 to 4 times per year    Marital Status: Married  Catering manager Violence: Not on file    Review of Systems  Per HPI.  Objective:   Vitals:   12/08/23 0924  BP: 126/70  Pulse: 72  Temp: 98.2 F (36.8 C)  TempSrc: Temporal  SpO2: 98%  Weight: 180 lb 12.8 oz (82 kg)    Physical Exam Constitutional:      Appearance: She is well-developed.  HENT:     Head: Normocephalic and atraumatic.     Right Ear: External ear normal.     Left Ear: External ear normal.  Eyes:     Conjunctiva/sclera: Conjunctivae normal.     Pupils: Pupils are equal, round, and reactive to light.  Neck:     Thyroid: No thyromegaly.  Cardiovascular:     Rate and Rhythm: Normal rate and regular rhythm.     Heart sounds: Normal heart sounds. No murmur heard. Pulmonary:     Effort: Pulmonary effort is normal. No respiratory distress.     Breath sounds: Normal breath sounds. No wheezing.  Abdominal:     General: Bowel sounds are normal.     Palpations: Abdomen is soft.     Tenderness: There is no abdominal tenderness. There is no right CVA tenderness or left CVA tenderness.  Musculoskeletal:        General: No tenderness. Normal range of motion.     Cervical back: Normal range of motion and neck supple.  Lymphadenopathy:     Cervical: No cervical adenopathy.  Skin:    General: Skin is warm and dry.     Findings: No rash.  Neurological:     Mental Status: She is  alert and oriented to person, place, and time.  Psychiatric:        Behavior: Behavior normal.        Thought Content: Thought content normal.     Assessment & Plan:  [name] is a 63 y.o. female . Renal cyst, left  -Ultrasound plan as above in 8 days.  Depending on results could consider follow-up with her previous nephrologist, history of tuberosclerosis.  Asymptomatic.  Advised to let me know if there are questions if that ultrasound result is provided by Ortho, and happy to refer her to her nephrologist without office visit if needed.  Elevated TSH  -Plan for TSH in few weeks and adjust plan accordingly at that time, asymptomatic at current dose.  Continue follow-up with orthopedics for planned epidural spinal injection and pain.  Improving.  No orders of the defined types were placed in this encounter.  Patient Instructions  Thanks for coming in today.  I suspect that the renal ultrasound will be reassuring and probable simple cyst but if any concerns we can certainly refer you back to your nephrologist at [facility].  I did compare to the previous ultrasound in 2013 and there was a lesion that appears to have been larger back then.  Again we can see what the ultrasound shows and go from there.  On TSH in February can make other changes on meds but I think it is reasonable to remain on same dose of Synthroid for now.  Hoping that the steroid injection will help the spinal stenosis symptoms.  Hang in there and let me know if there are questions.    Signed,   Meredith Staggers, MD [facility], [facility] 12/08/23 10:04 AM

## 2023-12-09 ENCOUNTER — Encounter: Payer: 59 | Admitting: Physical Medicine and Rehabilitation

## 2023-12-09 ENCOUNTER — Encounter: Payer: 59 | Admitting: Orthopedic Surgery

## 2023-12-09 ENCOUNTER — Telehealth: Payer: Self-pay | Admitting: Physical Medicine and Rehabilitation

## 2023-12-09 NOTE — Telephone Encounter (Signed)
Patient called needing to R/S her appointment with Dr. Alvester Morin. The number to contact patient is [phone]

## 2023-12-16 ENCOUNTER — Ambulatory Visit
Admission: RE | Admit: 2023-12-16 | Discharge: 2023-12-16 | Disposition: A | Discharge status: Home / Self Care | Payer: 59 | Source: Ambulatory Visit | Attending: Orthopedic Surgery | Admitting: Orthopedic Surgery

## 2023-12-16 DIAGNOSIS — N281 Cyst of kidney, acquired: Secondary | ICD-10-CM

## 2023-12-30 ENCOUNTER — Other Ambulatory Visit: Payer: Self-pay | Admitting: Orthopedic Surgery

## 2023-12-30 ENCOUNTER — Other Ambulatory Visit: Payer: 59

## 2023-12-30 DIAGNOSIS — E039 Hypothyroidism, unspecified: Secondary | ICD-10-CM | POA: Diagnosis not present

## 2023-12-30 LAB — TSH: TSH: 1.2 u[IU]/mL (ref 0.35–5.50)

## 2024-01-04 ENCOUNTER — Encounter (INDEPENDENT_AMBULATORY_CARE_PROVIDER_SITE_OTHER): Payer: Self-pay | Admitting: Adult Health

## 2024-01-04 ENCOUNTER — Encounter: Payer: Self-pay | Admitting: Family Medicine

## 2024-01-04 ENCOUNTER — Ambulatory Visit (INDEPENDENT_AMBULATORY_CARE_PROVIDER_SITE_OTHER): Payer: 59 | Admitting: Adult Health

## 2024-01-04 VITALS — BP 147/90 | HR 82 | Temp 98.6°F | Ht 60.0 in | Wt 174.0 lb

## 2024-01-04 DIAGNOSIS — I10 Essential (primary) hypertension: Secondary | ICD-10-CM

## 2024-01-04 DIAGNOSIS — E66811 Obesity, class 1: Secondary | ICD-10-CM

## 2024-01-04 DIAGNOSIS — R7303 Prediabetes: Secondary | ICD-10-CM

## 2024-01-04 DIAGNOSIS — E669 Obesity, unspecified: Secondary | ICD-10-CM

## 2024-01-04 DIAGNOSIS — E559 Vitamin D deficiency, unspecified: Secondary | ICD-10-CM | POA: Diagnosis not present

## 2024-01-04 DIAGNOSIS — Z6834 Body mass index (BMI) 34.0-34.9, adult: Secondary | ICD-10-CM

## 2024-01-04 DIAGNOSIS — E66812 Obesity, class 2: Secondary | ICD-10-CM

## 2024-01-04 DIAGNOSIS — E039 Hypothyroidism, unspecified: Secondary | ICD-10-CM | POA: Diagnosis not present

## 2024-01-04 MED ORDER — VITAMIN D (ERGOCALCIFEROL) 1.25 MG (50000 UNIT) PO CAPS: 50000.0000 [IU] | ORAL_CAPSULE | ORAL | 0 refills | Status: DC

## 2024-01-04 MED ORDER — METFORMIN HCL ER 750 MG PO TB24
ORAL_TABLET | ORAL | 0 refills | Status: DC
Start: 2024-01-04 — End: 2024-02-03

## 2024-01-04 NOTE — Progress Notes (Signed)
WEIGHT SUMMARY AND BIOMETRICS  Vitals Temp: 98.6 F (37 C) BP: (!) 147/90 Pulse Rate: 82 SpO2: 99 %   Anthropometric Measurements Height: 5' (1.524 m) Weight: 174 lb (78.9 kg) BMI (Calculated): 33.98 Weight at Last Visit: 174 lb Weight Lost Since Last Visit: 0 Weight Gained Since Last Visit: 0 Starting Weight: 177 lb Total Weight Loss (lbs): 3 lb (1.361 kg)   Body Composition  Body Fat %: 41.1 % Fat Mass (lbs): 71.8 lbs Muscle Mass (lbs): 97.6 lbs Total Body Water (lbs): 70 lbs Visceral Fat Rating : 12   Other Clinical Data Fasting: no Labs: no Today's Visit #: 24 Starting Date: 04/03/20    Chief Complaint:   OBESITY [name] is here to discuss her progress with her obesity treatment plan.  She is on the keeping a food journal and adhering to recommended goals of 1200 calories and 90g+ protein and states she is following her eating plan approximately 75 % of the time.  She states she is exercising Swimming/Walking/Exercise Bike 30 minutes 4-5 times per week.   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  [name] has made a very concerted effort to increase regular exercise. She has resumed swimming Mon/Wed/Fri She will either walk or ride her stationary bike on Tues/Thurs  She states "I just feel better".  Subjective:   1. Hypothyroidism, unspecified type  Latest Reference Range & Units 11/24/23 09:47 12/30/23 13:21  TSH 0.35 - 5.50 uIU/mL 6.06 (H) 1.20  (H): Data is abnormally high  TSH repeat is normal- OV with PCP She was continued on Levothyroxine She denies hot or cold intolerance. No new hair or skin changes, heart palpitations or new fatigue. No new weight changes.  2. Essential hypertension BP above goal at OV She denies CP or palpitations She received upsetting phone call in regards to a family member.  3. Vitamin D deficiency  Latest  Reference Range & Units 11/08/23 09:15  Vitamin D, 25-Hydroxy 30.0 - 100.0 ng/mL 50.7   She is on weekly Ergocalciferol- denies N/V/Muscle Weakness  6. Pre-diabetes Lab Results  Component Value Date   HGBA1C 5.9 (H) 11/08/2023   HGBA1C 5.9 (H) 05/04/2023   HGBA1C 5.6 12/16/2022    She is currently taking Metformin XR 750mg  - 1 tab with first meal and 1 tab with second meal She denies GI upset She reports breakthrough polyphagia at 1400- she believes on those days she has not consumed adequate protein at lunch.  Assessment/Plan:   1. Hypothyroidism, unspecified type Monitor labs  Monitor for sx's   2. Essential hypertension Limit Na+ Continue regular exercise Continue   3. Vitamin D deficiency Refill - Vitamin D, Ergocalciferol, (DRISDOL) 1.25 MG (50000 UNIT) CAPS capsule; Take 1 capsule (50,000 Units total) by mouth every 7 (seven) days.  Dispense: 8 capsule; Refill: 0  6. Pre-diabetes Refill - metFORMIN (GLUCOPHAGE-XR) 750 MG 24 hr tablet; 1 tab with first meal and 1 tab with dinner  Dispense: 60 tablet; Refill: 0  4. Obesity with current BMI 34.1 (Primary)  [name] is currently in the action stage of change. As such, her goal is to continue with weight loss efforts. She has agreed to keeping a food journal and adhering to recommended goals of 1200 calories and 90g+ protein.   Exercise goals: For substantial health benefits, adults should do at least 150 minutes (2 hours and 30 minutes) a week of  moderate-intensity, or 75 minutes (1 hour and 15 minutes) a week of vigorous-intensity aerobic physical activity, or an equivalent combination of moderate- and vigorous-intensity aerobic activity. Aerobic activity should be performed in episodes of at least 10 minutes, and preferably, it should be spread throughout the week.  Behavioral modification strategies: increasing lean protein intake, decreasing simple carbohydrates, increasing vegetables, increasing water intake, no  skipping meals, meal planning and cooking strategies, keeping healthy foods in the home, ways to avoid boredom eating, and planning for success.  [name] has agreed to follow-up with our clinic in 4 weeks. She was informed of the importance of frequent follow-up visits to maximize her success with intensive lifestyle modifications for her multiple health conditions.   Objective:   Blood pressure (!) 147/90, pulse 82, temperature 98.6 F (37 C), height 5' (1.524 m), weight 174 lb (78.9 kg), SpO2 99%. Body mass index is 33.98 kg/m.  General: Cooperative, alert, well developed, in no acute distress. HEENT: Conjunctivae and lids unremarkable. Cardiovascular: Regular rhythm.  Lungs: Normal work of breathing. Neurologic: No focal deficits.   Lab Results  Component Value Date   CREATININE 0.78 11/08/2023   BUN 18 11/08/2023   NA 139 11/08/2023   K 4.2 11/08/2023   CL 105 11/08/2023   CO2 21 11/08/2023   Lab Results  Component Value Date   ALT 17 11/08/2023   AST 15 11/08/2023   ALKPHOS 74 11/08/2023   BILITOT 0.3 11/08/2023   Lab Results  Component Value Date   HGBA1C 5.9 (H) 11/08/2023   HGBA1C 5.9 (H) 05/04/2023   HGBA1C 5.6 12/16/2022   HGBA1C 5.6 09/09/2022   HGBA1C 5.6 04/20/2022   Lab Results  Component Value Date   INSULIN 14.8 11/08/2023   INSULIN 8.8 05/04/2023   INSULIN 6.5 12/16/2022   INSULIN 6.5 09/09/2022   INSULIN 7.9 04/20/2022   Lab Results  Component Value Date   TSH 1.20 12/30/2023   Lab Results  Component Value Date   CHOL 259 (H) 09/09/2022   HDL 62 09/09/2022   LDLCALC 145 (H) 09/09/2022   TRIG 287 (H) 09/09/2022   CHOLHDL 4.2 09/09/2022   Lab Results  Component Value Date   VD25OH 50.7 11/08/2023   VD25OH 40.7 05/04/2023   VD25OH 37.9 12/16/2022   Lab Results  Component Value Date   WBC 6.6 09/09/2022   HGB 15.0 09/09/2022   HCT 43.8 09/09/2022   MCV 97 09/09/2022   PLT 354 09/09/2022   No results found for: "IRON", "TIBC",  "FERRITIN"  Attestation Statements:   Reviewed by clinician on day of visit: allergies, medications, problem list, medical history, surgical history, family history, social history, and previous encounter notes.  I have reviewed the above documentation for accuracy and completeness, and I agree with the above. -  Pervis Macintyre d. Yesika Rispoli, NP-C

## 2024-01-12 ENCOUNTER — Telehealth: Payer: Self-pay | Admitting: Physical Medicine and Rehabilitation

## 2024-01-13 ENCOUNTER — Encounter: Payer: 59 | Admitting: Physical Medicine and Rehabilitation

## 2024-01-24 ENCOUNTER — Ambulatory Visit: Payer: 59 | Admitting: Physical Medicine and Rehabilitation

## 2024-01-24 ENCOUNTER — Other Ambulatory Visit: Payer: Self-pay

## 2024-01-24 ENCOUNTER — Other Ambulatory Visit: Payer: Self-pay | Admitting: Orthopedic Surgery

## 2024-01-24 VITALS — BP 129/78 | HR 64

## 2024-01-24 DIAGNOSIS — M5416 Radiculopathy, lumbar region: Secondary | ICD-10-CM

## 2024-01-24 MED ORDER — METHYLPREDNISOLONE ACETATE 40 MG/ML IJ SUSP
40.0000 mg | Freq: Once | INTRAMUSCULAR | Status: AC
  Administered 2024-01-24: 40 mg

## 2024-01-24 MED ORDER — PREGABALIN 75 MG PO CAPS: 75.0000 mg | ORAL_CAPSULE | Freq: Two times a day (BID) | ORAL | 0 refills | Status: DC

## 2024-01-24 NOTE — Patient Instructions (Signed)

## 2024-01-24 NOTE — Progress Notes (Signed)
 Pain Scale   Average Pain 5        +Driver, -BT, -Dye Allergies.

## 2024-02-01 NOTE — Procedures (Signed)
 Lumbar Epidural Steroid Injection - Interlaminar Approach with Fluoroscopic Guidance  Patient: [name]      Date of Birth: 12/13/1960 MRN: [id number] PCP: [name], MD      Visit Date: 01/24/2024   Universal Protocol:     Consent Given By: the patient  Position: PRONE  Additional Comments: Vital signs were monitored before and after the procedure. Patient was prepped and draped in the usual sterile fashion. The correct patient, procedure, and site was verified.   Injection Procedure Details:   Procedure diagnoses: Radiculopathy, lumbar region [M54.16]   Meds Administered:  Meds ordered this encounter  Medications   methylPREDNISolone acetate (DEPO-MEDROL) injection 40 mg     Laterality: Right  Location/Site:  L4-5  Needle: 3.5 in., 20 ga. Tuohy  Needle Placement: Paramedian epidural  Findings:   -Comments: Excellent flow of contrast into the epidural space.  Procedure Details: Using a paramedian approach from the side mentioned above, the region overlying the inferior lamina was localized under fluoroscopic visualization and the soft tissues overlying this structure were infiltrated with 4 ml. of 1% Lidocaine without Epinephrine. The Tuohy needle was inserted into the epidural space using a paramedian approach.   The epidural space was localized using loss of resistance along with counter oblique bi-planar fluoroscopic views.  After negative aspirate for air, blood, and CSF, a 2 ml. volume of Isovue-250 was injected into the epidural space and the flow of contrast was observed. Radiographs were obtained for documentation purposes.    The injectate was administered into the level noted above.   Additional Comments:  No complications occurred Dressing: 2 x 2 sterile gauze and Band-Aid    Post-procedure details: Patient was observed during the procedure. Post-procedure instructions were reviewed.  Patient left the clinic in stable condition.

## 2024-02-01 NOTE — Progress Notes (Signed)
 [name] - 63 y.o. female MRN [id number]  Date of birth: 1961-10-22  Office Visit Note: Visit Date: 01/24/2024 PCP: [name], MD Referred by: London Sheer, MD  Subjective: Chief Complaint  Patient presents with   Lower Back - Pain   HPI:  [name] is a 63 y.o. female who comes in today at the request of Dr. Willia Craze for planned Right L4-5 Lumbar Interlaminar epidural steroid injection with fluoroscopic guidance.  The patient has failed conservative care including home exercise, medications, time and activity modification.  This injection will be diagnostic and hopefully therapeutic.  Please see requesting physician notes for further details and justification.   ROS Otherwise per HPI.  Assessment & Plan: Visit Diagnoses:    ICD-10-CM   1. Radiculopathy, lumbar region  M54.16 XR C-ARM NO REPORT    Epidural Steroid injection    methylPREDNISolone acetate (DEPO-MEDROL) injection 40 mg      Plan: No additional findings.   Meds & Orders:  Meds ordered this encounter  Medications   methylPREDNISolone acetate (DEPO-MEDROL) injection 40 mg    Orders Placed This Encounter  Procedures   XR C-ARM NO REPORT   Epidural Steroid injection    Follow-up: No follow-ups on file.   Procedures: No procedures performed  Lumbar Epidural Steroid Injection - Interlaminar Approach with Fluoroscopic Guidance  Patient: [name]      Date of Birth: [birth year] MRN: [id number] PCP: [name], MD      Visit Date: 01/24/2024   Universal Protocol:     Consent Given By: the patient  Position: PRONE  Additional Comments: Vital signs were monitored before and after the procedure. Patient was prepped and draped in the usual sterile fashion. The correct patient, procedure, and site was verified.   Injection Procedure Details:   Procedure diagnoses: Radiculopathy, lumbar region [M54.16]   Meds Administered:  Meds ordered this encounter   Medications   methylPREDNISolone acetate (DEPO-MEDROL) injection 40 mg     Laterality: Right  Location/Site:  L4-5  Needle: 3.5 in., 20 ga. Tuohy  Needle Placement: Paramedian epidural  Findings:   -Comments: Excellent flow of contrast into the epidural space.  Procedure Details: Using a paramedian approach from the side mentioned above, the region overlying the inferior lamina was localized under fluoroscopic visualization and the soft tissues overlying this structure were infiltrated with 4 ml. of 1% Lidocaine without Epinephrine. The Tuohy needle was inserted into the epidural space using a paramedian approach.   The epidural space was localized using loss of resistance along with counter oblique bi-planar fluoroscopic views.  After negative aspirate for air, blood, and CSF, a 2 ml. volume of Isovue-250 was injected into the epidural space and the flow of contrast was observed. Radiographs were obtained for documentation purposes.    The injectate was administered into the level noted above.   Additional Comments:  No complications occurred Dressing: 2 x 2 sterile gauze and Band-Aid    Post-procedure details: Patient was observed during the procedure. Post-procedure instructions were reviewed.  Patient left the clinic in stable condition.   Clinical History: MRI LUMBAR SPINE WITHOUT CONTRAST   TECHNIQUE: Multiplanar, multisequence MR imaging of the lumbar spine was performed. No intravenous contrast was administered.   COMPARISON:  MR L Spine 11/22/12   FINDINGS: Segmentation:  Standard.   Alignment:  Grade 1 anterolisthesis of L4 on L5.   Vertebrae: No fracture, evidence of discitis, or bone lesion. There are fusion rods  spanning T12-L3   Conus medullaris and cauda equina: Conus extends to the L1-L2 disc space level. Conus and cauda equina appear normal.   Paraspinal and other soft tissues: There is a 11 mm T2 hyperintense lesion along the posterior aspect  of the left kidney with somewhat indistinct origins (series 105, image 6). This most likely represents a renal cyst, but further evaluation with a dedicated renal ultrasound is recommended for more definitive characterization.   Disc levels:   Detailed characterization of the degree of degenerative change is slightly limited due to susceptibility artifact from metallic fusion hardware   T12-L1: No evidence of spinal canal or neural foraminal narrowing. No significant disc bulge.   L1-L2: No evidence of spinal canal narrowing. No significant disc bulge. No neural foraminal narrowing.   L2-L3: Incompletely assessed due to the degree of susceptibility artifact. No evidence of neural foraminal narrowing.   L3-L4: Incompletely assessed due to the degree of susceptibility artifact.   L4-L5: Severe bilateral facet degenerative change. Circumferential disc bulge. Severe spinal canal stenosis. Moderate to severe left and moderate right neural foraminal narrowing.   L5-S1: Severe left and moderate right facet degenerative change. Eccentric right disc bulge. Ligamentum flavum hypertrophy. No significant spinal canal narrowing. Moderate to severe right and moderate left neural foraminal narrowing.   IMPRESSION: Detailed characterization of the degree of degenerative change is slightly limited due to susceptibility artifact from metallic fusion hardware   1. Severe spinal canal stenosis at L4-L5 secondary to a combination of a disc bulge and ligamentum flavum hypertrophy. This has slightly progressed compared to 11/22/12 2. Moderate to severe neural foraminal narrowing at L4-L5 (left) and L5-S1 (right). This has progressed from 11/22/12. 3. There is an 11 mm T2 hyperintense lesion along the posterior aspect of the left kidney with somewhat indistinct origins. This most likely represents a simple renal cyst, but further evaluation with a dedicated renal ultrasound is recommended for more  definitive characterization.     Electronically Signed   By: Lorenza Cambridge M.D.   On: 12/01/2023 07:00     Objective:  VS:  HT:    WT:   BMI:     BP:129/78  HR:64bpm  TEMP: ( )  RESP:  Physical Exam Vitals and nursing note reviewed.  Constitutional:      General: She is not in acute distress.    Appearance: Normal appearance. She is not ill-appearing.  HENT:     Head: Normocephalic and atraumatic.     Right Ear: External ear normal.     Left Ear: External ear normal.  Eyes:     Extraocular Movements: Extraocular movements intact.  Cardiovascular:     Rate and Rhythm: Normal rate.     Pulses: Normal pulses.  Pulmonary:     Effort: Pulmonary effort is normal. No respiratory distress.  Abdominal:     General: There is no distension.     Palpations: Abdomen is soft.  Musculoskeletal:        General: Tenderness present.     Cervical back: Neck supple.     Right lower leg: No edema.     Left lower leg: No edema.     Comments: Patient has good distal strength with no pain over the greater trochanters.  No clonus or focal weakness.  Skin:    Findings: No erythema, lesion or rash.  Neurological:     General: No focal deficit present.     Mental Status: She is alert and oriented to person, place,  and time.     Sensory: No sensory deficit.     Motor: No weakness or abnormal muscle tone.     Coordination: Coordination normal.  Psychiatric:        Mood and Affect: Mood normal.        Behavior: Behavior normal.      Imaging: No results found.

## 2024-02-03 ENCOUNTER — Encounter (INDEPENDENT_AMBULATORY_CARE_PROVIDER_SITE_OTHER): Payer: Self-pay | Admitting: Adult Health

## 2024-02-03 ENCOUNTER — Ambulatory Visit (INDEPENDENT_AMBULATORY_CARE_PROVIDER_SITE_OTHER): Payer: 59 | Admitting: Adult Health

## 2024-02-03 VITALS — BP 121/76 | HR 67 | Temp 97.6°F | Ht 60.0 in | Wt 168.0 lb

## 2024-02-03 DIAGNOSIS — E559 Vitamin D deficiency, unspecified: Secondary | ICD-10-CM | POA: Diagnosis not present

## 2024-02-03 DIAGNOSIS — Z6832 Body mass index (BMI) 32.0-32.9, adult: Secondary | ICD-10-CM

## 2024-02-03 DIAGNOSIS — R0602 Shortness of breath: Secondary | ICD-10-CM

## 2024-02-03 DIAGNOSIS — M5416 Radiculopathy, lumbar region: Secondary | ICD-10-CM

## 2024-02-03 DIAGNOSIS — E66812 Obesity, class 2: Secondary | ICD-10-CM | POA: Diagnosis not present

## 2024-02-03 DIAGNOSIS — E669 Obesity, unspecified: Secondary | ICD-10-CM

## 2024-02-03 DIAGNOSIS — E66811 Obesity, class 1: Secondary | ICD-10-CM

## 2024-02-03 DIAGNOSIS — I10 Essential (primary) hypertension: Secondary | ICD-10-CM

## 2024-02-03 DIAGNOSIS — R7303 Prediabetes: Secondary | ICD-10-CM

## 2024-02-03 MED ORDER — VITAMIN D (ERGOCALCIFEROL) 1.25 MG (50000 UNIT) PO CAPS: 50000.0000 [IU] | ORAL_CAPSULE | ORAL | 0 refills | Status: DC

## 2024-02-03 MED ORDER — METFORMIN HCL ER 750 MG PO TB24: ORAL_TABLET | ORAL | 0 refills | Status: DC

## 2024-02-03 NOTE — Progress Notes (Signed)
 WEIGHT SUMMARY AND BIOMETRICS  Vitals Temp: 97.6 F (36.4 C) BP: 121/76 Pulse Rate: 67 SpO2: 99 %   Anthropometric Measurements Height: 5' (1.524 m) Weight: 168 lb (76.2 kg) BMI (Calculated): 32.81 Weight at Last Visit: 174 lb Weight Lost Since Last Visit: 6 lb Weight Gained Since Last Visit: 0 lb Starting Weight: 177 lb Total Weight Loss (lbs): 9 lb (4.082 kg)   Body Composition  Body Fat %: 39.1 % Fat Mass (lbs): 65.8 lbs Muscle Mass (lbs): 97.4 lbs Total Body Water (lbs): 68.4 lbs Visceral Fat Rating : 11   Other Clinical Data Fasting: no Labs: no Today's Visit #: 25 Starting Date: 04/03/20    Chief Complaint:   OBESITY [name] is here to discuss her progress with her obesity treatment plan.  She is on the keeping a food journal and adhering to recommended goals of 1200 calories and 90g+ protein and states she is following her eating plan approximately 90 % of the time.  She states she is exercising Walking and Swimming 30 minutes 6 times per week.   Interim History:  Current weight 168 lbs Goal weight 150s  Reviewed Bioimpedance results with pt: Muscle Mass: -0.2 lb Adipose Mass: - 6 lbs  Subjective:   1. SOB (shortness of breath) on exertion She endorses dyspnea with extreme exertion  06/11/22 12:00  RMR 1526 [1]  [1] 04/20/2022  Recommend to recheck IC at next OV  2. Radiculopathy, lumbar region 01/24/2024 Phys Meds OV Notes Lumbar Epidural Steroid Injection - Interlaminar Approach with Fluoroscopic Guidance   Patient: [name]                                                     Date of Birth: 07/28/1961 MRN: [id number] PCP: [name], MD                                                   Visit Date: 01/24/2024   Universal Protocol:      Consent Given By: the patient   Position: PRONE   Additional Comments: Vital signs were monitored before and after the procedure. Patient was prepped and draped in the usual  sterile fashion. The correct patient, procedure, and site was verified.     Injection Procedure Details:    Procedure diagnoses: Radiculopathy, lumbar region [M54.16]   3. Vitamin D deficiency  Latest Reference Range & Units 12/16/22 11:58 05/04/23 09:49 11/08/23 09:15  Vitamin D, 25-Hydroxy 30.0 - 100.0 ng/mL 37.9 40.7 50.7   She is on weekly Ergocalciferol- denies N/V/Muscle Weakness  4. Essential hypertension BP excellent at OV She has been steadily increasing activity as tolerated since her Spinal Injection She is on  enalapril (VASOTEC) 20 MG tablet   PCP manages antihypertensive therapy  5. Pre-diabetes Lab Results  Component Value Date   HGBA1C 5.9 (H) 11/08/2023   HGBA1C 5.9 (H) 05/04/2023   HGBA1C 5.6 12/16/2022    She is on Metformin XR 750 BID She endorses increased cravings in late afternoon. She denies GI upset with Metformin Discussed increasing from BID to TID  Assessment/Plan:   1. SOB (shortness of breath) on exertion Check IC at next  OV  2. Radiculopathy, lumbar region F/u with Orthopedics as directed  3. Vitamin D deficiency Refill - Vitamin D, Ergocalciferol, (DRISDOL) 1.25 MG (50000 UNIT) CAPS capsule; Take 1 capsule (50,000 Units total) by mouth every 7 (seven) days.  Dispense: 8 capsule; Refill: 0  4. Essential hypertension Limit Na+ Continue to increase regular exercise as tolerated  5. Pre-diabetes Refill and INCREASE - metFORMIN (GLUCOPHAGE-XR) 750 MG 24 hr tablet; 1 tab three times daily with meals  Dispense: 90 tablet; Refill: 0  6. Obesity with current BMI 32.9 (Primary)  [name] is currently in the action stage of change. As such, her goal is to continue with weight loss efforts. She has agreed to keeping a food journal and adhering to recommended goals of 1200 calories and 90g+ protein.   Exercise goals: For substantial health benefits, adults should do at least 150 minutes (2 hours and 30 minutes) a week of moderate-intensity,  or 75 minutes (1 hour and 15 minutes) a week of vigorous-intensity aerobic physical activity, or an equivalent combination of moderate- and vigorous-intensity aerobic activity. Aerobic activity should be performed in episodes of at least 10 minutes, and preferably, it should be spread throughout the week.  Behavioral modification strategies: increasing lean protein intake, decreasing simple carbohydrates, increasing vegetables, increasing water intake, no skipping meals, meal planning and cooking strategies, keeping healthy foods in the home, ways to avoid boredom eating, and planning for success.  Erricka has agreed to follow-up with our clinic in 4 weeks. She was informed of the importance of frequent follow-up visits to maximize her success with intensive lifestyle modifications for her multiple health conditions.   Check IC at next OV next OV- pt aware to arrive early and to be fasting  Objective:   Blood pressure 121/76, pulse 67, temperature 97.6 F (36.4 C), height 5' (1.524 m), weight 168 lb (76.2 kg), SpO2 99%. Body mass index is 32.81 kg/m.  General: Cooperative, alert, well developed, in no acute distress. HEENT: Conjunctivae and lids unremarkable. Cardiovascular: Regular rhythm.  Lungs: Normal work of breathing. Neurologic: No focal deficits.   Lab Results  Component Value Date   CREATININE 0.78 11/08/2023   BUN 18 11/08/2023   NA 139 11/08/2023   K 4.2 11/08/2023   CL 105 11/08/2023   CO2 21 11/08/2023   Lab Results  Component Value Date   ALT 17 11/08/2023   AST 15 11/08/2023   ALKPHOS 74 11/08/2023   BILITOT 0.3 11/08/2023   Lab Results  Component Value Date   HGBA1C 5.9 (H) 11/08/2023   HGBA1C 5.9 (H) 05/04/2023   HGBA1C 5.6 12/16/2022   HGBA1C 5.6 09/09/2022   HGBA1C 5.6 04/20/2022   Lab Results  Component Value Date   INSULIN 14.8 11/08/2023   INSULIN 8.8 05/04/2023   INSULIN 6.5 12/16/2022   INSULIN 6.5 09/09/2022   INSULIN 7.9 04/20/2022    Lab Results  Component Value Date   TSH 1.20 12/30/2023   Lab Results  Component Value Date   CHOL 259 (H) 09/09/2022   HDL 62 09/09/2022   LDLCALC 145 (H) 09/09/2022   TRIG 287 (H) 09/09/2022   CHOLHDL 4.2 09/09/2022   Lab Results  Component Value Date   VD25OH 50.7 11/08/2023   VD25OH 40.7 05/04/2023   VD25OH 37.9 12/16/2022   Lab Results  Component Value Date   WBC 6.6 09/09/2022   HGB 15.0 09/09/2022   HCT 43.8 09/09/2022   MCV 97 09/09/2022   PLT 354 09/09/2022   No  results found for: "IRON", "TIBC", "FERRITIN"  Attestation Statements:   Reviewed by clinician on day of visit: allergies, medications, problem list, medical history, surgical history, family history, social history, and previous encounter notes.  I have reviewed the above documentation for accuracy and completeness, and I agree with the above. -  Kilyn Maragh d. Landis Cassaro, NP-C

## 2024-02-07 ENCOUNTER — Ambulatory Visit: Admitting: Orthopedic Surgery

## 2024-02-07 DIAGNOSIS — M5416 Radiculopathy, lumbar region: Secondary | ICD-10-CM

## 2024-02-07 MED ORDER — PREGABALIN 75 MG PO CAPS: 75.0000 mg | ORAL_CAPSULE | Freq: Two times a day (BID) | ORAL | 0 refills | Status: DC

## 2024-02-07 NOTE — Progress Notes (Signed)
 Orthopedic Spine Surgery Office Note   Assessment: Patient is a 63 y.o. female with chronic low back pain with new pain that radiates into bilateral posterior lateral thighs. 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    ___________________________________________________________________________     History:   Patient is a 63 y.o. female who presents today for follow up on her lumbar spine.  Patient has been having low back pain that radiates into her bilateral buttock and posterior thighs.  After last visit, she got an injection with Dr. Alvester Morin.  She says that the injection was very helpful.  She says she got about 80% relief with an injection.  She said she has been more active as her pain has been better.  She has an upcoming trip to [place] which she is excited about.  She gets back at the end of April.  She is interested in starting physical therapy after she gets back.  She has not developed any new symptoms since she was last seen in the office.   Treatments tried: Tylenol, meloxicam, medrol dose pak, lyrica, lumbar steroid injection     Physical Exam:   General: no acute distress, appears stated age Neurologic: alert, answering questions appropriately, following commands Respiratory: unlabored breathing on room air, symmetric chest rise Psychiatric: appropriate affect, normal cadence to speech     MSK (spine):   -Strength exam                                                   Left                   Right EHL                              5/5                  5/5 TA                                 5/5                  5/5 GSC                             5/5                  5/5 Knee extension            5/5                  5/5 Hip flexion                    5/5  5/5   -Sensory exam                           Sensation intact to light touch in L2-S1 nerve distributions of bilateral lower extremities   Imaging: XRs of the lumbar spine from 10/27/2023 were previously independently reviewed and interpreted, showing Harrington rods with wiring as well.  Posterior fixation from T11-L3.  Chronic appearing fracture with anterior height loss at L1.  Disc height loss with anterior osteophyte formation at L3/4 and L4/5.  Spondylolisthesis seen at L4/5.  Spondylolisthesis shifts about 2.7 mm between flexion and extension views.  No acute fracture seen.  No dislocation seen.   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 Spondylolisthesis seen L4/5. T2 signal within the left L4/5 facet that measures about 0.22mm. Facet arthropathy at L4/5 and L5/S1. 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      Patient name: [name] Patient MRN: [id number] Date of visit: 02/07/24

## 2024-02-17 ENCOUNTER — Encounter (INDEPENDENT_AMBULATORY_CARE_PROVIDER_SITE_OTHER): Payer: Self-pay

## 2024-02-17 ENCOUNTER — Ambulatory Visit (INDEPENDENT_AMBULATORY_CARE_PROVIDER_SITE_OTHER): Admitting: Family Medicine

## 2024-02-21 ENCOUNTER — Ambulatory Visit (INDEPENDENT_AMBULATORY_CARE_PROVIDER_SITE_OTHER): Admitting: Adult Health

## 2024-02-21 ENCOUNTER — Encounter (INDEPENDENT_AMBULATORY_CARE_PROVIDER_SITE_OTHER): Payer: Self-pay | Admitting: Adult Health

## 2024-02-21 VITALS — BP 110/71 | HR 51 | Temp 97.8°F | Ht 60.0 in | Wt 164.0 lb

## 2024-02-21 DIAGNOSIS — R7303 Prediabetes: Secondary | ICD-10-CM

## 2024-02-21 DIAGNOSIS — M545 Low back pain, unspecified: Secondary | ICD-10-CM

## 2024-02-21 DIAGNOSIS — I10 Essential (primary) hypertension: Secondary | ICD-10-CM

## 2024-02-21 DIAGNOSIS — R0602 Shortness of breath: Secondary | ICD-10-CM

## 2024-02-21 DIAGNOSIS — E66811 Obesity, class 1: Secondary | ICD-10-CM

## 2024-02-21 DIAGNOSIS — E559 Vitamin D deficiency, unspecified: Secondary | ICD-10-CM | POA: Diagnosis not present

## 2024-02-21 DIAGNOSIS — Z6832 Body mass index (BMI) 32.0-32.9, adult: Secondary | ICD-10-CM

## 2024-02-21 DIAGNOSIS — E669 Obesity, unspecified: Secondary | ICD-10-CM

## 2024-02-21 MED ORDER — METFORMIN HCL ER 750 MG PO TB24: ORAL_TABLET | ORAL | 0 refills | Status: DC

## 2024-02-21 MED ORDER — VITAMIN D (ERGOCALCIFEROL) 1.25 MG (50000 UNIT) PO CAPS: 50000.0000 [IU] | ORAL_CAPSULE | ORAL | 0 refills | Status: DC

## 2024-02-21 NOTE — Progress Notes (Signed)
 WEIGHT SUMMARY AND BIOMETRICS  Vitals Temp: 97.8 F (36.6 C) BP: 110/71 Pulse Rate: (!) 51 SpO2: 97 %   Anthropometric Measurements Height: 5' (1.524 m) Weight: 164 lb (74.4 kg) BMI (Calculated): 32.03 Weight at Last Visit: 168lb Weight Lost Since Last Visit: 4lb Weight Gained Since Last Visit: 0lb Starting Weight: 177lb Total Weight Loss (lbs): 13 lb (5.897 kg)   Body Composition  Body Fat %: 40.3 % Fat Mass (lbs): 66.4 lbs Muscle Mass (lbs): 93.2 lbs Total Body Water (lbs): 68.8 lbs Visceral Fat Rating : 11   Other Clinical Data RMR: 1138 Fasting: Yes Labs: No Today's Visit #: 26 Starting Date: 04/03/20    Chief Complaint:   OBESITY [name] is here to discuss her progress with her obesity treatment plan.  She is on the keeping a food journal and adhering to recommended goals of 1200 calories and 90g+ protein and states she is following her eating plan approximately 85 % of the time.  She states she is exercising Swimming 30 minutes 2 times per week.   Interim History:  [name] will embark on 28 day [place] vacation! She will be walking frequently and focused on hydration and lean protein intake during the excursion.  She has recent OV with Deanna Sheer, MD Orthopedic Surgery Surgery likely in the near future.  Subjective:   1. SOB (shortness of breath) She endorses dyspnea with exertion, denies CP.   02/21/24 10:00  RMR 1138   Metabolism slower than expected She is currently following a 1200 cal day MP  2. Pre-diabetes Lab Results  Component Value Date   HGBA1C 5.9 (H) 11/08/2023   HGBA1C 5.9 (H) 05/04/2023   HGBA1C 5.6 12/16/2022     Latest Reference Range & Units 11/08/23 09:15  eGFR >59 mL/min/1.73 85   She is currently taking TID Metformin XR 750 She denies loose stools or abdominal cramping.  3. Vitamin D deficiency  Latest Reference Range & Units 11/08/23 09:15  Vitamin D, 25-Hydroxy 30.0 - 100.0 ng/mL 50.7    She is taking weekly Ergocalciferol- denies N/V/Muscle Weakness  4. Essential hypertension BP excellent and at goal She denies CP with exertion She denies tobacco/vape use She is currently on: enalapril (VASOTEC) 20 MG tablet   5. Low back pain, unspecified back pain laterality, unspecified chronicity, unspecified whether sciatica present Chronic pain recently worsened. 02/07/2024 Ortho OV Notes History:   Patient is a 63 y.o. female who presents today for follow up on her lumbar spine.  Patient has been having low back pain that radiates into her bilateral buttock and posterior thighs.  After last visit, she got an injection with Dr. Alvester Morin.  She says that the injection was very helpful.  She says she got about 80% relief with an injection.  She said she has been more active as her pain has been better.  She has an upcoming trip to [place] which she is excited about.  She gets back at the end of April.  She is interested in starting physical therapy after she gets back.  She has not developed any new symptoms since she was last seen in the office.  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 Spondylolisthesis seen L4/5. T2 signal within the left L4/5 facet that measures about 0.70mm. Facet arthropathy at L4/5 and L5/S1. 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   Assessment/Plan:   1. SOB (shortness of breath) (Primary)  Check IC Convert to CAT 1 MP  2. Pre-diabetes Check Labs and Refill - metFORMIN (GLUCOPHAGE-XR) 750 MG 24 hr tablet; 1 tab three times daily with meals  Dispense: 90 tablet; Refill: 0 - Hemoglobin A1c - Vitamin B12 - Magnesium - Insulin, random  3. Vitamin D deficiency Check Labs and Refill - Vitamin D, Ergocalciferol, (DRISDOL) 1.25 MG (50000 UNIT) CAPS capsule; Take 1 capsule (50,000 Units total) by mouth every 7 (seven) days.  Dispense: 8 capsule; Refill: 0 - VITAMIN D 25  Hydroxy (Vit-D Deficiency, Fractures)  4. Essential hypertension Check Labs - Comprehensive metabolic panel with GFR  5. Low back pain, unspecified back pain laterality, unspecified chronicity, unspecified whether sciatica present Remain as active as tolerated F/u with Orthopedic Surgeon as directed  6. Obesity (BMI 30.0-34.9), CURRENT BMI 32.1  [name] is currently in the action stage of change. As such, her goal is to continue with weight loss efforts. She has agreed to the Category 1 Plan.   Exercise goals: All adults should avoid inactivity. Some physical activity is better than none, and adults who participate in any amount of physical activity gain some health benefits. Adults should also include muscle-strengthening activities that involve all major muscle groups on 2 or more days a week.  Behavioral modification strategies: increasing lean protein intake, decreasing simple carbohydrates, increasing vegetables, increasing water intake, no skipping meals, meal planning and cooking strategies, keeping healthy foods in the home, and planning for success.  [name] has agreed to follow-up with our clinic in 4 weeks. She was informed of the importance of frequent follow-up visits to maximize her success with intensive lifestyle modifications for her multiple health conditions.   [name] was informed we would discuss her lab results at her next visit unless there is a critical issue that needs to be addressed sooner. [name] agreed to keep her next visit at the agreed upon time to discuss these results.  Objective:   Blood pressure 110/71, pulse (!) 51, temperature 97.8 F (36.6 C), height 5' (1.524 m), weight 164 lb (74.4 kg), SpO2 97%. Body mass index is 32.03 kg/m.  General: Cooperative, alert, well developed, in no acute distress. HEENT: Conjunctivae and lids unremarkable. Cardiovascular: Regular rhythm.  Lungs: Normal work of breathing. Neurologic: No focal deficits.   Lab  Results  Component Value Date   CREATININE 0.78 11/08/2023   BUN 18 11/08/2023   NA 139 11/08/2023   K 4.2 11/08/2023   CL 105 11/08/2023   CO2 21 11/08/2023   Lab Results  Component Value Date   ALT 17 11/08/2023   AST 15 11/08/2023   ALKPHOS 74 11/08/2023   BILITOT 0.3 11/08/2023   Lab Results  Component Value Date   HGBA1C 5.9 (H) 11/08/2023   HGBA1C 5.9 (H) 05/04/2023   HGBA1C 5.6 12/16/2022   HGBA1C 5.6 09/09/2022   HGBA1C 5.6 04/20/2022   Lab Results  Component Value Date   INSULIN 14.8 11/08/2023   INSULIN 8.8 05/04/2023   INSULIN 6.5 12/16/2022   INSULIN 6.5 09/09/2022   INSULIN 7.9 04/20/2022   Lab Results  Component Value Date   TSH 1.20 12/30/2023   Lab Results  Component Value Date   CHOL 259 (H) 09/09/2022   HDL 62 09/09/2022   LDLCALC 145 (H) 09/09/2022   TRIG 287 (H) 09/09/2022   CHOLHDL 4.2 09/09/2022   Lab Results  Component Value Date   VD25OH 50.7 11/08/2023   VD25OH 40.7 05/04/2023   VD25OH 37.9 12/16/2022   Lab Results  Component Value Date   WBC 6.6 09/09/2022   HGB 15.0 09/09/2022   HCT 43.8 09/09/2022   MCV 97 09/09/2022   PLT 354 09/09/2022   No results found for: "IRON", "TIBC", "FERRITIN"  Attestation Statements:   Reviewed by clinician on day of visit: allergies, medications, problem list, medical history, surgical history, family history, social history, and previous encounter notes.  I have reviewed the above documentation for accuracy and completeness, and I agree with the above. -  Lajuana Patchell d. Veronika Heard, NP-C

## 2024-02-22 LAB — COMPREHENSIVE METABOLIC PANEL WITH GFR
ALT: 17 IU/L (ref 0–32)
AST: 18 IU/L (ref 0–40)
Albumin: 4.6 g/dL (ref 3.9–4.9)
Alkaline Phosphatase: 59 IU/L (ref 44–121)
BUN/Creatinine Ratio: 23 (ref 12–28)
BUN: 18 mg/dL (ref 8–27)
Bilirubin Total: 0.4 mg/dL (ref 0.0–1.2)
CO2: 21 mmol/L (ref 20–29)
Calcium: 9.6 mg/dL (ref 8.7–10.3)
Chloride: 103 mmol/L (ref 96–106)
Creatinine, Ser: 0.79 mg/dL (ref 0.57–1.00)
Globulin, Total: 1.8 g/dL (ref 1.5–4.5)
Glucose: 86 mg/dL (ref 70–99)
Potassium: 4.4 mmol/L (ref 3.5–5.2)
Sodium: 144 mmol/L (ref 134–144)
Total Protein: 6.4 g/dL (ref 6.0–8.5)
eGFR: 84 mL/min/{1.73_m2} (ref >59)

## 2024-02-22 LAB — VITAMIN B12: Vitamin B-12: 482 pg/mL (ref 232–1245)

## 2024-02-22 LAB — HEMOGLOBIN A1C
Est. average glucose Bld gHb Est-mCnc: 114 mg/dL
Hgb A1c MFr Bld: 5.6 % (ref 4.8–5.6)

## 2024-02-22 LAB — MAGNESIUM: Magnesium: 2.1 mg/dL (ref 1.6–2.3)

## 2024-02-22 LAB — VITAMIN D 25 HYDROXY (VIT D DEFICIENCY, FRACTURES): Vit D, 25-Hydroxy: 81.6 ng/mL (ref 30.0–100.0)

## 2024-02-23 LAB — INSULIN, RANDOM: INSULIN: 5.4 u[IU]/mL (ref 2.6–24.9)

## 2024-02-26 ENCOUNTER — Other Ambulatory Visit (INDEPENDENT_AMBULATORY_CARE_PROVIDER_SITE_OTHER): Payer: Self-pay | Admitting: Adult Health

## 2024-02-26 DIAGNOSIS — R7303 Prediabetes: Secondary | ICD-10-CM

## 2024-03-15 ENCOUNTER — Other Ambulatory Visit: Payer: Self-pay | Admitting: Family Medicine

## 2024-03-15 DIAGNOSIS — I10 Essential (primary) hypertension: Secondary | ICD-10-CM

## 2024-03-28 ENCOUNTER — Ambulatory Visit (INDEPENDENT_AMBULATORY_CARE_PROVIDER_SITE_OTHER): Admitting: Adult Health

## 2024-03-28 ENCOUNTER — Encounter (INDEPENDENT_AMBULATORY_CARE_PROVIDER_SITE_OTHER): Payer: Self-pay | Admitting: Adult Health

## 2024-03-28 VITALS — BP 123/79 | HR 66 | Temp 98.0°F | Ht 60.0 in | Wt 167.0 lb

## 2024-03-28 DIAGNOSIS — I10 Essential (primary) hypertension: Secondary | ICD-10-CM

## 2024-03-28 DIAGNOSIS — Z6832 Body mass index (BMI) 32.0-32.9, adult: Secondary | ICD-10-CM

## 2024-03-28 DIAGNOSIS — R7303 Prediabetes: Secondary | ICD-10-CM

## 2024-03-28 DIAGNOSIS — M5416 Radiculopathy, lumbar region: Secondary | ICD-10-CM | POA: Diagnosis not present

## 2024-03-28 DIAGNOSIS — E66811 Obesity, class 1: Secondary | ICD-10-CM

## 2024-03-28 DIAGNOSIS — E669 Obesity, unspecified: Secondary | ICD-10-CM

## 2024-03-28 DIAGNOSIS — E559 Vitamin D deficiency, unspecified: Secondary | ICD-10-CM

## 2024-03-28 MED ORDER — VITAMIN D (ERGOCALCIFEROL) 1.25 MG (50000 UNIT) PO CAPS: 50000.0000 [IU] | ORAL_CAPSULE | ORAL | 0 refills | Status: DC

## 2024-03-28 MED ORDER — METFORMIN HCL ER 750 MG PO TB24: ORAL_TABLET | ORAL | 0 refills | Status: DC
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
## 2024-04-17 ENCOUNTER — Encounter: Payer: Self-pay | Admitting: Family Medicine

## 2024-04-17 ENCOUNTER — Ambulatory Visit (INDEPENDENT_AMBULATORY_CARE_PROVIDER_SITE_OTHER): Admitting: Family Medicine

## 2024-04-17 VITALS — BP 112/84 | HR 75 | Temp 98.6°F | Ht 60.0 in | Wt 170.0 lb

## 2024-04-17 DIAGNOSIS — R21 Rash and other nonspecific skin eruption: Secondary | ICD-10-CM

## 2024-04-17 DIAGNOSIS — Z1231 Encounter for screening mammogram for malignant neoplasm of breast: Secondary | ICD-10-CM | POA: Diagnosis not present

## 2024-04-17 MED ORDER — CLOTRIMAZOLE-BETAMETHASONE 1-0.05 % EX CREA
1.0000 | TOPICAL_CREAM | Freq: Every day | CUTANEOUS | 0 refills | Status: DC
Start: 2024-04-17 — End: 2024-07-12

## 2024-04-17 NOTE — Patient Instructions (Signed)
 Areas do have an appearance of possible ringworm.  See information below.  I sent the Lotrisone cream to your pharmacy, apply that twice per day until rash resolves and then can use for additional few days if needed.  If it is not itching you can change over to just plain clotrimazole over-the-counter twice per day, and again continue that until the rash has disappeared and used for an additional few days to make sure it is completely treated.  We can recheck that area at your follow-up visit in a few weeks.  Let me know if there are any questions in the meantime.  Body Ringworm Body ringworm is an infection of the skin that often causes a ring-shaped rash. Body ringworm is also called tinea corporis. Body ringworm can affect any part of your skin. This condition is easily spread from person to person (is very contagious). What are the causes? This condition is caused by fungi called dermatophytes. The condition develops when these fungi grow out of control on the skin. You can get this condition if you touch a person or animal that has it. You can also get it if you share any items with an infected person or pet. These include: Clothing, bedding, and towels. Brushes or combs. Gym equipment. Any other object that has the fungus on it. What increases the risk? You are more likely to develop this condition if you: Play sports that involve close physical contact, such as wrestling. Sweat a lot. Live in areas that are hot and humid. Use public showers. Have a weakened disease-fighting system (immune system). What are the signs or symptoms? Symptoms of this condition include: Itchy, raised red spots and bumps. Red scaly patches. A ring-shaped rash. The rash may have: A clear center. Scales or red bumps at its center. Redness near its borders. Dry and scaly skin on or around it. How is this diagnosed? This condition can usually be diagnosed with a skin exam. A skin scraping may be taken from  the affected area and examined under a microscope to see if the fungus is present. How is this treated? This condition may be treated with: An antifungal cream or ointment. An antifungal shampoo. Antifungal medicines. These may be prescribed if your ringworm: Is severe. Keeps coming back or lasts a long time. Follow these instructions at home: Take over-the-counter and prescription medicines only as told by your health care provider. If you were given an antifungal cream or ointment: Use it as told by your health care provider. Wash the infected area and dry it completely before applying the cream or ointment. If you were given an antifungal shampoo: Use it as told by your health care provider. Leave the shampoo on your body for 3-5 minutes before rinsing. While you have a rash: Wear loose clothing to stop clothes from rubbing and irritating it. Wash or change your bed sheets every night. Wash clothes and bed sheets in hot water. Disinfect or throw out items that may be infected. Wash your hands often with soap and water for at least 20 seconds. If soap and water are not available, use hand sanitizer. If your pet has the same infection, take your pet to see a veterinarian for treatment. How is this prevented? Take a bath or shower every day and after every time you work out or play sports. Dry your skin completely after bathing. Wear sandals or shoes in public places and showers. Wash athletic clothes after each use. Do not share personal items with  others. Avoid touching red patches of skin on other people. Avoid touching pets that have bald spots. If you touch an animal that has a bald spot, wash your hands. Contact a health care provider if: Your rash continues to spread after 7 days of treatment. Your rash is not gone in 4 weeks. The area around your rash gets red, warm, tender, and swollen. This information is not intended to replace advice given to you by your health care  provider. Make sure you discuss any questions you have with your health care provider. Document Revised: 04/16/2022 Document Reviewed: 04/16/2022 Elsevier Patient Education  2024 [employer].

## 2024-04-17 NOTE — Progress Notes (Signed)
 Subjective:  Patient ID: [name], female    DOB: December 23, 1960  Age: 63 y.o. MRN: [id number]  CC:  Chief Complaint  Patient presents with   Rash    Right side lower back notices a patch. Noticed this a couple weeks ago, not itchy states she has nerve damage in that area. Not using anything OTC states she had another spot before and it went away.     HPI [name] presents for   Right low back rash Initially noted patches of dry skin - past few weeks, both sides lower back. Small ones on left went away. Larger patch on back still there. Raised now. No pain or itching. Prior back issues with some nerve changes in low back so unsure. No blisters or d/c.  No fever/systemic sx's. No other rash - no oral/genital involvement.   No tx.  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   Anemia    Back pain    Blood transfusion without reported diagnosis    post-operatively in 20s after L1 fracture with spinal cord injury after horseback riding.   Chronic kidney disease    Tuberous Sclerosis; [facility] at [facility].   Edema of both lower extremities    Hyperlipidemia    Hypertension    Hypothyroidism    Lower back pain    Pre-diabetes    Thyroid  disease    Vitamin D  deficiency    Past Surgical History:  Procedure Laterality Date   ABDOMINAL HYSTERECTOMY     DUB; ovaries remaining.   CESAREAN SECTION     MENISCUS REPAIR      Sleep Study  12/17/2010   No sleep disordered breathing or periodic limb mvmets.  Excess light sleep.   SPINE SURGERY     L1 fracture with spinal cord injury with horseback riding.   TOTAL VAGINAL HYSTERECTOMY  2008   TUBAL LIGATION     Allergies  Allergen Reactions   Saxenda  [Liraglutide  -Weight Management] Nausea And Vomiting    With borderline high lipase - concern for possible pancreatitis.    Sulfa Antibiotics Hives   Lipitor [Atorvastatin ] Other (See Comments)    Fatigue and confusion   Prior to Admission medications   Medication Sig Start Date End Date Taking? Authorizing Provider  enalapril  (VASOTEC ) 20 MG tablet TAKE 1 TABLET BY MOUTH EVERY DAY 03/15/24  Yes Benjiman Bras, MD  levothyroxine  (SYNTHROID ) 88 MCG tablet Take 1 tablet (88 mcg total) by mouth daily. 04/28/23  Yes Benjiman Bras, MD  meloxicam  (MOBIC ) 7.5 MG tablet Take 1 tablet (7.5 mg total) by mouth daily as needed for pain. 05/11/23  Yes Sandie Cross, PA-C  metFORMIN  (GLUCOPHAGE -XR) 750 MG 24 hr tablet 1 tab three times daily with meals 03/28/24  Yes Danford, Katy D, NP  pregabalin  (LYRICA ) 75 MG capsule Take 1 capsule (75 mg total) by mouth 2 (two) times daily. 02/23/24  Yes Colette Davies  A, MD  Vitamin D , Ergocalciferol , (DRISDOL ) 1.25 MG (50000 UNIT) CAPS capsule Take 1 capsule (50,000 Units total) by mouth every 14 (fourteen) days. 03/28/24  Yes Danford, Barkley Li, NP   Social History   Socioeconomic History   Marital status: Married    Spouse name: Dallie Duel   Number of children: Not on file   Years of education: Not on file   Highest education level: Master's degree (e.g., MA, MS, MEng, MEd, MSW, MBA)  Occupational History   Occupation: Tourist information centre manager  Tobacco Use   Smoking status: Never   Smokeless tobacco: Never  Vaping Use   Vaping status: Never Used  Substance and Sexual Activity   Alcohol use: Yes    Comment: occ   Drug use: No   Sexual activity: Yes    Birth control/protection: None   Other Topics Concern   Not on file  Social History Narrative   Marital status: married x 20+ years; happily married.      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      Tobacco; none      Alcohol:  Socially      Drugs: none      Exercise: sporadic   Social Drivers of Corporate investment banker Strain: Low Risk  (12/07/2023)   Overall Financial Resource Strain (CARDIA)    Difficulty of Paying Living Expenses: Not hard at all  Food Insecurity: No Food Insecurity (12/07/2023)   Hunger Vital Sign    Worried About Running Out of Food in the Last Year: Never true    Ran Out of Food in the Last Year: Never true  Transportation Needs: No Transportation Needs (12/07/2023)   PRAPARE - Administrator, Civil Service (Medical): No    Lack of Transportation (Non-Medical): No  Physical Activity: Inactive (12/07/2023)   Exercise Vital Sign    Days of Exercise per Week: 0 days    Minutes of Exercise per Session: 30 min  Stress: No Stress Concern Present (12/07/2023)   [employer] of Occupational Health - Occupational Stress Questionnaire    Feeling of Stress : Not at all  Social Connections: Moderately Integrated (12/07/2023)   Social Connection and Isolation Panel [NHANES]    Frequency of Communication with Friends and Family: More than three times a week    Frequency of Social Gatherings with Friends and Family: Three times a week    Attends Religious Services: Never    Active Member of Clubs or Organizations: Yes    Attends Banker Meetings: 1 to 4 times per year    Marital Status: Married  Catering manager Violence: Not on file    Review of Systems   Objective:   Vitals:   04/17/24 1110  BP: 112/84  Pulse: 75  Temp: 98.6 F (37 C)  TempSrc: Oral  SpO2: 97%  Weight: 170 lb (77.1 kg)  Height: 5' (1.524 m)     Physical Exam Vitals reviewed.  Constitutional:       General: She is not in acute distress.    Appearance: Normal appearance. She is well-developed.  HENT:     Head: Normocephalic and atraumatic.  Cardiovascular:     Rate and Rhythm: Normal rate.  Pulmonary:     Effort: Pulmonary effort is normal.  Skin:      Neurological:     Mental Status:  She is alert and oriented to person, place, and time.  Psychiatric:        Mood and Affect: Mood normal.      Assessment & Plan:  [name] is a 63 y.o. female . Rash - Plan: clotrimazole-betamethasone (LOTRISONE) cream  - Suspected tinea corporis.  Lotrisone cream, apply twice daily until resolved.  If no itching can switch to over-the-counter clotrimazole twice daily.  RTC precautions given.  Encounter for screening mammogram for breast cancer - Plan: MM Digital Screening ordered.   Meds ordered this encounter  Medications   clotrimazole-betamethasone (LOTRISONE) cream    Sig: Apply 1 Application topically daily.    Dispense:  30 g    Refill:  0   Patient Instructions  Areas do have an appearance of possible ringworm.  See information below.  I sent the Lotrisone cream to your pharmacy, apply that twice per day until rash resolves and then can use for additional few days if needed.  If it is not itching you can change over to just plain clotrimazole over-the-counter twice per day, and again continue that until the rash has disappeared and used for an additional few days to make sure it is completely treated.  We can recheck that area at your follow-up visit in a few weeks.  Let me know if there are any questions in the meantime.  Body Ringworm Body ringworm is an infection of the skin that often causes a ring-shaped rash. Body ringworm is also called tinea corporis. Body ringworm can affect any part of your skin. This condition is easily spread from person to person (is very contagious). What are the causes? This condition is caused by fungi called dermatophytes. The  condition develops when these fungi grow out of control on the skin. You can get this condition if you touch a person or animal that has it. You can also get it if you share any items with an infected person or pet. These include: Clothing, bedding, and towels. Brushes or combs. Gym equipment. Any other object that has the fungus on it. What increases the risk? You are more likely to develop this condition if you: Play sports that involve close physical contact, such as wrestling. Sweat a lot. Live in areas that are hot and humid. Use public showers. Have a weakened disease-fighting system (immune system). What are the signs or symptoms? Symptoms of this condition include: Itchy, raised red spots and bumps. Red scaly patches. A ring-shaped rash. The rash may have: A clear center. Scales or red bumps at its center. Redness near its borders. Dry and scaly skin on or around it. How is this diagnosed? This condition can usually be diagnosed with a skin exam. A skin scraping may be taken from the affected area and examined under a microscope to see if the fungus is present. How is this treated? This condition may be treated with: An antifungal cream or ointment. An antifungal shampoo. Antifungal medicines. These may be prescribed if your ringworm: Is severe. Keeps coming back or lasts a long time. Follow these instructions at home: Take over-the-counter and prescription medicines only as told by your health care provider. If you were given an antifungal cream or ointment: Use it as told by your health care provider. Wash the infected area and dry it completely before applying the cream or ointment. If you were given an antifungal shampoo: Use it as told by your health care provider. Leave the shampoo on your body for 3-5 minutes  before rinsing. While you have a rash: Wear loose clothing to stop clothes from rubbing and irritating it. Wash or change your bed sheets every  night. Wash clothes and bed sheets in hot water. Disinfect or throw out items that may be infected. Wash your hands often with soap and water for at least 20 seconds. If soap and water are not available, use hand sanitizer. If your pet has the same infection, take your pet to see a veterinarian for treatment. How is this prevented? Take a bath or shower every day and after every time you work out or play sports. Dry your skin completely after bathing. Wear sandals or shoes in public places and showers. Wash athletic clothes after each use. Do not share personal items with others. Avoid touching red patches of skin on other people. Avoid touching pets that have bald spots. If you touch an animal that has a bald spot, wash your hands. Contact a health care provider if: Your rash continues to spread after 7 days of treatment. Your rash is not gone in 4 weeks. The area around your rash gets red, warm, tender, and swollen. This information is not intended to replace advice given to you by your health care provider. Make sure you discuss any questions you have with your health care provider. Document Revised: 04/16/2022 Document Reviewed: 04/16/2022 Elsevier Patient Education  2024 Elsevier Inc.    Signed,   Caro Christmas, MD [facility] 04/17/24 11:28 AM

## 2024-05-04 ENCOUNTER — Ambulatory Visit: Admitting: Orthopedic Surgery

## 2024-05-04 DIAGNOSIS — M5416 Radiculopathy, lumbar region: Secondary | ICD-10-CM

## 2024-05-04 NOTE — Progress Notes (Signed)
 Orthopedic Spine Surgery Office Note   Assessment: Patient is a 63 y.o. female with chronic low back pain with new pain that radiates into bilateral posterior lateral thighs. 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  After discussion, patient elected to proceed -Patient will next be seen at date of surgery     The patient has symptoms consistent with lumbar radiculopathy. The patient's symptoms were not improving with conservative treatment so operative management was discussed in the form of L4/5 laminectomy. The risks including but not limited to iatrogenic instability, dural tear, nerve root injury, paralysis, persistent pain, infection, bleeding, heart attack, death, stroke, fracture, dvt/pe, and need for additional procedures were discussed with the patient. The benefit of the surgery would be improvement in the patient's radiating leg pain. I explained that back pain relief is not the goal of the surgery and it is not reliably alleviated with this surgery. The alternatives to surgical management were covered with the patient and included continued monitoring, physical therapy, over-the-counter pain medications, ambulatory aids, and activity modification. All the patient's questions were answered to her satisfaction. After this discussion, the patient expressed understanding and elected to proceed with surgical intervention.      ___________________________________________________________________________     History:   Patient is a 63 y.o. female who presents today for follow up on her lumbar spine.  Patient has a history of chronic low back pain but has developed pain  radiating into her bilateral buttock and posterior thighs.  She has tried multiple conservative treatments for this but has not noticed any lasting relief with any of those treatments.  She has not developed any new symptoms since she was last seen in the office.  She is frustrated though because nothing has made her pain better for longer than a week.  She found the injection the most helpful.  She said the physical therapy has been painful and usually aggravates her symptoms.  It has not provided her with any relief.  Treatments tried: PT, Tylenol, meloxicam , medrol  dose pak, lyrica , lumbar steroid injection     Physical Exam:   General: no acute distress, appears stated age Neurologic: alert, answering questions appropriately, following commands Respiratory: unlabored breathing on room air, symmetric chest rise Psychiatric: appropriate affect, normal cadence to speech     MSK (spine):   -Strength exam                                                   Left                  Right EHL                              5/5                  5/5 TA  5/5                  5/5 GSC                             5/5                  5/5 Knee extension            5/5                  5/5 Hip flexion                    5/5                  5/5   -Sensory exam                           Sensation intact to light touch in L2-S1 nerve distributions of bilateral lower extremities   Imaging: XRs of the lumbar spine from 10/27/2023 were previously independently reviewed and interpreted, showing Harrington rods with wiring as well.  Posterior fixation from T11-L3.  Chronic appearing fracture with anterior height loss at L1.  Disc height loss with anterior osteophyte formation at L3/4 and L4/5.  Spondylolisthesis seen at L4/5.  Spondylolisthesis shifts about 2.7 mm between flexion and extension views.  No acute fracture seen.  No dislocation seen.   MRI of the lumbar spine from  11/22/2023 was previously independently reviewed and interpreted, showing central and lateral recess stenosis at L4/5. Spondylolisthesis seen L4/5. T2 signal within the left L4/5 facet that measures about 0.1mm. Facet arthropathy at L4/5 and L5/S1. 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      Patient name: [name] Patient MRN: [id number] Date of visit: 05/04/24

## 2024-05-05 ENCOUNTER — Ambulatory Visit

## 2024-05-08 ENCOUNTER — Ambulatory Visit (INDEPENDENT_AMBULATORY_CARE_PROVIDER_SITE_OTHER): Admitting: Adult Health

## 2024-05-12 ENCOUNTER — Ambulatory Visit
Admission: RE | Admit: 2024-05-12 | Discharge: 2024-05-12 | Disposition: A | Discharge status: Home / Self Care | Source: Ambulatory Visit | Attending: Family Medicine | Admitting: Family Medicine

## 2024-05-17 ENCOUNTER — Ambulatory Visit (INDEPENDENT_AMBULATORY_CARE_PROVIDER_SITE_OTHER): Admitting: Adult Health

## 2024-05-17 ENCOUNTER — Encounter (INDEPENDENT_AMBULATORY_CARE_PROVIDER_SITE_OTHER): Payer: Self-pay | Admitting: Adult Health

## 2024-05-17 VITALS — BP 127/78 | HR 70 | Temp 98.7°F | Ht 60.0 in | Wt 169.0 lb

## 2024-05-17 DIAGNOSIS — Z6833 Body mass index (BMI) 33.0-33.9, adult: Secondary | ICD-10-CM

## 2024-05-17 DIAGNOSIS — M545 Low back pain, unspecified: Secondary | ICD-10-CM | POA: Diagnosis not present

## 2024-05-17 DIAGNOSIS — M5416 Radiculopathy, lumbar region: Secondary | ICD-10-CM

## 2024-05-17 DIAGNOSIS — E669 Obesity, unspecified: Secondary | ICD-10-CM

## 2024-05-17 DIAGNOSIS — E66811 Obesity, class 1: Secondary | ICD-10-CM

## 2024-05-17 DIAGNOSIS — I10 Essential (primary) hypertension: Secondary | ICD-10-CM | POA: Diagnosis not present

## 2024-05-17 DIAGNOSIS — R7303 Prediabetes: Secondary | ICD-10-CM | POA: Diagnosis not present

## 2024-05-17 NOTE — Progress Notes (Signed)
 WEIGHT SUMMARY AND BIOMETRICS  Vitals Temp: 98.7 F (37.1 C) BP: 127/78 Pulse Rate: 70 SpO2: 97 %   Anthropometric Measurements Height: 5' (1.524 m) Weight: 169 lb (76.7 kg) BMI (Calculated): 33.01 Weight at Last Visit: 167lb Weight Lost Since Last Visit: 0lb Weight Gained Since Last Visit: 2lb Starting Weight: 177lb Total Weight Loss (lbs): 8 lb (3.629 kg)   Body Composition  Body Fat %: 41.4 % Fat Mass (lbs): 70.2 lbs Muscle Mass (lbs): 94.6 lbs Total Body Water (lbs): 94.6 lbs Visceral Fat Rating : 12   Other Clinical Data Fasting: no Labs: no Today's Visit #: 28 Starting Date: 04/03/20    Chief Complaint:   OBESITY [name] is here to discuss her progress with her obesity treatment plan.  She is on the keeping a food journal and adhering to recommended goals of 1200 calories and 90g+ protein and states she is following her eating plan approximately 50 % of the time.  She states she is exercising: NEAT Activities  Interim History:  [name] has completed course of Water Physical Therapy (PT). She reports that during the PT sessions she was  able to move without difficulty or sig pain. She would need 24-72 hours to recover after each session. She is very frustrated that she is unable to exercise due to chronic back with radiculopathy. She is closely followed by [facility]- Dr. Georgina 06/27/2024-scheduled for  DECOMPRESSIVE LUMBAR LAMINECTOMY LEVEL 1 N/A General  L4-5 LAMINECTOMY     [place] and her husband will actually retire soon!  Subjective:   1. Pre-diabetes Lab Results  Component Value Date   HGBA1C 5.6 02/21/2024   HGBA1C 5.9 (H) 11/08/2023   HGBA1C 5.9 (H) 05/04/2023     Latest Reference Range & Units 02/21/24 11:00  eGFR >59 mL/min/1.73 84   She is currently on Metformin  750 XR TID- tolerating well  Of note- Been on GLP-1 therapy since 06/11/2021-  Saxenda  1.8mg  since Oct  2022. N/V/diarrhea - last dose of Victoza 1.8 mg on 12/16/2021.   She remained off the GLP-1 since then. Extreme polyphagia on 12/20/2021. She again denies family hx of MTC or personal hx of pancreatitis.  2. Low back pain, unspecified back pain laterality, unspecified chronicity, unspecified whether sciatica present 05/04/2024 [facility] Notes: History:   Patient is a 63 y.o. female who presents today for follow up on her lumbar spine.  Patient has a history of chronic low back pain but has developed pain radiating into her bilateral buttock and posterior thighs.  She has tried multiple conservative treatments for this but has not noticed any lasting relief with any of those treatments.  She has not developed any new symptoms since she was last seen in the office.  She is frustrated though because nothing has made her pain better for longer than a week.  She found the injection the most helpful.  She said the physical therapy has been painful and usually aggravates her symptoms.  It has not provided her with any relief.  3. Radiculopathy, lumbar region 05/04/2024 [facility] Notes: Orthopedic Spine Surgery Office Note   Assessment: Patient is a 63 y.o. female with chronic low back pain with new pain that radiates into bilateral posterior lateral thighs. 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  After discussion, patient elected to proceed -Patient will next be seen at date of surgery     The patient has symptoms consistent with lumbar radiculopathy. The patient's symptoms were not improving with conservative treatment so operative management was discussed in the form of L4/5  laminectomy. The risks including but not limited to iatrogenic instability, dural tear, nerve root injury, paralysis, persistent pain, infection, bleeding, heart attack, death, stroke, fracture, dvt/pe, and need for additional procedures were discussed with the patient. The benefit of the surgery would be improvement in the patient's radiating leg pain. I explained that back pain relief is not the goal of the surgery and it is not reliably alleviated with this surgery. The alternatives to surgical management were covered with the patient and included continued monitoring, physical therapy, over-the-counter pain medications, ambulatory aids, and activity modification. All the patient's questions were answered to her satisfaction. After this discussion, the patient expressed understanding and elected to proceed with surgical intervention.        4. Essential hypertension BP stable at OV She denies tobacco/vape use PCP manages  enalapril  (VASOTEC ) 20 MG tablet    Assessment/Plan:   1. Pre-diabetes (Primary) Continue Metformin  XR 750mg  TID- does not require refill today  2. Low back pain, unspecified back pain laterality, unspecified chronicity, unspecified whether sciatica present Increase protein, strive for at least 30g per meal. Remain as active as tolerated. F/u with Orthopedic Specialist as directed.  3. Radiculopathy, lumbar region Increase protein, strive for at least 30g per meal. Remain as active as tolerated. F/u with Orthopedic Specialist as directed.  4. Essential hypertension Limit Na+ intake Increase lean protein and remain well hydrated with water Continue antihyperensive therapy per PCP  5. Obesity (BMI 30.0-34.9), CURRENT BMI 33.2  [name] is currently in the action stage of change. As such, her goal is to continue with weight loss efforts. She has agreed to keeping a food journal and adhering to recommended goals of 1200 calories and 90g+ protein.   Strive for at  least 30g protein per meal.  Exercise goals: All adults should avoid inactivity. Some physical activity is better than none, and adults who participate in any amount of physical activity gain some health benefits. Adults should also include muscle-strengthening activities that involve all major muscle groups on 2 or more days a week. Activity to tolerance.  Behavioral modification strategies: increasing lean protein intake, decreasing simple carbohydrates, increasing vegetables, increasing water intake, no skipping meals, meal planning and cooking strategies, keeping healthy foods in the home, ways to avoid boredom eating, better snacking choices, and planning for success.  Sora has agreed to follow-up with our clinic in 4 weeks. She was informed of the importance of frequent follow-up visits to maximize her success with intensive lifestyle modifications for her multiple health conditions.   Objective:   Blood pressure 127/78, pulse 70, temperature 98.7 F (37.1 C), height 5' (1.524 m), weight 169 lb (76.7 kg), SpO2 97%. Body mass index is 33.01 kg/m.  General: Cooperative, alert, well developed, in no acute distress. HEENT: Conjunctivae and lids unremarkable. Cardiovascular: Regular rhythm.  Lungs: Normal work of breathing. Neurologic: No focal deficits.   Lab Results  Component Value Date   CREATININE 0.79 02/21/2024   BUN 18 02/21/2024   NA 144 02/21/2024   K 4.4 02/21/2024   CL 103 02/21/2024  CO2 21 02/21/2024   Lab Results  Component Value Date   ALT 17 02/21/2024   AST 18 02/21/2024   ALKPHOS 59 02/21/2024   BILITOT 0.4 02/21/2024   Lab Results  Component Value Date   HGBA1C 5.6 02/21/2024   HGBA1C 5.9 (H) 11/08/2023   HGBA1C 5.9 (H) 05/04/2023   HGBA1C 5.6 12/16/2022   HGBA1C 5.6 09/09/2022   Lab Results  Component Value Date   INSULIN  5.4 02/21/2024   INSULIN  14.8 11/08/2023   INSULIN  8.8 05/04/2023   INSULIN  6.5 12/16/2022   INSULIN  6.5 09/09/2022    Lab Results  Component Value Date   TSH 1.20 12/30/2023   Lab Results  Component Value Date   CHOL 259 (H) 09/09/2022   HDL 62 09/09/2022   LDLCALC 145 (H) 09/09/2022   TRIG 287 (H) 09/09/2022   CHOLHDL 4.2 09/09/2022   Lab Results  Component Value Date   VD25OH 81.6 02/21/2024   VD25OH 50.7 11/08/2023   VD25OH 40.7 05/04/2023   Lab Results  Component Value Date   WBC 6.6 09/09/2022   HGB 15.0 09/09/2022   HCT 43.8 09/09/2022   MCV 97 09/09/2022   PLT 354 09/09/2022   No results found for: IRON, TIBC, FERRITIN  Attestation Statements:   Reviewed by clinician on day of visit: allergies, medications, problem list, medical history, surgical history, family history, social history, and previous encounter notes.  "28 minutes spent face-to-face with the patient discussing management of disordered eating symptoms, reviewing current medications, and providing strategies for coping with emotional eating."  I have reviewed the above documentation for accuracy and completeness, and I agree with the above. -  Lovie Zarling d. Nickalus Thornsberry, NP-C

## 2024-05-18 ENCOUNTER — Other Ambulatory Visit (INDEPENDENT_AMBULATORY_CARE_PROVIDER_SITE_OTHER): Payer: Self-pay | Admitting: Adult Health

## 2024-05-18 DIAGNOSIS — R7303 Prediabetes: Secondary | ICD-10-CM

## 2024-05-22 ENCOUNTER — Ambulatory Visit (INDEPENDENT_AMBULATORY_CARE_PROVIDER_SITE_OTHER): Admitting: Adult Health

## 2024-05-24 ENCOUNTER — Ambulatory Visit: Payer: 59 | Admitting: Family Medicine

## 2024-05-27 ENCOUNTER — Other Ambulatory Visit: Payer: Self-pay | Admitting: Orthopedic Surgery

## 2024-06-14 ENCOUNTER — Encounter (INDEPENDENT_AMBULATORY_CARE_PROVIDER_SITE_OTHER): Payer: Self-pay | Admitting: Adult Health

## 2024-06-14 ENCOUNTER — Ambulatory Visit (INDEPENDENT_AMBULATORY_CARE_PROVIDER_SITE_OTHER): Admitting: Adult Health

## 2024-06-14 VITALS — BP 133/75 | HR 81 | Temp 97.9°F | Ht 60.0 in | Wt 169.0 lb

## 2024-06-14 DIAGNOSIS — E559 Vitamin D deficiency, unspecified: Secondary | ICD-10-CM | POA: Diagnosis not present

## 2024-06-14 DIAGNOSIS — Z6833 Body mass index (BMI) 33.0-33.9, adult: Secondary | ICD-10-CM

## 2024-06-14 DIAGNOSIS — E66811 Obesity, class 1: Secondary | ICD-10-CM

## 2024-06-14 DIAGNOSIS — I10 Essential (primary) hypertension: Secondary | ICD-10-CM | POA: Diagnosis not present

## 2024-06-14 DIAGNOSIS — R7303 Prediabetes: Secondary | ICD-10-CM

## 2024-06-14 DIAGNOSIS — M545 Low back pain, unspecified: Secondary | ICD-10-CM

## 2024-06-14 DIAGNOSIS — E669 Obesity, unspecified: Secondary | ICD-10-CM

## 2024-06-14 MED ORDER — METFORMIN HCL ER 750 MG PO TB24: ORAL_TABLET | ORAL | 0 refills | Status: DC

## 2024-06-14 MED ORDER — VITAMIN D (ERGOCALCIFEROL) 1.25 MG (50000 UNIT) PO CAPS: 50000.0000 [IU] | ORAL_CAPSULE | ORAL | 0 refills | Status: DC

## 2024-06-14 NOTE — Progress Notes (Signed)
 Surgical Instructions   Your procedure is scheduled on Tuesday June 27, 2024. Report to [facility] Main Entrance A at 5:30 A.M., then check in with the Admitting office. Any questions or running late day of surgery: call [phone]  Questions prior to your surgery date: call [phone], Monday-Friday, 8am-4pm. If you experience any cold or flu symptoms such as cough, fever, chills, shortness of breath, etc. between now and your scheduled surgery, please notify us  at the above number.     Remember:  Do not eat after midnight the night before your surgery  You may drink clear liquids until 4:30 the morning of your surgery.   Clear liquids allowed are: Water, Non-Citrus Juices (without pulp), Carbonated Beverages, Clear Tea (no milk, honey, etc.), Black Coffee Only (NO MILK, CREAM OR POWDERED CREAMER of any kind), and Gatorade.  Patient Instructions  The night before surgery:  No food after midnight. ONLY clear liquids after midnight  The day of surgery (if you do NOT have diabetes):  Drink ONE (1) Pre-Surgery Clear Ensure by 4:30 the morning of surgery. Drink in one sitting. Do not sip.  This drink was given to you during your hospital  pre-op appointment visit.  Nothing else to drink after completing the  Pre-Surgery Clear Ensure.         If you have questions, please contact your surgeon's office.   Take these medicines the morning of surgery with A SIP OF WATER  levothyroxine  (SYNTHROID )  pregabalin  (LYRICA )    DO NOT TAKE YOUR metFORMIN  (GLUCOPHAGE -XR) THE MORNING OF SURGERY.     One week prior to surgery, STOP taking any Aspirin (unless otherwise instructed by your surgeon) Aleve, Naproxen, Ibuprofen, Motrin, Advil, Goody's, BC's, all herbal medications, fish oil, and non-prescription vitamins. This includes your meloxicam  (MOBIC )                      Do NOT Smoke (Tobacco/Vaping) for 24 hours prior to your procedure.  If you use a CPAP at night, you may bring  your mask/headgear for your overnight stay.   You will be asked to remove any contacts, glasses, piercing's, hearing aid's, dentures/partials prior to surgery. Please bring cases for these items if needed.    Patients discharged the day of surgery will not be allowed to drive home, and someone needs to stay with them for 24 hours.  SURGICAL WAITING ROOM VISITATION Patients may have no more than 2 support people in the waiting area - these visitors may rotate.   Pre-op nurse will coordinate an appropriate time for 1 ADULT support person, who may not rotate, to accompany patient in pre-op.  Children under the age of 29 must have an adult with them who is not the patient and must remain in the main waiting area with an adult.  If the patient needs to stay at the hospital during part of their recovery, the visitor guidelines for inpatient rooms apply.  Please refer to the [facility] website for the visitor guidelines for any additional information.   If you received a COVID test during your pre-op visit  it is requested that you wear a mask when out in public, stay away from anyone that may not be feeling well and notify your surgeon if you develop symptoms. If you have been in contact with anyone that has tested positive in the last 10 days please notify you surgeon.      Pre-operative 5 CHG Bathing Instructions   You can  play a key role in reducing the risk of infection after surgery. Your skin needs to be as free of germs as possible. You can reduce the number of germs on your skin by washing with CHG (chlorhexidine gluconate) soap before surgery. CHG is an antiseptic soap that kills germs and continues to kill germs even after washing.   DO NOT use if you have an allergy to chlorhexidine/CHG or antibacterial soaps. If your skin becomes reddened or irritated, stop using the CHG and notify one of our RNs at [phone].   Please shower with the CHG soap starting 4 days before surgery using  the following schedule:     Please keep in mind the following:  DO NOT shave, including legs and underarms, starting the day of your first shower.   Place clean sheets on your bed the day you start using CHG soap. Use a clean washcloth (not used since being washed) for each shower. DO NOT sleep with pets once you start using the CHG.   CHG Shower Instructions:  Wash your face and private area with normal soap. If you choose to wash your hair, wash first with your normal shampoo.  After you use shampoo/soap, rinse your hair and body thoroughly to remove shampoo/soap residue.  Turn the water OFF and apply about 3 tablespoons (45 ml) of CHG soap to a CLEAN washcloth.  Apply CHG soap ONLY FROM YOUR NECK DOWN TO YOUR TOES (washing for 3-5 minutes)  DO NOT use CHG soap on face, private areas, open wounds, or sores.  Pay special attention to the area where your surgery is being performed.  If you are having back surgery, having someone wash your back for you may be helpful. Wait 2 minutes after CHG soap is applied, then you may rinse off the CHG soap.  Pat dry with a clean towel  Put on clean clothes/pajamas   If you choose to wear lotion, please use ONLY the CHG-compatible lotions that are listed below.  Additional instructions for the day of surgery: DO NOT APPLY any lotions, deodorants or perfumes.   Do not bring valuables to the hospital. [facility] is not responsible for any belongings/valuables. Do not wear nail polish, gel polish, artificial nails, or any other type of covering on natural nails (fingers and toes) Do not wear jewelry or makeup Put on clean/comfortable clothes.  Please brush your teeth.  Ask your nurse before applying any prescription medications to the skin.     CHG Compatible Lotions   Aveeno Moisturizing lotion  Cetaphil Moisturizing Cream  Cetaphil Moisturizing Lotion  Clairol Herbal Essence Moisturizing Lotion, Dry Skin  Clairol Herbal Essence  Moisturizing Lotion, Extra Dry Skin  Clairol Herbal Essence Moisturizing Lotion, Normal Skin  Curel Age Defying Therapeutic Moisturizing Lotion with Alpha Hydroxy  Curel Extreme Care Body Lotion  Curel Soothing Hands Moisturizing Hand Lotion  Curel Therapeutic Moisturizing Cream, Fragrance-Free  Curel Therapeutic Moisturizing Lotion, Fragrance-Free  Curel Therapeutic Moisturizing Lotion, Original Formula  Eucerin Daily Replenishing Lotion  Eucerin Dry Skin Therapy Plus Alpha Hydroxy Crme  Eucerin Dry Skin Therapy Plus Alpha Hydroxy Lotion  Eucerin Original Crme  Eucerin Original Lotion  Eucerin Plus Crme Eucerin Plus Lotion  Eucerin TriLipid Replenishing Lotion  Keri Anti-Bacterial Hand Lotion  Keri Deep Conditioning Original Lotion Dry Skin Formula Softly Scented  Keri Deep Conditioning Original Lotion, Fragrance Free Sensitive Skin Formula  Keri Lotion Fast Absorbing Fragrance Free Sensitive Skin Formula  Keri Lotion Fast Absorbing Softly Scented Dry Skin Formula  Keri Original Lotion  [employer] Skin Renewal Lotion Keri Silky Smooth Lotion  Keri Silky Smooth Sensitive Skin Lotion  Nivea Body Creamy Conditioning Patent examiner Moisturizing Lotion Nivea Crme  Nivea Skin Firming Lotion  NutraDerm 30 Skin Lotion  NutraDerm Skin Lotion  NutraDerm Therapeutic Skin Cream  NutraDerm Therapeutic Skin Lotion  ProShield Protective Hand Cream  Provon moisturizing lotion  Please read over the following fact sheets that you were given.

## 2024-06-14 NOTE — Progress Notes (Signed)
 WEIGHT SUMMARY AND BIOMETRICS  Vitals Temp: 97.9 F (36.6 C) BP: 133/75 Pulse Rate: 81 SpO2: 100 %   Anthropometric Measurements Height: 5' (1.524 m) Weight: 169 lb (76.7 kg) BMI (Calculated): 33.01 Weight at Last Visit: 169 lb Weight Lost Since Last Visit: 0 Weight Gained Since Last Visit: 0 Starting Weight: 177 lb Total Weight Loss (lbs): 8 lb (3.629 kg)   Body Composition  Body Fat %: 39.2 % Fat Mass (lbs): 66.4 lbs Muscle Mass (lbs): 97.8 lbs Total Body Water (lbs): 67 lbs Visceral Fat Rating : 11   Other Clinical Data Fasting: no Labs: no Today's Visit #: 29 Starting Date: 04/03/20    Chief Complaint:   OBESITY [name] is here to discuss her progress with her obesity treatment plan.  She is on the keeping a food journal and adhering to recommended goals of 1200 calories and 90g protein and states she is following her eating plan approximately 75 % of the time.  She states she is exercising Swimming 10 minutes 5 times per week.  Interim History:  [name] recently returned from a family beach trip- 6 days at [facility] Due to lay out beach community, she was walking 1000s steps/daily She has been focused on increasing lean protein intake.  She will travel to [facility] soon to celebrate her mother's 94th birthday. Her husband will not accompany her and she will be joined by most her siblings and their respective families.  She is hopeful that her upcoming back surgery will provide relief of lower ext pain and improve her overall mobility. She is using Lyrica  75mg  BID-she denies sx's pf sedation  Reviewed Bioimpedance results with pt: Muscle Mass: +3.2 lbs Adipose Mass: -3.8 lbs  Of Note- DECOMPRESSIVE LUMBAR LAMINECTOMY LEVEL 1 scheduled on 06/27/2024   Subjective:   1. Low back pain, unspecified back pain laterality, unspecified chronicity, unspecified whether sciatica present DECOMPRESSIVE LUMBAR LAMINECTOMY LEVEL 1 scheduled on  06/27/2024  Post Op OV with Surgeon is scheduled on 07/12/2024 She will have her husband, her brother, and her daughter in law to assist her after procedure.  2. Vitamin D  deficiency 04/02/2024 Ergocalciferol  decreased from weekly to Q14 days She denies N/V/Muscle Weakness  3. Pre-diabetes Lab Results  Component Value Date   HGBA1C 5.6 02/21/2024   HGBA1C 5.9 (H) 11/08/2023   HGBA1C 5.9 (H) 05/04/2023    She is on Metformin  XR 750mg  TID She denies GI upset She endorses stable appetite the last 3-4 weeks  4. Essential hypertension BP stable at OV  Assessment/Plan:   1. Low back pain, unspecified back pain laterality, unspecified chronicity, unspecified whether sciatica present Continue to increase daily protein intake F/u with Orthopedic Care Team as directed  2. Vitamin D  deficiency Refill Vitamin D , Ergocalciferol , (DRISDOL ) 1.25 MG (50000 UNIT) CAPS capsule Take 1 capsule (50,000 Units total) by mouth every 14 (fourteen) days. Dispense: 4 capsule, Refills: 0 ordered   3. Pre-diabetes (Primary) Refill  metFORMIN  (GLUCOPHAGE -XR) 750 MG 24 hr tablet 1 tab three times daily with meals Dispense: 90 tablet, Refills: 0 ordered   4. Essential hypertension Limit Na+ Continue regular exercise- as tolerated, re: chronic back pain  5. Obesity (BMI 30.0-34.9), CURRENT BMI 33.1  [name] is currently in the action stage of change. As such, her goal is to continue with weight loss efforts. She has agreed to 90g+  Exercise goals: All adults should avoid inactivity. Some physical activity is better than none, and adults who participate in  any amount of physical activity gain some health benefits. Adults should also include muscle-strengthening activities that involve all major muscle groups on 2 or more days a week.  Behavioral modification strategies: increasing lean protein intake, decreasing simple carbohydrates, increasing vegetables, increasing water intake, no skipping meals, meal  planning and cooking strategies, keeping healthy foods in the home, ways to avoid boredom eating, travel eating strategies, and planning for success.  [name] has agreed to follow-up with our clinic in 4 weeks. She was informed of the importance of frequent follow-up visits to maximize her success with intensive lifestyle modifications for her multiple health conditions.   Objective:   Blood pressure 133/75, pulse 81, temperature 97.9 F (36.6 C), height 5' (1.524 m), weight 169 lb (76.7 kg), SpO2 100%. Body mass index is 33.01 kg/m.  General: Cooperative, alert, well developed, in no acute distress. HEENT: Conjunctivae and lids unremarkable. Cardiovascular: Regular rhythm.  Lungs: Normal work of breathing. Neurologic: No focal deficits.   Lab Results  Component Value Date   CREATININE 0.79 02/21/2024   BUN 18 02/21/2024   NA 144 02/21/2024   K 4.4 02/21/2024   CL 103 02/21/2024   CO2 21 02/21/2024   Lab Results  Component Value Date   ALT 17 02/21/2024   AST 18 02/21/2024   ALKPHOS 59 02/21/2024   BILITOT 0.4 02/21/2024   Lab Results  Component Value Date   HGBA1C 5.6 02/21/2024   HGBA1C 5.9 (H) 11/08/2023   HGBA1C 5.9 (H) 05/04/2023   HGBA1C 5.6 12/16/2022   HGBA1C 5.6 09/09/2022   Lab Results  Component Value Date   INSULIN  5.4 02/21/2024   INSULIN  14.8 11/08/2023   INSULIN  8.8 05/04/2023   INSULIN  6.5 12/16/2022   INSULIN  6.5 09/09/2022   Lab Results  Component Value Date   TSH 1.20 12/30/2023   Lab Results  Component Value Date   CHOL 259 (H) 09/09/2022   HDL 62 09/09/2022   LDLCALC 145 (H) 09/09/2022   TRIG 287 (H) 09/09/2022   CHOLHDL 4.2 09/09/2022   Lab Results  Component Value Date   VD25OH 81.6 02/21/2024   VD25OH 50.7 11/08/2023   VD25OH 40.7 05/04/2023   Lab Results  Component Value Date   WBC 6.6 09/09/2022   HGB 15.0 09/09/2022   HCT 43.8 09/09/2022   MCV 97 09/09/2022   PLT 354 09/09/2022   No results found for: IRON,  TIBC, FERRITIN  Attestation Statements:   Reviewed by clinician on day of visit: allergies, medications, problem list, medical history, surgical history, family history, social history, and previous encounter notes.  I have reviewed the above documentation for accuracy and completeness, and I agree with the above. -  Daquawn Seelman d. Alyha Marines, NP-C

## 2024-06-15 ENCOUNTER — Other Ambulatory Visit: Payer: Self-pay

## 2024-06-15 ENCOUNTER — Encounter (HOSPITAL_COMMUNITY): Payer: Self-pay

## 2024-06-15 ENCOUNTER — Encounter (HOSPITAL_COMMUNITY)
Admission: RE | Admit: 2024-06-15 | Discharge: 2024-06-15 | Disposition: A | Discharge status: Home / Self Care | Source: Ambulatory Visit | Attending: Orthopedic Surgery | Admitting: Orthopedic Surgery

## 2024-06-15 VITALS — BP 128/80 | HR 74 | Temp 97.9°F | Resp 16 | Ht 60.0 in | Wt 171.8 lb

## 2024-06-15 DIAGNOSIS — Z01818 Encounter for other preprocedural examination: Secondary | ICD-10-CM | POA: Diagnosis present

## 2024-06-15 HISTORY — DX: Unspecified osteoarthritis, unspecified site: M19.90

## 2024-06-15 LAB — BASIC METABOLIC PANEL WITH GFR
Anion gap: 8 (ref 5–15)
BUN: 24 mg/dL — ABNORMAL HIGH (ref 8–23)
CO2: 25 mmol/L (ref 22–32)
Calcium: 10.2 mg/dL (ref 8.9–10.3)
Chloride: 105 mmol/L (ref 98–111)
Creatinine, Ser: 0.88 mg/dL (ref 0.44–1.00)
GFR, Estimated: >60 mL/min (ref >60)
Glucose, Bld: 113 mg/dL — ABNORMAL HIGH (ref 70–99)
Potassium: 4.3 mmol/L (ref 3.5–5.1)
Sodium: 138 mmol/L (ref 135–145)

## 2024-06-15 LAB — CBC
HCT: 46.2 % — ABNORMAL HIGH (ref 36.0–46.0)
Hemoglobin: 15.8 g/dL — ABNORMAL HIGH (ref 12.0–15.0)
MCH: 32.3 pg (ref 26.0–34.0)
MCHC: 34.2 g/dL (ref 30.0–36.0)
MCV: 94.5 fL (ref 80.0–100.0)
Platelets: 368 K/uL (ref 150–400)
RBC: 4.89 MIL/uL (ref 3.87–5.11)
RDW: 13.2 % (ref 11.5–15.5)
WBC: 7.9 K/uL (ref 4.0–10.5)
nRBC: 0 % (ref 0.0–0.2)

## 2024-06-15 LAB — SURGICAL PCR SCREEN
MRSA, PCR: NEGATIVE
Staphylococcus aureus: NEGATIVE

## 2024-06-15 NOTE — Progress Notes (Signed)
 PCP - Dr. Reyes Seip Cardiologist - denies  PPM/ICD - denies   Chest x-ray - denies EKG - 06/15/24 Stress Test - denies ECHO - denies Cardiac Cath - denies  Sleep Study - denies  Pre-diabetes - does not check blood sugar at home.    Last dose of GLP1 agonist-  n/a GLP1 instructions:  n/a  Blood Thinner Instructions:  n/a Aspirin Instructions: n/a  ERAS Protcol - clears until 0430 PRE-SURGERY Ensure or G2- Ensure as ordered  COVID TEST- n/a   Anesthesia review: no  Patient denies shortness of breath, fever, cough and chest pain at PAT appointment   All instructions explained to the patient, with a verbal understanding of the material. Patient agrees to go over the instructions while at home for a better understanding. Patient also instructed to self quarantine after being tested for COVID-19. The opportunity to ask questions was provided.

## 2024-06-26 NOTE — H&P (Signed)
 Orthopedic Spine Surgery H&P Note  Assessment: Patient is a 63 y.o. female with lumbar radiculopathy   Plan: -Out of bed as tolerated, activity as tolerated, no brace -Covered the risks of surgery one more time with the patient and patient elected to proceed with planned surgery -Written consent verified -Hold anticoagulation in anticipation of surgery -Ancef  and TXA on all to OR -NPO for procedure -Site marked -To OR when ready   The risks covered this morning included but were not limited to: iatrogenic instability, DVT/PE, dural tear, nerve root injury, paralysis, persistent pain, infection, bleeding, heart attack, death, fracture, and need for additional procedures. ___________________________________________________________________________  Chief Complaint: Low back pain that radiates into the bilateral posterolateral thighs  History: Patient is 63 y.o. female who has been previously seen in the office for low back pain that radiates into her bilateral posterolateral thighs.  Workup was consistent with lumbar radiculopathy.  Her symptoms failed to improve with conservative treatment so operative management was discussed at the last office visit. The patient presents today with no changes in her symptoms since the last office visit. See previous office note for further details.    Review of systems: General: denies fevers and chills, myalgias Neurologic: denies recent changes in vision, slurred speech Abdomen: denies nausea, vomiting, hematemesis Respiratory: denies cough, shortness of breath  Past medical history: HLD HTN Migraines Chronic pain CKD Hypothyroidism   Allergies: sulfa, lipitor, liraglutide    Past surgical history:  Harrington rod placement for L1 fracture C-section Meniscus surgery   Social history: Denies use of nicotine product (smoking, vaping, patches, smokeless) Alcohol use: Yes, approximately 5 drinks per week Denies recreational drug  use  Family history: -reviewed and not pertinent to lumbar radiculopathy   Physical Exam:  BMI of 33.4  General: no acute distress, appears stated age Neurologic: alert, answering questions appropriately, following commands Cardiovascular: regular rate, no cyanosis Respiratory: unlabored breathing on room air, symmetric chest rise Psychiatric: appropriate affect, normal cadence to speech   MSK (spine):  -Strength exam      Left  Right  EHL    5/5  5/5 TA    5/5  5/5 GSC    5/5  5/5 Knee extension  5/5  5/5 Knee flexion   5/5  5/5 Hip flexion   5/5  5/5  -Sensory exam    Sensation intact to light touch in L3-S1 nerve distributions of bilateral lower extremities   Patient name: [name] Patient MRN: [id number] Date: 06/27/2024

## 2024-06-27 ENCOUNTER — Ambulatory Visit (HOSPITAL_COMMUNITY)

## 2024-06-27 ENCOUNTER — Ambulatory Visit (HOSPITAL_COMMUNITY): Payer: Self-pay

## 2024-06-27 ENCOUNTER — Ambulatory Visit (HOSPITAL_BASED_OUTPATIENT_CLINIC_OR_DEPARTMENT_OTHER): Payer: Self-pay

## 2024-06-27 ENCOUNTER — Other Ambulatory Visit (HOSPITAL_COMMUNITY): Payer: Self-pay

## 2024-06-27 ENCOUNTER — Encounter (HOSPITAL_COMMUNITY)
Admission: RE | Disposition: A | Discharge status: Home / Self Care | Payer: Self-pay | Source: Home / Self Care | Attending: Orthopedic Surgery

## 2024-06-27 ENCOUNTER — Other Ambulatory Visit: Payer: Self-pay

## 2024-06-27 ENCOUNTER — Encounter (HOSPITAL_COMMUNITY): Payer: Self-pay | Admitting: Orthopedic Surgery

## 2024-06-27 ENCOUNTER — Ambulatory Visit (HOSPITAL_COMMUNITY)
Admission: RE | Admit: 2024-06-27 | Discharge: 2024-06-27 | Disposition: A | Discharge status: Home / Self Care | Attending: Orthopedic Surgery | Admitting: Orthopedic Surgery

## 2024-06-27 DIAGNOSIS — Z01818 Encounter for other preprocedural examination: Secondary | ICD-10-CM

## 2024-06-27 DIAGNOSIS — I10 Essential (primary) hypertension: Secondary | ICD-10-CM | POA: Diagnosis not present

## 2024-06-27 DIAGNOSIS — M4726 Other spondylosis with radiculopathy, lumbar region: Secondary | ICD-10-CM

## 2024-06-27 DIAGNOSIS — G8929 Other chronic pain: Secondary | ICD-10-CM | POA: Diagnosis not present

## 2024-06-27 DIAGNOSIS — M5416 Radiculopathy, lumbar region: Secondary | ICD-10-CM | POA: Diagnosis present

## 2024-06-27 DIAGNOSIS — E039 Hypothyroidism, unspecified: Secondary | ICD-10-CM | POA: Insufficient documentation

## 2024-06-27 DIAGNOSIS — E785 Hyperlipidemia, unspecified: Secondary | ICD-10-CM | POA: Insufficient documentation

## 2024-06-27 DIAGNOSIS — M48061 Spinal stenosis, lumbar region without neurogenic claudication: Secondary | ICD-10-CM | POA: Diagnosis not present

## 2024-06-27 DIAGNOSIS — I129 Hypertensive chronic kidney disease with stage 1 through stage 4 chronic kidney disease, or unspecified chronic kidney disease: Secondary | ICD-10-CM | POA: Insufficient documentation

## 2024-06-27 DIAGNOSIS — N189 Chronic kidney disease, unspecified: Secondary | ICD-10-CM | POA: Insufficient documentation

## 2024-06-27 HISTORY — PX: DECOMPRESSIVE LUMBAR LAMINECTOMY LEVEL 1: SHX5791

## 2024-06-27 SURGERY — DECOMPRESSIVE LUMBAR LAMINECTOMY LEVEL 1
Anesthesia: General | Site: Spine Lumbar

## 2024-06-27 MED ORDER — BUPIVACAINE-EPINEPHRINE 0.25% -1:200000 IJ SOLN
INTRAMUSCULAR | Status: DC | PRN
  Administered 2024-06-27 (×2): 30 mL

## 2024-06-27 MED ORDER — ROCURONIUM BROMIDE 10 MG/ML (PF) SYRINGE
PREFILLED_SYRINGE | INTRAVENOUS | Status: AC
  Filled 2024-06-27: qty 10

## 2024-06-27 MED ORDER — SUGAMMADEX SODIUM 200 MG/2ML IV SOLN
INTRAVENOUS | Status: DC | PRN
  Administered 2024-06-27 (×2): 100 mg via INTRAVENOUS
  Administered 2024-06-27 (×4): 50 mg via INTRAVENOUS

## 2024-06-27 MED ORDER — POLYETHYLENE GLYCOL 3350 17 GM/SCOOP PO POWD
17.0000 g | Freq: Every day | ORAL | 0 refills | Status: AC
  Filled 2024-06-27: qty 238, 14d supply, fill #0

## 2024-06-27 MED ORDER — VANCOMYCIN HCL 1000 MG IV SOLR
INTRAVENOUS | Status: DC | PRN
  Administered 2024-06-27 (×2): 1000 mg

## 2024-06-27 MED ORDER — OXYCODONE HCL 5 MG PO TABS
5.0000 mg | ORAL_TABLET | ORAL | 0 refills | Status: AC | PRN
  Filled 2024-06-27: qty 40, 7d supply, fill #0

## 2024-06-27 MED ORDER — 0.9 % SODIUM CHLORIDE (POUR BTL) OPTIME
TOPICAL | Status: DC | PRN
  Administered 2024-06-27 (×2): 1000 mL

## 2024-06-27 MED ORDER — TRANEXAMIC ACID-NACL 1000-0.7 MG/100ML-% IV SOLN
1000.0000 mg | INTRAVENOUS | Status: AC
  Administered 2024-06-27 (×2): 1000 mg via INTRAVENOUS
  Filled 2024-06-27: qty 100

## 2024-06-27 MED ORDER — EPHEDRINE SULFATE (PRESSORS) 50 MG/ML IJ SOLN
INTRAMUSCULAR | Status: DC | PRN
  Administered 2024-06-27 (×4): 5 mg via INTRAVENOUS

## 2024-06-27 MED ORDER — FENTANYL CITRATE (PF) 250 MCG/5ML IJ SOLN
INTRAMUSCULAR | Status: AC
  Filled 2024-06-27: qty 5

## 2024-06-27 MED ORDER — AMISULPRIDE (ANTIEMETIC) 5 MG/2ML IV SOLN: 10.0000 mg | Freq: Once | INTRAVENOUS | Status: DC | PRN

## 2024-06-27 MED ORDER — OXYCODONE HCL 5 MG PO TABS
ORAL_TABLET | ORAL | Status: AC
  Filled 2024-06-27: qty 1

## 2024-06-27 MED ORDER — OXYCODONE HCL 5 MG/5ML PO SOLN: 5.0000 mg | Freq: Once | ORAL | Status: AC | PRN

## 2024-06-27 MED ORDER — FENTANYL CITRATE (PF) 100 MCG/2ML IJ SOLN
INTRAMUSCULAR | Status: AC
Start: 2024-06-27 — End: 2024-06-27
  Filled 2024-06-27: qty 2

## 2024-06-27 MED ORDER — POVIDONE-IODINE 10 % EX SWAB
2.0000 | Freq: Once | CUTANEOUS | Status: AC
  Administered 2024-06-27 (×2): 2 via TOPICAL

## 2024-06-27 MED ORDER — PROPOFOL 10 MG/ML IV BOLUS
INTRAVENOUS | Status: AC
  Filled 2024-06-27: qty 20

## 2024-06-27 MED ORDER — PHENYLEPHRINE HCL-NACL 20-0.9 MG/250ML-% IV SOLN
INTRAVENOUS | Status: DC | PRN
  Administered 2024-06-27 (×2): 20 ug/min via INTRAVENOUS

## 2024-06-27 MED ORDER — MIDAZOLAM HCL 2 MG/2ML IJ SOLN
INTRAMUSCULAR | Status: AC
  Filled 2024-06-27: qty 2

## 2024-06-27 MED ORDER — LACTATED RINGERS IV SOLN: INTRAVENOUS | Status: DC

## 2024-06-27 MED ORDER — MIDAZOLAM HCL 2 MG/2ML IJ SOLN
INTRAMUSCULAR | Status: DC | PRN
  Administered 2024-06-27 (×2): 2 mg via INTRAVENOUS

## 2024-06-27 MED ORDER — LIDOCAINE 2% (20 MG/ML) 5 ML SYRINGE
INTRAMUSCULAR | Status: DC | PRN
  Administered 2024-06-27 (×2): 60 mg via INTRAVENOUS

## 2024-06-27 MED ORDER — DEXAMETHASONE SODIUM PHOSPHATE 10 MG/ML IJ SOLN
INTRAMUSCULAR | Status: AC
  Filled 2024-06-27: qty 1

## 2024-06-27 MED ORDER — PHENYLEPHRINE 80 MCG/ML (10ML) SYRINGE FOR IV PUSH (FOR BLOOD PRESSURE SUPPORT)
PREFILLED_SYRINGE | INTRAVENOUS | Status: DC | PRN
  Administered 2024-06-27: 160 ug via INTRAVENOUS
  Administered 2024-06-27 (×2): 80 ug via INTRAVENOUS
  Administered 2024-06-27: 160 ug via INTRAVENOUS

## 2024-06-27 MED ORDER — ORAL CARE MOUTH RINSE: 15.0000 mL | Freq: Once | OROMUCOSAL | Status: AC

## 2024-06-27 MED ORDER — METHOCARBAMOL 500 MG PO TABS
500.0000 mg | ORAL_TABLET | Freq: Four times a day (QID) | ORAL | 0 refills | Status: AC
  Filled 2024-06-27: qty 40, 10d supply, fill #0

## 2024-06-27 MED ORDER — ACETAMINOPHEN 10 MG/ML IV SOLN: 1000.0000 mg | Freq: Once | INTRAVENOUS | Status: DC | PRN

## 2024-06-27 MED ORDER — FENTANYL CITRATE (PF) 250 MCG/5ML IJ SOLN
INTRAMUSCULAR | Status: DC | PRN
  Administered 2024-06-27 (×2): 50 ug via INTRAVENOUS
  Administered 2024-06-27 (×2): 100 ug via INTRAVENOUS

## 2024-06-27 MED ORDER — VANCOMYCIN HCL 1000 MG IV SOLR
INTRAVENOUS | Status: AC
Start: 2024-06-27 — End: 2024-06-27
  Filled 2024-06-27: qty 20

## 2024-06-27 MED ORDER — ACETAMINOPHEN 500 MG PO TABS
1000.0000 mg | ORAL_TABLET | Freq: Three times a day (TID) | ORAL | 0 refills | Status: AC
Start: 2024-06-27 — End: 2024-07-11
  Filled 2024-06-27: qty 84, 14d supply, fill #0

## 2024-06-27 MED ORDER — FENTANYL CITRATE (PF) 100 MCG/2ML IJ SOLN
25.0000 ug | INTRAMUSCULAR | Status: DC | PRN
  Administered 2024-06-27 (×2): 25 ug via INTRAVENOUS

## 2024-06-27 MED ORDER — CEFAZOLIN SODIUM-DEXTROSE 2-4 GM/100ML-% IV SOLN
2.0000 g | INTRAVENOUS | Status: AC
  Administered 2024-06-27 (×2): 2 g via INTRAVENOUS
  Filled 2024-06-27: qty 100

## 2024-06-27 MED ORDER — SENNA 8.6 MG PO TABS
1.0000 | ORAL_TABLET | Freq: Two times a day (BID) | ORAL | 0 refills | Status: AC
  Filled 2024-06-27: qty 28, 14d supply, fill #0

## 2024-06-27 MED ORDER — LIDOCAINE 2% (20 MG/ML) 5 ML SYRINGE
INTRAMUSCULAR | Status: AC
Start: 2024-06-27 — End: 2024-06-27
  Filled 2024-06-27: qty 5

## 2024-06-27 MED ORDER — OXYCODONE HCL 5 MG PO TABS
5.0000 mg | ORAL_TABLET | Freq: Once | ORAL | Status: AC | PRN
  Administered 2024-06-27 (×2): 5 mg via ORAL

## 2024-06-27 MED ORDER — BUPIVACAINE-EPINEPHRINE (PF) 0.25% -1:200000 IJ SOLN
INTRAMUSCULAR | Status: AC
Start: 2024-06-27 — End: 2024-06-27
  Filled 2024-06-27: qty 30

## 2024-06-27 MED ORDER — ONDANSETRON HCL 4 MG/2ML IJ SOLN
INTRAMUSCULAR | Status: AC
  Filled 2024-06-27: qty 2

## 2024-06-27 MED ORDER — ROCURONIUM BROMIDE 10 MG/ML (PF) SYRINGE
PREFILLED_SYRINGE | INTRAVENOUS | Status: DC | PRN
  Administered 2024-06-27: 10 mg via INTRAVENOUS
  Administered 2024-06-27: 70 mg via INTRAVENOUS
  Administered 2024-06-27 (×2): 10 mg via INTRAVENOUS
  Administered 2024-06-27: 70 mg via INTRAVENOUS
  Administered 2024-06-27: 10 mg via INTRAVENOUS

## 2024-06-27 MED ORDER — DEXAMETHASONE SODIUM PHOSPHATE 10 MG/ML IJ SOLN
10.0000 mg | Freq: Once | INTRAMUSCULAR | Status: AC
  Administered 2024-06-27 (×2): 10 mg via INTRAVENOUS
  Filled 2024-06-27: qty 1

## 2024-06-27 MED ORDER — PROPOFOL 10 MG/ML IV BOLUS
INTRAVENOUS | Status: DC | PRN
  Administered 2024-06-27 (×2): 180 mg via INTRAVENOUS

## 2024-06-27 MED ORDER — ONDANSETRON HCL 4 MG/2ML IJ SOLN
INTRAMUSCULAR | Status: DC | PRN
  Administered 2024-06-27 (×2): 4 mg via INTRAVENOUS

## 2024-06-27 MED ORDER — CHLORHEXIDINE GLUCONATE 0.12 % MT SOLN
15.0000 mL | Freq: Once | OROMUCOSAL | Status: AC
  Administered 2024-06-27 (×2): 15 mL via OROMUCOSAL
  Filled 2024-06-27: qty 15

## 2024-06-27 SURGICAL SUPPLY — 34 items
BLADE CLIPPER SURG (BLADE) IMPLANT
BUR MATCHSTICK NEURO 3.0 LAGG (BURR) ×1 IMPLANT
CANISTER SUCTION 3000ML PPV (SUCTIONS) ×1 IMPLANT
COVER MAYO STAND STRL (DRAPES) ×3 IMPLANT
COVER SURGICAL LIGHT HANDLE (MISCELLANEOUS) ×1 IMPLANT
DERMABOND ADVANCED .7 DNX12 (GAUZE/BANDAGES/DRESSINGS) ×1 IMPLANT
DRAPE C-ARM 42X72 X-RAY (DRAPES) ×1 IMPLANT
DRAPE UTILITY XL STRL (DRAPES) ×2 IMPLANT
DRESSING MEPILEX FLEX 4X4 (GAUZE/BANDAGES/DRESSINGS) ×1 IMPLANT
DRSG MEPILEX POST OP 4X8 (GAUZE/BANDAGES/DRESSINGS) ×1 IMPLANT
DRSG TEGADERM 4X4.75 (GAUZE/BANDAGES/DRESSINGS) ×2 IMPLANT
DURAPREP 26ML APPLICATOR (WOUND CARE) ×1 IMPLANT
ELECT COATED BLADE 2.86 ST (ELECTRODE) ×1 IMPLANT
ELECT PENCIL ROCKER SW 15FT (MISCELLANEOUS) ×1 IMPLANT
ELECTRODE REM PT RTRN 9FT ADLT (ELECTROSURGICAL) ×1 IMPLANT
GAUZE SPONGE 4X4 12PLY STRL (GAUZE/BANDAGES/DRESSINGS) ×1 IMPLANT
GLOVE INDICATOR 7.5 STRL GRN (GLOVE) ×1 IMPLANT
GLOVE SS BIOGEL STRL SZ 7.5 (GLOVE) ×1 IMPLANT
GOWN STRL REUS W/ TWL LRG LVL3 (GOWN DISPOSABLE) ×1 IMPLANT
GOWN STRL SURGICAL XL XLNG (GOWN DISPOSABLE) ×1 IMPLANT
KIT BASIN OR (CUSTOM PROCEDURE TRAY) ×1 IMPLANT
KIT POSITIONER JACKSON TABLE (MISCELLANEOUS) ×1 IMPLANT
KIT TURNOVER KIT B (KITS) ×1 IMPLANT
NS IRRIG 1000ML POUR BTL (IV SOLUTION) ×1 IMPLANT
PACK LAMINECTOMY ORTHO (CUSTOM PROCEDURE TRAY) ×1 IMPLANT
SUCTION TUBE FRAZIER 10FR DISP (SUCTIONS) ×1 IMPLANT
SUT BONE WAX W31G (SUTURE) ×1 IMPLANT
SUT MNCRL AB 3-0 PS2 27 (SUTURE) ×1 IMPLANT
SUT VIC AB 0 CT1 18XCR BRD8 (SUTURE) ×1 IMPLANT
SUT VIC AB 2-0 CT1 18 (SUTURE) ×1 IMPLANT
TOWEL GREEN STERILE (TOWEL DISPOSABLE) ×1 IMPLANT
TOWEL GREEN STERILE FF (TOWEL DISPOSABLE) ×1 IMPLANT
TUBING FEATHERFLOW (TUBING) ×1 IMPLANT
WATER STERILE IRR 1000ML POUR (IV SOLUTION) ×1 IMPLANT

## 2024-06-27 NOTE — Anesthesia Postprocedure Evaluation (Signed)
 Anesthesia Post Note  Patient: [name]  Procedure(s) Performed: DECOMPRESSIVE LUMBAR FOUR-LUMBAR FIVE LAMINECTOMY (Spine Lumbar)     Patient location during evaluation: PACU Anesthesia Type: General Level of consciousness: awake Pain management: pain level controlled Vital Signs Assessment: post-procedure vital signs reviewed and stable Respiratory status: spontaneous breathing, nonlabored ventilation and respiratory function stable Cardiovascular status: blood pressure returned to baseline and stable Postop Assessment: no apparent nausea or vomiting Anesthetic complications: no   No notable events documented.  Last Vitals:  Vitals:   06/27/24 1115 06/27/24 1130  BP: 120/69 116/70  Pulse: 89 89  Resp: 13 10  Temp:  36.8 C  SpO2: 95% 95%    Last Pain:  Vitals:   06/27/24 1015  TempSrc:   PainSc: Asleep                 Larrie Lucia P Oren Barella

## 2024-06-27 NOTE — Transfer of Care (Signed)
 Immediate Anesthesia Transfer of Care Note  Patient: Rollene LITTIE Bough  Procedure(s) Performed: DECOMPRESSIVE LUMBAR FOUR-LUMBAR FIVE LAMINECTOMY (Spine Lumbar)  Patient Location: PACU  Anesthesia Type:General  Level of Consciousness: awake, oriented, and patient cooperative  Airway & Oxygen Therapy: Patient Spontanous Breathing and Patient connected to face mask oxygen  Post-op Assessment: Report given to RN and Post -op Vital signs reviewed and stable  Post vital signs: Reviewed and stable  Last Vitals:  Vitals Value Taken Time  BP 147/77 06/27/24 10:13  Temp 98.6   Pulse 114 06/27/24 10:16  Resp 20 06/27/24 10:16  SpO2 99 % 06/27/24 10:16  Vitals shown include unfiled device data.  Last Pain:  Vitals:   06/27/24 0558  TempSrc: Temporal  PainSc:          Complications: No notable events documented.

## 2024-06-27 NOTE — Op Note (Signed)
 Orthopedic Spine Surgery Operative Report  Procedure: L4, L5 lumbar laminectomies and partial medial facetectomies  Modifier: none  Date of procedure: 06/27/2024  Patient name: [name] MRN: [id number] DOB: 1961-07-21  Surgeon: Ozell Ada, MD Assistant: none Pre-operative diagnosis: lumbar stenosis, lumbar radiculopathy Post-operative diagnosis: same as above Findings: L4/5 hypertrophic facets and thickened ligamentum flavum  Specimens: none Anesthesia: general EBL: 50cc Complications: none Pre-incision antibiotic: ancef  TXA was given prior to incision as well  Implants: none   Indication for procedure: Patient is a 63 y.o. female who presented to the office with symptoms consistent with lumbar radiculopathy. The patient had tried conservative treatments that did not provide any lasting relief. As result, operative management was discussed. L4 and L5 segment laminectomies with partial medial facetectomies was presented as a treatment option. The risks including but not limited to iatrogenic instability, dural tear, nerve root injury, paralysis, persistent pain, infection, bleeding, heart attack, death, stroke, fracture, and need for additional procedures were discussed with the patient. The benefit of the surgery would be relief of the patient's radiating leg pain. The alternatives to surgical management were covered with the patient and included continued monitoring, physical therapy, over-the-counter pain medications, ambulatory aids, injections, and activity modification. All the patient's questions were answered to her satisfaction. After this discussion, the patient expressed understanding and elected to proceed with surgical intervention.   Procedure Description: The patient was met in the pre-operative holding area. The patient's identity and consent were verified. The operative site was marked. The patient's remaining questions about the surgery were answered. The  patient was brought back to the operating room. General anesthesia was induced and an endotracheal tube was placed by the anesthesia staff. The patient was transferred to the prone Karns table in the prone position. All bony prominences were well padded. The head of the bed was slightly elevated and the eyes were free from compression by the face pillow. The surgical area was cleansed with alcohol. Fluoroscopy was then brought in to check rotation on the AP image and to mark the levels on the lateral image. The patient's skin was then prepped and draped in a standard, sterile fashion. A time out was performed that identified the patient, the procedure, and the operative levels. All team members agreed with what was stated in the time out.   A midline incision over the spinous processes of the previously marked levels was made and sharp dissection was continued down through the skin and dermis. Electrocautery was then used to continue the midline dissection down to the level of the spinous process. Subperiosteal dissection was performed using electrocautery to expose the lamina out lateral to the facet joint capsule. Care was taken to not violate the facet joint capsules. A lateral fluoroscopic image was taken to confirm the level. Subperiosteal dissection with electrocautery was then done to expose all the lamina and pars interarticularis at L4 and L5. Again, care was taken to avoid disruption of the facet capsules.    A rongeur was used to remove the spinous processes and interspinous ligaments between the L3/4 interspinous ligament to the cranial portion of the L5 spinous process. Bone wax was used to obtain hemostasis at the bleeding bony surfaces. A high-speed burr was used to thin the lamina at L4 and the cranial aspect of L5 to the level of the ligamentum flavum. Above the level of the ligamentum, the lamina was thinned with the burr to the approximate level of the ligamentum. Care was taken to leave at  least 8mm of pars interarticularis on each side. A series of Kerrison rongeurs were used to remove the remaining lamina and ligamentum overlying the thecal sac. A woodsen was then used to protect the thecal sac as a kerrison was used to remove the medial L4/5 facet joint. The same process was then repeated to remove the medial portion of the contralateral L4/5 facet joint.   A woodsen was placed into the laminectomy site to palpate for any remaining areas of stenosis. The woodsen was able to be passed freely and easily around the thecal sac with no further stenosis felt.    The wound was copiously irrigated with sterile saline. 1g of vancomycin  powder was placed into the wound. 30cc of 0.25% marcaine  with epinephrine  was injected into the paraspinous muscles, the subcutaneous tissue, and the dermis. The fascia was reapproximated with 0 vicryl suture. The subcutaneous fat was reapproximated with 0 vicryl suture. The deep dermal layer was reapproximated with 2-0 vicryl. The skin as closed with a 3-0 running monocryl. All counts were correct at the end of the case. Dermabond was applied over the skin. An island dressing was placed over the wound. The patient was transferred back to a bed and brought to the post-anesthesia care unit by anesthesia staff in stable condition.  Post-operative plan: The patient will recover in the post-anesthesia care unit with plan to go home after she ambulates, has a stable neuro exam from pre-op, voids spontaneously, and her pain is well controlled. She will out of bed as tolerated with no brace. She will be seen in the office in approximately 2 weeks.    Ozell Ada, MD Orthopedic Surgeon

## 2024-06-27 NOTE — Progress Notes (Signed)
 Orthopedic Surgery Post-operative Progress Note  Assessment: Patient is a 63 y.o. female who is recovering after L4/5 laminectomy   Plan: -Operative plans complete -Drains: none -Out of bed as tolerated, no brace -No bending/lifting/twisting greater than 10 pounds -Pain control -Regular diet -No chemoprophylaxis for dvt or antiplatelets for 72 hours after surgery -Anticipate discharge to home from PACU  ___________________________________________________________________________   Subjective: No acute events since surgery. Recovering in PACU. Pain controlled.   Objective:  General: no acute distress, appropriate affect Neurologic: alert, answering questions appropriately, following commands Respiratory: unlabored breathing on room air Skin: dressing clear/dry/intact  MSK (spine):  -Strength exam      Right  Left  EHL    5/5  5/5 TA    5/5  5/5 GSC    5/5  5/5 Knee extension  5/5  5/5 Hip flexion   5/5  5/5  -Sensory exam    Sensation intact to light touch in L3-S1 nerve distributions of bilateral lower extremities   Yesterday's total administered Morphine Milligram Equivalents: 0   Patient name: [name] Patient MRN: [id number] Date: 06/27/24

## 2024-06-27 NOTE — Anesthesia Preprocedure Evaluation (Addendum)
 Anesthesia Evaluation  Patient identified by MRN, date of birth, ID band Patient awake    Reviewed: Allergy & Precautions, NPO status , Patient's Chart, lab work & pertinent test results  Airway Mallampati: II  TM Distance: >3 FB Neck ROM: Full    Dental no notable dental hx.    Pulmonary neg pulmonary ROS   Pulmonary exam normal        Cardiovascular hypertension, Pt. on medications Normal cardiovascular exam     Neuro/Psych negative neurological ROS  negative psych ROS   GI/Hepatic negative GI ROS, Neg liver ROS,,,  Endo/Other  Hypothyroidism    Renal/GU Renal disease     Musculoskeletal  (+) Arthritis ,    Abdominal  (+) + obese  Peds  Hematology negative hematology ROS (+)   Anesthesia Other Findings LUMBAR RADICULOPATHY  Reproductive/Obstetrics                              Anesthesia Physical Anesthesia Plan  ASA: 3  Anesthesia Plan: General   Post-op Pain Management:    Induction: Intravenous  PONV Risk Score and Plan: 3 and Ondansetron , Dexamethasone , Midazolam  and Treatment may vary due to age or medical condition  Airway Management Planned: Oral ETT  Additional Equipment:   Intra-op Plan:   Post-operative Plan: Extubation in OR  Informed Consent: I have reviewed the patients History and Physical, chart, labs and discussed the procedure including the risks, benefits and alternatives for the proposed anesthesia with the patient or authorized representative who has indicated his/her understanding and acceptance.     Dental advisory given  Plan Discussed with: CRNA  Anesthesia Plan Comments:          Anesthesia Quick Evaluation

## 2024-06-27 NOTE — Discharge Instructions (Signed)
 Orthopedic Surgery Discharge Instructions  Patient name: [name] Procedure Performed: L4/5 laminectomy Date of Surgery: 06/27/2024 Surgeon: Ozell Ada, MD  Pre-operative Diagnosis: lumbar stenosis, lumbar radiculopathy Post-operative Diagnosis: same as above  Discharge Date: 06/27/2024 Discharged to: home Discharge Condition: stable  Activity: You should refrain from bending, lifting, or twisting with objects greater than ten pounds until six weeks after surgery. You are encouraged to walk as much as desired. You can perform household activities such as cleaning dishes, doing laundry, vacuuming, etc. as long as the ten-pound restriction is followed. You do not need to wear a brace during the post-operative period.   Incision Care: Your incision site has a dressing over it. That dressing should remain in place and dry at all times for a total of one week after surgery. After one week, you can remove the dressing. Underneath the dressing, you will find skin glue. You should leave the skin glue in place. It will fall off with time. Do not pick, rub, or scrub at it. Do not put cream or lotion over the surgical area. After one week and once the dressing is off, it is okay to let soap and water run over your incision. Again, do not pick, scrub, or rub at the skin glue when bathing. Do not submerge (e.g., take a bath, swim, go in a hot tub, etc.) until six weeks after surgery. There may be some bloody drainage from the incision into the dressing after surgery. This is normal. You do not need to replace the dressing. Continue to leave it in place for the one week as instructed above. Should the dressing become saturated with blood or drainage, please call the office for further instructions.   Medications: You have been prescribed oxycodone . This is a narcotic pain medication and should only be taken as prescribed. You should not drink alcohol or operate heavy machinery (including driving) while  taking this medication. The oxycodone  can cause constipation as a side effect. For that reason, you have been prescribed senna and miralax . These are both laxatives. You do not need to take this medication if you develop diarrhea. Should you remain constipated even while taking these medications, please increase the dose of miralax  to twice daily. Tylenol  has been prescribed to be taken every 8 hours, which will give you additional pain relief. Robaxin  is a muscle relaxer that has been prescribed to you for muscle spasm type pain. Take this medication as needed.   You can use over-the-counter NSAIDs (ibuprofen, Aleve, Celebrex, naproxen, meloxicam , etc.) for additional pain relief after this surgery starting 72 hours after surgery. These medications are safe to take with the Tylenol  you have been prescribed. You should not take these medications if you have or have had kidney problems or gastrointestinal ulcers. Take these medications as instructed on the packaging.   In order to set expectations for opioid prescriptions, you will only be prescribed opioids for a total of six weeks after surgery and, at two-weeks after surgery, your opioid prescription will start to tapered (decreased dosage and number of pills). If you have ongoing need for opioid medication six weeks after surgery, you will be referred to pain management. If you are already established with a provider that is giving you opioid medications, you should schedule an appointment with them for six weeks after surgery if you feel you are going to need another prescription. State law only allows for opioid prescriptions one week at a time. If you are running out of opioid  medication near the end of the week, please call the office during business hours before running out so I can send you another prescription.   You may resume any home blood thinners (warfarin, lovenox, apixaban, plavix, xarelto, etc) 72 hours after your surgery. Take these  medications as they were previously prescribed.  Driving: You should not drive while taking narcotic pain medications. You should start getting back to driving slowly and you may want to try driving in a parking lot before doing anything more.   Diet: You are safe to resume your regular diet after surgery.   Reasons to Call the Office After Surgery: You should feel free to call the office with any concerns or questions you have in the post-operative period, but you should definitely notify the office if you develop: -shortness of breath, chest pain, or trouble breathing -excessive bleeding, drainage, redness, or swelling around the surgical site -fevers, chills, or pain that is getting worse with each passing day -persistent nausea or vomiting -new weakness in either leg -new or worsening numbness or tingling in either leg -numbness in the groin, bowel or bladder incontinence -other concerns about your surgery  Follow Up Appointments: You should have an office appointment scheduled for approximately two weeks after surgery. If you do not remember when this appointment is or do not already have it scheduled, please call the office to schedule.   Office Information:  -Ozell Ada, MD -Phone number: [phone] -Address: [address]       [place], [place] [address]

## 2024-06-27 NOTE — Brief Op Note (Signed)
 06/27/2024  10:24 AM  PATIENT:  Rollene CROME Randleman  63 y.o. female  PRE-OPERATIVE DIAGNOSIS:  LUMBAR RADICULOPATHY  POST-OPERATIVE DIAGNOSIS:  LUMBAR RADICULOPATHY  PROCEDURE:  Procedure(s): DECOMPRESSIVE LUMBAR FOUR-LUMBAR FIVE LAMINECTOMY (N/A)  SURGEON:  Surgeons and Role:    * Georgina Ozell LABOR, MD - Primary  PHYSICIAN ASSISTANT: none  ASSISTANTS: none   ANESTHESIA:   general  EBL:  150 mL   BLOOD ADMINISTERED:none  DRAINS: none   LOCAL MEDICATIONS USED:  MARCAINE      SPECIMEN:  No Specimen  DISPOSITION OF SPECIMEN:  N/A  COUNTS:  YES  TOURNIQUET: NONE  DICTATION: .Note written in EPIC  PLAN OF CARE: Discharge to home after PACU  PATIENT DISPOSITION:  PACU - hemodynamically stable.   Delay start of Pharmacological VTE agent (>24hrs) due to surgical blood loss or risk of bleeding: yes

## 2024-06-27 NOTE — Anesthesia Procedure Notes (Signed)
 Procedure Name: Intubation Date/Time: 06/27/2024 7:29 AM  Performed by: Worth Catherene Flores, CRNAPre-anesthesia Checklist: Patient identified, Emergency Drugs available, Suction available and Patient being monitored Patient Re-evaluated:Patient Re-evaluated prior to induction Oxygen Delivery Method: Circle system utilized Preoxygenation: Pre-oxygenation with 100% oxygen Induction Type: IV induction Ventilation: Mask ventilation without difficulty Laryngoscope Size: Mac and 3 Grade View: Grade I Tube type: Oral Tube size: 7.0 mm Number of attempts: 1 Airway Equipment and Method: Stylet and Oral airway Placement Confirmation: ETT inserted through vocal cords under direct vision, positive ETCO2 and breath sounds checked- equal and bilateral Secured at: 22 cm Tube secured with: Tape Dental Injury: Teeth and Oropharynx as per pre-operative assessment

## 2024-06-28 ENCOUNTER — Encounter (HOSPITAL_COMMUNITY): Payer: Self-pay | Admitting: Orthopedic Surgery

## 2024-06-28 DIAGNOSIS — M5416 Radiculopathy, lumbar region: Secondary | ICD-10-CM

## 2024-07-10 ENCOUNTER — Ambulatory Visit (INDEPENDENT_AMBULATORY_CARE_PROVIDER_SITE_OTHER): Admitting: Orthopedic Surgery

## 2024-07-10 DIAGNOSIS — Z9889 Other specified postprocedural states: Secondary | ICD-10-CM

## 2024-07-10 NOTE — Progress Notes (Signed)
 Orthopedic Surgery Post-operative Office Visit  Procedure: L4/5 laminectomy Date of Surgery: 06/27/2024 (~2 weeks post-op)  Assessment: Patient is a 63 y.o. who is doing well after surgery   Plan: -Operative plans complete -Out of bed as tolerated, no brace -No bending/lifting/twisting greater than 10 pounds -Okay to let soap/water run over the incision but do not submerge -Pain management: tylenol  as needed -Return to office in 4 weeks, x-rays needed at next visit: none  ___________________________________________________________________________   Subjective: Patient feels that her radiating leg pain is gone significant better since surgery.  She does notice some calf pain if she is more active.  She is not sure if this is more related to muscle soreness from being inactive for so long prior to surgery.  She does not have the same resting radiating leg pain that she had prior to surgery and she does not feel that her pain is there if she does light activity.  She has not noticed any redness or drainage around her incision.  She is overall pleased with how she is doing after the surgery.  Objective:  General: no acute distress, appropriate affect Neurologic: alert, answering questions appropriately, following commands Respiratory: unlabored breathing on room air Skin: incision is well approximated with no erythema, induration, active/expressible drainage  MSK (spine):  -Strength exam      Left  Right  EHL    5/5  5/5 TA    5/5  5/5 GSC    5/5  5/5 Knee extension  5/5  5/5 Hip flexion   5/5  5/5  -Sensory exam    Sensation intact to light touch in L3-S1 nerve distributions of bilateral lower extremities  Imaging: None obtained at today's visit   Patient name: [name] Patient MRN: [id number] Date of visit: 07/10/24

## 2024-07-12 ENCOUNTER — Ambulatory Visit (INDEPENDENT_AMBULATORY_CARE_PROVIDER_SITE_OTHER): Admitting: Adult Health

## 2024-07-12 ENCOUNTER — Other Ambulatory Visit: Payer: Self-pay | Admitting: Family Medicine

## 2024-07-12 ENCOUNTER — Encounter (INDEPENDENT_AMBULATORY_CARE_PROVIDER_SITE_OTHER): Payer: Self-pay

## 2024-07-12 ENCOUNTER — Encounter (INDEPENDENT_AMBULATORY_CARE_PROVIDER_SITE_OTHER): Payer: Self-pay | Admitting: Adult Health

## 2024-07-12 VITALS — BP 114/71 | HR 96 | Temp 98.2°F | Ht 60.0 in | Wt 167.0 lb

## 2024-07-12 DIAGNOSIS — R7303 Prediabetes: Secondary | ICD-10-CM

## 2024-07-12 DIAGNOSIS — E559 Vitamin D deficiency, unspecified: Secondary | ICD-10-CM | POA: Diagnosis not present

## 2024-07-12 DIAGNOSIS — I10 Essential (primary) hypertension: Secondary | ICD-10-CM

## 2024-07-12 DIAGNOSIS — E66811 Obesity, class 1: Secondary | ICD-10-CM

## 2024-07-12 DIAGNOSIS — E669 Obesity, unspecified: Secondary | ICD-10-CM

## 2024-07-12 DIAGNOSIS — M545 Low back pain, unspecified: Secondary | ICD-10-CM

## 2024-07-12 DIAGNOSIS — Z6832 Body mass index (BMI) 32.0-32.9, adult: Secondary | ICD-10-CM

## 2024-07-12 DIAGNOSIS — E039 Hypothyroidism, unspecified: Secondary | ICD-10-CM

## 2024-07-12 MED ORDER — VITAMIN D (ERGOCALCIFEROL) 1.25 MG (50000 UNIT) PO CAPS: 50000.0000 [IU] | ORAL_CAPSULE | ORAL | 0 refills | Status: DC

## 2024-07-12 NOTE — Progress Notes (Signed)
 WEIGHT SUMMARY AND BIOMETRICS  Vitals Temp: 98.2 F (36.8 C) BP: 114/71 Pulse Rate: 96 SpO2: 97 %   Anthropometric Measurements Height: 5' (1.524 m) Weight: 167 lb (75.8 kg) BMI (Calculated): 32.62 Weight at Last Visit: 169 lb Weight Lost Since Last Visit: 2 lb Weight Gained Since Last Visit: 0 Starting Weight: 177 lb Total Weight Loss (lbs): 10 lb (4.536 kg)   Body Composition  Body Fat %: 40.4 % Fat Mass (lbs): 67.8 lbs Muscle Mass (lbs): 94.8 lbs Total Body Water (lbs): 69.6 lbs Visceral Fat Rating : 11   Other Clinical Data Fasting: no Labs: no Today's Visit #: 30 Starting Date: 04/03/20    Chief Complaint:   OBESITY [name] is here to discuss her progress with her obesity treatment plan.  She is on the keeping a food journal and adhering to recommended goals of 1200 calories and 90g+ protein and states she is following her eating plan approximately 0 % of the time.  She states she is exercising: No, s/p   Interim History:  06/27/2024 DECOMPRESSIVE LUMBAR FOUR-LUMBAR FIVE LAMINECTOMY   Current limitations: Cannot submerge herself in water, ie: swimming, hot tubs, and tub baths Cannot lift > 10 lbs  The first week after Laminectomy , she slept most hours of the day. She only used narcotics for 48 hrs after procedure, then changed to Acetaminophen  for pain control.  She endorses low appetite since surgery  She has been hydrating well and her husband has been helpful during her recovery.  Lumbar back pain with radiation prior to laminectomy was rated 8/10, described as throbbing/shooting and pain was constant Current pain level: none at rest, only minimal twinge with movement.  Subjective:   1. Low back pain, unspecified back pain laterality, unspecified chronicity, unspecified whether sciatica present  Orthopedic Spine Surgery H&P Note 06/27/2024   Assessment: Patient is a 63 y.o. female with lumbar radiculopathy     Plan: -Out of bed  as tolerated, activity as tolerated, no brace -Covered the risks of surgery one more time with the patient and patient elected to proceed with planned surgery -Written consent verified -Hold anticoagulation in anticipation of surgery -Ancef  and TXA on all to OR -NPO for procedure -Site marked -To OR when ready     The risks covered this morning included but were not limited to: iatrogenic instability, DVT/PE, dural tear, nerve root injury, paralysis, persistent pain, infection, bleeding, heart attack, death, fracture, and need for additional procedures. ___________________________________________________________________________   Chief Complaint: Low back pain that radiates into the bilateral posterolateral thighs   History: Patient is 63 y.o. female who has been previously seen in the office for low back pain that radiates into her bilateral posterolateral thighs.  Workup was consistent with lumbar radiculopathy.  Her symptoms failed to improve with conservative treatment so operative management was discussed at the last office visit. The patient presents today with no changes in her symptoms since the last office visit. See previous office note for further details.        2. Pre-diabetes Lab Results  Component Value Date   HGBA1C 5.6 02/21/2024   HGBA1C 5.9 (H) 11/08/2023   HGBA1C 5.9 (H) 05/04/2023     Latest Reference Range & Units 06/15/24 08:40  GFR, Estimated >60 mL/min >60   She stopped Metformin  XR 750mg  TID the first week after surgery, then restarted last week and experienced loose stools. She has again stopped and remained off. Disks the risks/benefits of Metformin  and that  it is being used off label for prediabetes and weight loss- okay to remain off for the time being  3. Essential hypertension BP at goal at OV PCP manages  enalapril  (VASOTEC ) 20 MG tablet   4. Vitamin D  deficiency  Latest Reference Range & Units 05/04/23 09:49 11/08/23 09:15 02/21/24 11:00   Vitamin D , 25-Hydroxy 30.0 - 100.0 ng/mL 40.7 50.7 81.6   She is on bi-weekly Ergocalciferol - denies N/V/Muscle Weakness  Assessment/Plan:   1. Low back pain, unspecified back pain laterality, unspecified chronicity, unspecified whether sciatica present Continue post op instructions per Surgeon and f/u next week  2. Pre-diabetes (Primary) Remain off Metformin    3. Essential hypertension Limit Na+ Remain well hydrated with water and relax s/p laminectomy  4. Vitamin D  deficiency Refill Vitamin D , Ergocalciferol , (DRISDOL ) 1.25 MG (50000 UNIT) CAPS capsule Take 1 capsule (50,000 Units total) by mouth every 14 (fourteen) days. Dispense: 4 capsule, Refills: 0 ordered   5. Obesity (BMI 30.0-34.9), CURRENT BMI 32.7  [name] is not currently in the action stage of change. As such, her goal is to maintain weight for now. She has agreed to 90g+.   Exercise goals: Activity as tolerated  Behavioral modification strategies: increasing lean protein intake, decreasing simple carbohydrates, increasing vegetables, increasing water intake, no skipping meals, meal planning and cooking strategies, keeping healthy foods in the home, ways to avoid boredom eating, planning for success, and decreasing junk food.  [name] has agreed to follow-up with our clinic in 4 weeks. She was informed of the importance of frequent follow-up visits to maximize her success with intensive lifestyle modifications for her multiple health conditions.   Objective:   Blood pressure 114/71, pulse 96, temperature 98.2 F (36.8 C), height 5' (1.524 m), weight 167 lb (75.8 kg), SpO2 97%. Body mass index is 32.61 kg/m.  General: Cooperative, alert, well developed, in no acute distress. HEENT: Conjunctivae and lids unremarkable. Cardiovascular: Regular rhythm.  Lungs: Normal work of breathing. Neurologic: No focal deficits.   Lab Results  Component Value Date   CREATININE 0.88 06/15/2024   BUN 24 (H) 06/15/2024    NA 138 06/15/2024   K 4.3 06/15/2024   CL 105 06/15/2024   CO2 25 06/15/2024   Lab Results  Component Value Date   ALT 17 02/21/2024   AST 18 02/21/2024   ALKPHOS 59 02/21/2024   BILITOT 0.4 02/21/2024   Lab Results  Component Value Date   HGBA1C 5.6 02/21/2024   HGBA1C 5.9 (H) 11/08/2023   HGBA1C 5.9 (H) 05/04/2023   HGBA1C 5.6 12/16/2022   HGBA1C 5.6 09/09/2022   Lab Results  Component Value Date   INSULIN  5.4 02/21/2024   INSULIN  14.8 11/08/2023   INSULIN  8.8 05/04/2023   INSULIN  6.5 12/16/2022   INSULIN  6.5 09/09/2022   Lab Results  Component Value Date   TSH 1.20 12/30/2023   Lab Results  Component Value Date   CHOL 259 (H) 09/09/2022   HDL 62 09/09/2022   LDLCALC 145 (H) 09/09/2022   TRIG 287 (H) 09/09/2022   CHOLHDL 4.2 09/09/2022   Lab Results  Component Value Date   VD25OH 81.6 02/21/2024   VD25OH 50.7 11/08/2023   VD25OH 40.7 05/04/2023   Lab Results  Component Value Date   WBC 7.9 06/15/2024   HGB 15.8 (H) 06/15/2024   HCT 46.2 (H) 06/15/2024   MCV 94.5 06/15/2024   PLT 368 06/15/2024   No results found for: IRON, TIBC, FERRITIN  Attestation Statements:   Reviewed by clinician  on day of visit: allergies, medications, problem list, medical history, surgical history, family history, social history, and previous encounter notes.  I have reviewed the above documentation for accuracy and completeness, and I agree with the above. -  Deanna Goga d. Burrell Hodapp, NP-C

## 2024-07-13 ENCOUNTER — Other Ambulatory Visit: Payer: Self-pay | Admitting: Family Medicine

## 2024-07-13 DIAGNOSIS — I10 Essential (primary) hypertension: Secondary | ICD-10-CM

## 2024-08-09 ENCOUNTER — Ambulatory Visit (INDEPENDENT_AMBULATORY_CARE_PROVIDER_SITE_OTHER): Admitting: Adult Health

## 2024-08-09 ENCOUNTER — Encounter (INDEPENDENT_AMBULATORY_CARE_PROVIDER_SITE_OTHER): Payer: Self-pay | Admitting: Adult Health

## 2024-08-09 VITALS — BP 132/84 | HR 71 | Temp 98.3°F | Ht 60.0 in | Wt 170.0 lb

## 2024-08-09 DIAGNOSIS — R7303 Prediabetes: Secondary | ICD-10-CM

## 2024-08-09 DIAGNOSIS — E559 Vitamin D deficiency, unspecified: Secondary | ICD-10-CM | POA: Diagnosis not present

## 2024-08-09 DIAGNOSIS — E66811 Obesity, class 1: Secondary | ICD-10-CM

## 2024-08-09 DIAGNOSIS — I10 Essential (primary) hypertension: Secondary | ICD-10-CM

## 2024-08-09 DIAGNOSIS — Z6833 Body mass index (BMI) 33.0-33.9, adult: Secondary | ICD-10-CM

## 2024-08-09 DIAGNOSIS — M545 Low back pain, unspecified: Secondary | ICD-10-CM | POA: Diagnosis not present

## 2024-08-09 MED ORDER — METFORMIN HCL ER 500 MG PO TB24: ORAL_TABLET | ORAL | 0 refills | Status: DC

## 2024-08-09 NOTE — Progress Notes (Signed)
 WEIGHT SUMMARY AND BIOMETRICS  Vitals Temp: 98.3 F (36.8 C) BP: 132/84 Pulse Rate: 71 SpO2: 93 %   Anthropometric Measurements Height: 5' (1.524 m) Weight: 170 lb (77.1 kg) BMI (Calculated): 33.2 Weight at Last Visit: 167 lb Weight Lost Since Last Visit: 0 Weight Gained Since Last Visit: 3 lb Starting Weight: 177 lb Total Weight Loss (lbs): 7 lb (3.175 kg)   Body Composition  Body Fat %: 39.5 % Fat Mass (lbs): 67.4 lbs Muscle Mass (lbs): 98 lbs Total Body Water (lbs): 68.4 lbs Visceral Fat Rating : 11   Other Clinical Data Fasting: no Labs: no Today's Visit #: 31 Starting Date: 04/03/20    Chief Complaint:   OBESITY [name] is here to discuss her progress with her obesity treatment plan.  She is on the keeping a food journal and adhering to recommended goals of 1200 calories and 90g+ protein and states she is following her eating plan approximately 50 % of the time.  She states she is exercising Walking 20 minutes 3 times per week.  Interim History:  She stopped Metformin  XR 750mg  TID the first week after surgery, then restarted last week and experienced loose stools. She has again stopped and remained off since early August 2025 She would like to restart- discussed dosing  Exercise-she has been slowly increasing walking and overall daily activity She has f/u with established Orthopedic Care Team next week  Subjective:   1. Pre-diabetes She stopped Metformin  XR 750mg  TID the first week after surgery, then restarted last week and experienced loose stools. She has again stopped and remained off since early August 2025 She would like to restart- discussed dosing   Latest Reference Range & Units 06/15/24 08:40  GFR, Estimated >60 mL/min >60   Lab Results  Component Value Date   HGBA1C 5.6 02/21/2024   HGBA1C 5.9 (H) 11/08/2023   HGBA1C 5.9 (H) 05/04/2023    2. Vitamin D  deficiency  Latest Reference Range & Units 05/04/23 09:49 11/08/23 09:15  02/21/24 11:00  Vitamin D , 25-Hydroxy 30.0 - 100.0 ng/mL 40.7 50.7 81.6   Continue bi-weekly Ergocalciferol  She denies N/V/Muscle Weakness  3. Essential hypertension BP stable at OV She denies CP with exertion She is on daily  enalapril  (VASOTEC ) 20 MG tablet   4. Low back pain, unspecified back pain laterality, unspecified chronicity, unspecified whether sciatica present Orthopedic Spine Surgery H&P Note 06/27/2024   Assessment: Patient is a 63 y.o. female with lumbar radiculopathy Plan: -Out of bed as tolerated, activity as tolerated, no brace -Covered the risks of surgery one more time with the patient and patient elected to proceed with planned surgery -Written consent verified -Hold anticoagulation in anticipation of surgery -Ancef  and TXA on all to OR -NPO for procedure -Site marked -To OR when ready The risks covered this morning included but were not limited to: iatrogenic instability, DVT/PE, dural tear, nerve root injury, paralysis, persistent pain, infection, bleeding, heart attack, death, fracture, and need for additional procedures. ___________________________________________________________________________ Chief Complaint: Low back pain that radiates into the bilateral posterolateral thighs History: Patient is 63 y.o. female who has been previously seen in the office for low back pain that radiates into her bilateral posterolateral thighs.  Workup was consistent with lumbar radiculopathy.  Her symptoms failed to improve with conservative treatment so operative management was discussed at the last office visit. The patient presents today with no changes in her symptoms since the last office visit. See previous office note for further details.  Assessment/Plan:   1. Pre-diabetes Restart  metFORMIN  (GLUCOPHAGE -XR) 500 MG 24 hr tablet 1 tab daily with meal for two weeks, then increase 1 tab twice daily with meals- hold at this dose Dispense: 90 tablet, Refills: 0 ordered    2. Vitamin D  deficiency Continue bi-weekly Ergocalciferol - denies need for refill  3. Essential hypertension (Primary) Limit Na+ Continue healthy eating and increase walking as tolerated Continue enalapril  (VASOTEC ) 20 MG tablet    4. Low back pain, unspecified back pain laterality, unspecified chronicity, unspecified whether sciatica present Continue to increase walking as tolerated F/u with Orthopedic Care Team as directed and as needed  5. Obesity (BMI 30.0-34.9), CURRENT BMI 33.3  [name] is currently in the action stage of change. As such, her goal is to continue with weight loss efforts. She has agreed to keeping a food journal and adhering to recommended goals of 1200 calories and 90g+ protein.   Exercise goals: All adults should avoid inactivity. Some physical activity is better than none, and adults who participate in any amount of physical activity gain some health benefits. Adults should also include muscle-strengthening activities that involve all major muscle groups on 2 or more days a week.  Behavioral modification strategies: increasing lean protein intake, decreasing simple carbohydrates, increasing vegetables, increasing water intake, meal planning and cooking strategies, keeping healthy foods in the home, ways to avoid boredom eating, and planning for success.  [name] has agreed to follow-up with our clinic in 4 weeks. She was informed of the importance of frequent follow-up visits to maximize her success with intensive lifestyle modifications for her multiple health conditions.   Objective:   Blood pressure 132/84, pulse 71, temperature 98.3 F (36.8 C), height 5' (1.524 m), weight 170 lb (77.1 kg), SpO2 93%. Body mass index is 33.2 kg/m.  General: Cooperative, alert, well developed, in no acute distress. HEENT: Conjunctivae and lids unremarkable. Cardiovascular: Regular rhythm.  Lungs: Normal work of breathing. Neurologic: No focal deficits.   Lab  Results  Component Value Date   CREATININE 0.88 06/15/2024   BUN 24 (H) 06/15/2024   NA 138 06/15/2024   K 4.3 06/15/2024   CL 105 06/15/2024   CO2 25 06/15/2024   Lab Results  Component Value Date   ALT 17 02/21/2024   AST 18 02/21/2024   ALKPHOS 59 02/21/2024   BILITOT 0.4 02/21/2024   Lab Results  Component Value Date   HGBA1C 5.6 02/21/2024   HGBA1C 5.9 (H) 11/08/2023   HGBA1C 5.9 (H) 05/04/2023   HGBA1C 5.6 12/16/2022   HGBA1C 5.6 09/09/2022   Lab Results  Component Value Date   INSULIN  5.4 02/21/2024   INSULIN  14.8 11/08/2023   INSULIN  8.8 05/04/2023   INSULIN  6.5 12/16/2022   INSULIN  6.5 09/09/2022   Lab Results  Component Value Date   TSH 1.20 12/30/2023   Lab Results  Component Value Date   CHOL 259 (H) 09/09/2022   HDL 62 09/09/2022   LDLCALC 145 (H) 09/09/2022   TRIG 287 (H) 09/09/2022   CHOLHDL 4.2 09/09/2022   Lab Results  Component Value Date   VD25OH 81.6 02/21/2024   VD25OH 50.7 11/08/2023   VD25OH 40.7 05/04/2023   Lab Results  Component Value Date   WBC 7.9 06/15/2024   HGB 15.8 (H) 06/15/2024   HCT 46.2 (H) 06/15/2024   MCV 94.5 06/15/2024   PLT 368 06/15/2024   No results found for: IRON, TIBC, FERRITIN  Attestation Statements:   Reviewed by clinician on day of visit:  allergies, medications, problem list, medical history, surgical history, family history, social history, and previous encounter notes.  I have reviewed the above documentation for accuracy and completeness, and I agree with the above. -  Daekwon Beswick d. Prentiss Polio, NP-C

## 2024-08-14 ENCOUNTER — Ambulatory Visit (INDEPENDENT_AMBULATORY_CARE_PROVIDER_SITE_OTHER): Admitting: Orthopedic Surgery

## 2024-08-14 DIAGNOSIS — Z9889 Other specified postprocedural states: Secondary | ICD-10-CM

## 2024-08-14 NOTE — Progress Notes (Signed)
 Orthopedic Surgery Post-operative Office Visit   Procedure: L4/5 laminectomy Date of Surgery: 06/27/2024 (~6 weeks post-op)   Assessment: Patient is a 63 y.o. who is doing well after surgery     Plan: -No spine specific precautions at this time -Okay to submerge the wound at this point, can return to her pool exercises -Pain management: tylenol  as needed -Return to office in 6 weeks, x-rays needed at next visit: AP/lateral/flex/ex   ___________________________________________________________________________     Subjective: Patient is pleased with how she is doing so far since surgery.  She sometimes notices some calf pain if she is sitting or resting for a while but it gets better if she moves.  She is not having as significant of leg pain as she was prior to surgery.  She is having some soreness in her back but it is improved from preop as well.  She has not noticed any redness or drainage around her incision.  She is just taking Tylenol  for pain at this point.    Objective:   General: no acute distress, appropriate affect Neurologic: alert, answering questions appropriately, following commands Respiratory: unlabored breathing on room air Skin: incision is well healed with no erythema, induration, active/expressible drainage   MSK (spine):   -Strength exam                                                   Left                  Right   EHL                              5/5                  5/5 TA                                 5/5                  5/5 GSC                             5/5                  5/5 Knee extension            5/5                  5/5 Hip flexion                    5/5                  5/5   -Sensory exam                           Sensation intact to light touch in L3-S1 nerve distributions of bilateral lower extremities   Imaging: None obtained at today's visit     Patient name: [name] Patient MRN: [id number] Date of visit: 08/14/24

## 2024-09-05 ENCOUNTER — Ambulatory Visit (INDEPENDENT_AMBULATORY_CARE_PROVIDER_SITE_OTHER): Admitting: Adult Health

## 2024-09-05 ENCOUNTER — Encounter (INDEPENDENT_AMBULATORY_CARE_PROVIDER_SITE_OTHER): Payer: Self-pay | Admitting: Adult Health

## 2024-09-05 VITALS — BP 126/76 | HR 69 | Temp 98.5°F | Ht 60.0 in | Wt 168.0 lb

## 2024-09-05 DIAGNOSIS — E559 Vitamin D deficiency, unspecified: Secondary | ICD-10-CM

## 2024-09-05 DIAGNOSIS — R7303 Prediabetes: Secondary | ICD-10-CM

## 2024-09-05 DIAGNOSIS — M2350 Chronic instability of knee, unspecified knee: Secondary | ICD-10-CM | POA: Diagnosis not present

## 2024-09-05 DIAGNOSIS — E66811 Obesity, class 1: Secondary | ICD-10-CM

## 2024-09-05 DIAGNOSIS — R632 Polyphagia: Secondary | ICD-10-CM | POA: Diagnosis not present

## 2024-09-05 DIAGNOSIS — Z6832 Body mass index (BMI) 32.0-32.9, adult: Secondary | ICD-10-CM

## 2024-09-05 DIAGNOSIS — E669 Obesity, unspecified: Secondary | ICD-10-CM

## 2024-09-05 MED ORDER — VITAMIN D (ERGOCALCIFEROL) 1.25 MG (50000 UNIT) PO CAPS
50000.0000 [IU] | ORAL_CAPSULE | ORAL | 0 refills | Status: AC
Start: 2024-09-05 — End: ?

## 2024-09-05 MED ORDER — METFORMIN HCL ER 500 MG PO TB24: ORAL_TABLET | ORAL | 0 refills | Status: DC

## 2024-09-05 NOTE — Progress Notes (Signed)
 WEIGHT SUMMARY AND BIOMETRICS  Vitals Temp: 98.5 F (36.9 C) BP: 126/76 Pulse Rate: 69 SpO2: 95 %   Anthropometric Measurements Height: 5' (1.524 m) Weight: 168 lb (76.2 kg) BMI (Calculated): 32.81 Weight at Last Visit: 170 lb Weight Lost Since Last Visit: 2 lb Weight Gained Since Last Visit: 0 lb Starting Weight: 177 lb Total Weight Loss (lbs): 9 lb (4.082 kg)   Body Composition  Body Fat %: 40.2 % Fat Mass (lbs): 67.8 lbs Muscle Mass (lbs): 95.6 lbs Total Body Water (lbs): 68.2 lbs Visceral Fat Rating : 11   Other Clinical Data Fasting: no Labs: no Today's Visit #: 32 Starting Date: 04/03/20    Chief Complaint:   OBESITY [name] is here to discuss her progress with her obesity treatment plan.  She is on the keeping a food journal and adhering to recommended goals of 1100 calories and 90g+ protein and states she is following her eating plan approximately 90% of the time.  She states she is exercising Swimming 30 minutes 4 times per week.  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  Since her back pain has greatly reduced, she has been increasing regular exercise (swimming)  Subjective:   1. Pre-diabetes Lab Results  Component Value Date   HGBA1C 5.6 02/21/2024   HGBA1C 5.9 (H) 11/08/2023   HGBA1C 5.9 (H) 05/04/2023    She reports increased polyphagia and subsequent snacking late evenings. She is on Metformin  XR 500mg - 1 tab breakfast and 1 tab dinner. Discussed adding an additional Metformin  XR 500mg  with evening meal to control post dinner appetite.  2. Vitamin D  deficiency She is on Ergocalciferol - denies N/V/Muscle Weakness  3. Polyphagia She reports increased polyphagia and subsequent snacking late  evenings. She is on Metformin  XR 500mg - 1 tab breakfast and 1 tab dinner. Discussed adding an additional Metformin  XR 500mg  with evening meal to control post dinner appetite.  4. Recurrent instability of knee joint, unspecified laterality Ms. Deshazer reports bilateral knee pain and instability, especially when climbing stairs. She has meet her out of pocket deductible and is interested in PT sessions. She denies need for referral as she has an established PT.  Assessment/Plan:   1. Pre-diabetes (Primary) Refill and INCREASE  metFORMIN  (GLUCOPHAGE -XR) 500 MG 24 hr tablet 1 tab with breakfast and 2 tabs with dinner Dispense: 90 tablet, Refills: 0 ordered   2. Vitamin D  deficiency Refill   Vitamin D , Ergocalciferol , (DRISDOL ) 1.25 MG (50000 UNIT) CAPS capsule Take 1 capsule (50,000 Units total) by mouth every 14 (fourteen) days. Dispense: 4 capsule, Refills: 0 ordered   3. Polyphagia Refill and INCREASE  metFORMIN  (GLUCOPHAGE -XR) 500 MG 24 hr tablet 1 tab with breakfast and 2 tabs with dinner Dispense: 90 tablet, Refills: 0 ordered    4. Recurrent instability of knee joint, unspecified laterality Contact established Physical Therapist and inquire about starting therapy for bil knee pain and instability  5. Obesity (BMI 30.0-34.9), CURRENT BMI 32.9  [name] is currently in the action stage of change. As such, her goal is to continue with weight loss efforts. She has agreed to keeping a food journal and adhering to recommended goals of 1100 calories and 90g+ protein.   Exercise goals: All adults should avoid inactivity. Some physical activity  is better than none, and adults who participate in any amount of physical activity gain some health benefits. Adults should also include muscle-strengthening activities that involve all major muscle groups on 2 or more days a week. Add in Strength Training at least once per week.  Behavioral modification strategies: increasing lean protein intake,  decreasing simple carbohydrates, increasing vegetables, increasing water intake, no skipping meals, meal planning and cooking strategies, keeping healthy foods in the home, ways to avoid boredom eating, and planning for success.  Arlana has agreed to follow-up with our clinic in 2 weeks. She was informed of the importance of frequent follow-up visits to maximize her success with intensive lifestyle modifications for her multiple health conditions.   Check IC in 4 week   Objective:   Blood pressure 126/76, pulse 69, temperature 98.5 F (36.9 C), height 5' (1.524 m), weight 168 lb (76.2 kg), SpO2 95%. Body mass index is 32.81 kg/m.  General: Cooperative, alert, well developed, in no acute distress. HEENT: Conjunctivae and lids unremarkable. Cardiovascular: Regular rhythm.  Lungs: Normal work of breathing. Neurologic: No focal deficits.   Lab Results  Component Value Date   CREATININE 0.88 06/15/2024   BUN 24 (H) 06/15/2024   NA 138 06/15/2024   K 4.3 06/15/2024   CL 105 06/15/2024   CO2 25 06/15/2024   Lab Results  Component Value Date   ALT 17 02/21/2024   AST 18 02/21/2024   ALKPHOS 59 02/21/2024   BILITOT 0.4 02/21/2024   Lab Results  Component Value Date   HGBA1C 5.6 02/21/2024   HGBA1C 5.9 (H) 11/08/2023   HGBA1C 5.9 (H) 05/04/2023   HGBA1C 5.6 12/16/2022   HGBA1C 5.6 09/09/2022   Lab Results  Component Value Date   INSULIN  5.4 02/21/2024   INSULIN  14.8 11/08/2023   INSULIN  8.8 05/04/2023   INSULIN  6.5 12/16/2022   INSULIN  6.5 09/09/2022   Lab Results  Component Value Date   TSH 1.20 12/30/2023   Lab Results  Component Value Date   CHOL 259 (H) 09/09/2022   HDL 62 09/09/2022   LDLCALC 145 (H) 09/09/2022   TRIG 287 (H) 09/09/2022   CHOLHDL 4.2 09/09/2022   Lab Results  Component Value Date   VD25OH 81.6 02/21/2024   VD25OH 50.7 11/08/2023   VD25OH 40.7 05/04/2023   Lab Results  Component Value Date   WBC 7.9 06/15/2024   HGB 15.8 (H)  06/15/2024   HCT 46.2 (H) 06/15/2024   MCV 94.5 06/15/2024   PLT 368 06/15/2024   No results found for: IRON, TIBC, FERRITIN  Attestation Statements:   Reviewed by clinician on day of visit: allergies, medications, problem list, medical history, surgical history, family history, social history, and previous encounter notes.  I have reviewed the above documentation for accuracy and completeness, and I agree with the above. -  Nickson Middlesworth d. Kaige Whistler, NP-C

## 2024-09-18 ENCOUNTER — Encounter: Payer: Self-pay | Admitting: Radiology

## 2024-09-18 ENCOUNTER — Ambulatory Visit (INDEPENDENT_AMBULATORY_CARE_PROVIDER_SITE_OTHER): Admitting: Family Medicine

## 2024-09-18 ENCOUNTER — Encounter: Payer: Self-pay | Admitting: Family Medicine

## 2024-09-18 VITALS — BP 122/78 | HR 81 | Temp 98.8°F | Ht 60.0 in | Wt 171.0 lb

## 2024-09-18 DIAGNOSIS — Z23 Encounter for immunization: Secondary | ICD-10-CM | POA: Diagnosis not present

## 2024-09-18 DIAGNOSIS — D1724 Benign lipomatous neoplasm of skin and subcutaneous tissue of left leg: Secondary | ICD-10-CM

## 2024-09-18 NOTE — Patient Instructions (Addendum)
 I agree, I think the area under the skin appears to be a lipoma or benign condition, especially without any changes.  I do recommend follow-up with dermatology and make sure they agree.  Increased size, redness, or other new symptoms happy to see you prior to seeing dermatology but I do not expect that to happen.  No new vaccine today.  You should have enough Synthroid  until follow-up for physical in the next 3 months but let me know if other refills needed prior to that time.  Take care!

## 2024-09-18 NOTE — Progress Notes (Signed)
 Subjective:  Patient ID: [name], female    DOB: 08/06/1961  Age: 63 y.o. MRN: [id number]  CC:  Chief Complaint  Patient presents with   Mass    Notes a lump on her Lt thigh, notes she lays on her side to sleep and she has noted this and isn't painful or bothersome but wanted to have it checked over, pt notes it has been present for apx 1 year no significant change in size.    Immunizations    Pt would like to discuss getting pneumonia vaccine     HPI [name] presents for   Acute visit for above  Lump on left buttocks Noted for approximately 1 year without significant change in size but notices when laying on her side to sleep.  Lower left buttock/thigh area. No other areas involved. No pain, no redness, no discharge.  No prior treatment.  Derm - Dr. Rolan Molt, no recent visit. Few yrs ago. FH of skin CA.    Health maintenance Discussed pneumonia vaccine - agrees to today.  COVID-vaccine booster and flu vaccine were given at [facility].   Last physical with me in June 2024.  She is followed by healthy weight and wellness, with follow-up in next few weeks and fasting labs at that time.  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   Anemia    Back pain    Blood transfusion without reported diagnosis    post-operatively in 20s after L1 fracture with spinal cord injury after horseback riding.    Chronic kidney disease    Tuberous Sclerosis; [facility] at [facility].   Edema of both lower extremities    Hyperlipidemia    Hypertension    Hypothyroidism    Lower back pain    Osteoarthritis    bilateral knees   Pre-diabetes    Thyroid  disease    Vitamin D  deficiency    Past Surgical History:  Procedure Laterality Date   ABDOMINAL HYSTERECTOMY     DUB; ovaries remaining.   CESAREAN SECTION     DECOMPRESSIVE LUMBAR LAMINECTOMY LEVEL 1 N/A 06/27/2024   Procedure: DECOMPRESSIVE LUMBAR FOUR-LUMBAR FIVE LAMINECTOMY;  Surgeon: Georgina Ozell LABOR, MD;  Location: [facility] OR;  Service: Orthopedics;  Laterality: N/A;   MENISCUS REPAIR     Sleep Study  12/17/2010   No sleep disordered breathing or periodic limb mvmets.  Excess light sleep.   SPINE SURGERY     L1 fracture with spinal cord injury with horseback riding.   TOTAL VAGINAL HYSTERECTOMY  2008   TUBAL LIGATION     Allergies  Allergen Reactions   Dust Mite Mixed Allergen Ext [Mite (D. Farinae)]     Allergy testing   Saxenda  [Liraglutide  -Weight Management] Nausea And Vomiting    With borderline high lipase - concern for possible pancreatitis.    Sulfa Antibiotics Hives   Lipitor [Atorvastatin ] Other (See Comments)    Fatigue  and confusion   Prior to Admission medications   Medication Sig Start Date End Date Taking? Authorizing Provider  enalapril  (VASOTEC ) 20 MG tablet TAKE 1 TABLET BY MOUTH EVERY DAY 07/13/24  Yes Levora Reyes SAUNDERS, MD  levothyroxine  (SYNTHROID ) 88 MCG tablet TAKE 1 TABLET BY MOUTH EVERY DAY 07/12/24  Yes Levora Reyes SAUNDERS, MD  meloxicam  (MOBIC ) 7.5 MG tablet Take 1 tablet (7.5 mg total) by mouth daily as needed for pain. 05/11/23  Yes Jule Ronal CROME, PA-C  metFORMIN  (GLUCOPHAGE -XR) 500 MG 24 hr tablet 1 tab with breakfast and 2 tabs with dinner 09/05/24  Yes Danford, Katy D, NP  Multiple Vitamin (MULTIVITAMIN WITH MINERALS) TABS tablet Take 1 tablet by mouth daily.   Yes [provider]  Vitamin D , Ergocalciferol ,  (DRISDOL ) 1.25 MG (50000 UNIT) CAPS capsule Take 1 capsule (50,000 Units total) by mouth every 14 (fourteen) days. 09/05/24  Yes Danford, Rockie BIRCH, NP   Social History   Socioeconomic History   Marital status: Married    Spouse name: Elena   Number of children: Not on file   Years of education: Not on file   Highest education level: Master's degree (e.g., MA, MS, MEng, MEd, MSW, MBA)  Occupational History   Occupation: Tourist Information Centre Manager  Tobacco Use   Smoking status: Never   Smokeless tobacco: Never  Vaping Use   Vaping status: Never Used  Substance and Sexual Activity   Alcohol use: Yes    Comment: occ   Drug use: No   Sexual activity: Yes    Birth control/protection: None  Other Topics Concern   Not on file  Social History Narrative   Marital status: married x 20+ years; happily married.      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      Tobacco; none      Alcohol:  Socially      Drugs: none      Exercise: sporadic   Social Drivers of Corporate Investment Banker Strain: Low Risk  (09/17/2024)   Overall Financial Resource Strain (CARDIA)    Difficulty of Paying Living Expenses: Not hard at all  Food Insecurity: No Food Insecurity (09/17/2024)   Hunger Vital Sign    Worried About Running Out of Food in the Last Year: Never true    Ran Out of Food in the Last Year: Never true  Transportation Needs: No Transportation Needs (09/17/2024)   PRAPARE - Administrator, Civil Service (Medical): No    Lack of Transportation (Non-Medical): No  Physical Activity: Insufficiently Active (09/17/2024)   Exercise Vital Sign    Days of Exercise per Week: 4 days    Minutes of Exercise per Session: 30 min  Stress: No Stress Concern Present (09/17/2024)   [employer] of Occupational Health - Occupational Stress Questionnaire    Feeling of Stress: Not at all  Social Connections:  Moderately Integrated (09/17/2024)   Social Connection and Isolation Panel    Frequency of Communication with Friends and Family: More than three times a week    Frequency of Social Gatherings with Friends and Family: Twice a week    Attends Religious Services: Never    Database Administrator or Organizations: Yes    Attends Engineer, Structural: More than 4 times per year    Marital Status: Married  Catering Manager Violence:  Not on file    Review of Systems Per HPI.   Objective:   Vitals:   09/18/24 0841  BP: 122/78  Pulse: 81  Temp: 98.8 F (37.1 C)  TempSrc: Temporal  SpO2: 97%  Weight: 171 lb (77.6 kg)  Height: 5' (1.524 m)     Physical Exam Exam conducted with a chaperone present.  Constitutional:      General: She is not in acute distress.    Appearance: Normal appearance. She is well-developed.  HENT:     Head: Normocephalic and atraumatic.  Cardiovascular:     Rate and Rhythm: Normal rate.  Pulmonary:     Effort: Pulmonary effort is normal.  Skin:    Comments: Left lower buttocks, thigh with approximately 1 cm soft lesion just under the skin.  No skin erythema, no fluctuance, nontender. Chaperone Meighan.   Neurological:     Mental Status: She is alert and oriented to person, place, and time.  Psychiatric:        Mood and Affect: Mood normal.      Assessment & Plan:  [name] is a 64 y.o. female . Lipoma of left lower extremity  - Suspected lipoma of left lower buttocks, thigh area.  No concerning findings on exam.  She does have a dermatologist and is due for checkup including skin cancer screening, plans for second opinion at dermatology I do not recommend any treatment at this time.  Continue monitoring with RTC precautions given.  Need for pneumococcal vaccination - Plan: Pneumococcal conjugate vaccine 20-valent (Prevnar 20)  - Prevnar 20 given.  No orders of the defined types were placed in this encounter.  Patient  Instructions  I agree, I think the area under the skin appears to be a lipoma or benign condition, especially without any changes.  I do recommend follow-up with dermatology and make sure they agree.  Increased size, redness, or other new symptoms happy to see you prior to seeing dermatology but I do not expect that to happen.  No new vaccine today.  You should have enough Synthroid  until follow-up for physical in the next 3 months but let me know if other refills needed prior to that time.  Take care!    Signed,   Reyes Pines, MD [facility], [facility] 09/18/24 9:16 AM

## 2024-09-19 ENCOUNTER — Ambulatory Visit (INDEPENDENT_AMBULATORY_CARE_PROVIDER_SITE_OTHER): Payer: Self-pay | Admitting: Adult Health

## 2024-09-19 ENCOUNTER — Encounter (INDEPENDENT_AMBULATORY_CARE_PROVIDER_SITE_OTHER): Payer: Self-pay | Admitting: Adult Health

## 2024-09-19 VITALS — BP 124/81 | HR 71 | Temp 98.8°F | Ht 60.0 in | Wt 167.0 lb

## 2024-09-19 DIAGNOSIS — M2350 Chronic instability of knee, unspecified knee: Secondary | ICD-10-CM

## 2024-09-19 DIAGNOSIS — E66811 Obesity, class 1: Secondary | ICD-10-CM

## 2024-09-19 DIAGNOSIS — R7303 Prediabetes: Secondary | ICD-10-CM | POA: Diagnosis not present

## 2024-09-19 DIAGNOSIS — Z6832 Body mass index (BMI) 32.0-32.9, adult: Secondary | ICD-10-CM

## 2024-09-19 DIAGNOSIS — R632 Polyphagia: Secondary | ICD-10-CM | POA: Diagnosis not present

## 2024-09-19 DIAGNOSIS — E559 Vitamin D deficiency, unspecified: Secondary | ICD-10-CM

## 2024-09-19 DIAGNOSIS — E669 Obesity, unspecified: Secondary | ICD-10-CM

## 2024-09-19 MED ORDER — METFORMIN HCL ER 500 MG PO TB24: ORAL_TABLET | ORAL | 0 refills | Status: AC

## 2024-09-19 NOTE — Progress Notes (Signed)
 WEIGHT SUMMARY AND BIOMETRICS  Vitals Temp: 98.8 F (37.1 C) BP: 124/81 Pulse Rate: 71 SpO2: 99 %   Anthropometric Measurements Height: 5' (1.524 m) Weight: 167 lb (75.8 kg) BMI (Calculated): 32.62 Weight at Last Visit: 168lb Weight Lost Since Last Visit: 1lb Weight Gained Since Last Visit: 0lb Starting Weight: 177lb Total Weight Loss (lbs): 10 lb (4.536 kg)   Body Composition  Body Fat %: 39.6 % Fat Mass (lbs): 66.2 lbs Muscle Mass (lbs): 95.8 lbs Total Body Water (lbs): 67.8 lbs Visceral Fat Rating : 11   Other Clinical Data Fasting: No Labs: No Today's Visit #: 91 Starting Date: 04/03/20    Chief Complaint:   OBESITY [name] is here to discuss her progress with her obesity treatment plan.  She is on the keeping a food journal and adhering to recommended goals of 1200 calories and 90g+ protein and states she is following her eating plan approximately 90 % of the time.  She states she is exercising Swimming 30 minutes 4 times per week.  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 Her husband will complete his consulting contract with [employer] Dec 2025- he will then FULLY retire.  [name] reports fantastic reduction in chronic back pain, which has allowed her to increase regular exercise (swimming).  She continue to experienced bil knee pain- she has upcoming OV with established Orthopedic Specialist.  Reviewed Bioimpedance Results with pt: Muscle Mass: +0.2 lb Adipose Mass: -1.6 lbs  Subjective:   1. Polyphagia She reports stable appetite levels the last several weeks. She reports reduction in overall stress levels as she and her husband will FULL retire this quarter. She is on Metformin  XR 500mg - 1 tab with breakfast and 2 tabs with dinner.  2. Vitamin D  deficiency  Latest Reference Range & Units 05/04/23 09:49 11/08/23 09:15 02/21/24 11:00  Vitamin D ,  25-Hydroxy 30.0 - 100.0 ng/mL 40.7 50.7 81.6   She is on bi-weekly Ergocalciferol   3. Pre-diabetes Lab Results  Component Value Date   HGBA1C 5.6 02/21/2024   HGBA1C 5.9 (H) 11/08/2023   HGBA1C 5.9 (H) 05/04/2023     Latest Reference Range & Units 06/15/24 08:40  GFR, Estimated >60 mL/min >60    Latest Reference Range & Units 06/15/24 08:40  CO2 22 - 32 mmol/L 25   She reports stable appetite levels the last several weeks. She reports reduction in overall stress levels as she and her husband will FULL retire this quarter. She is on Metformin  XR 500mg - 1 tab with breakfast and 2 tabs with dinner.  4. Recurrent instability of knee joint, unspecified laterality She reports chronic bilateral knee pain, L knee more significant more R knee  05/11/2023  Show images for XR KNEE 3 VIEW LEFT Result Narrative  X-rays demonstrate moderate degenerative changes to the medial and patellofemoral compartments  Assessment/Plan:   1. Polyphagia Refill metFORMIN  (GLUCOPHAGE -XR) 500 MG 24 hr tablet 1 tab with breakfast and 2 tabs with dinner Dispense: 90 tablet, Refills: 0 ordered   2. Vitamin D  deficiency (Primary) Monitor Labs Denies need for refill of Ergocalciferol  today  3. Pre-diabetes Refill metFORMIN  (GLUCOPHAGE -XR) 500 MG 24 hr tablet 1 tab with breakfast and 2 tabs with dinner Dispense: 90 tablet, Refills: 0 ordered   4. Recurrent instability of knee joint, unspecified laterality Continue with weigh loss efforts F/u with established Orthopedic Specialist  5. Obesity (BMI 30.0-34.9), CURRENT BMI 32.7  [name] is currently in the action stage of change. As such, her goal is to continue with weight loss efforts. She has agreed to keeping a food journal and adhering to recommended goals of 1200 calories and 90g+ protein.   Exercise goals: For substantial health benefits, adults should do at least 150 minutes (2 hours and 30 minutes) a week of moderate-intensity, or 75 minutes (1  hour and 15 minutes) a week of vigorous-intensity aerobic physical activity, or an equivalent combination of moderate- and vigorous-intensity aerobic activity. Aerobic activity should be performed in episodes of at least 10 minutes, and preferably, it should be spread throughout the week.  Behavioral modification strategies: increasing lean protein intake, decreasing simple carbohydrates, increasing vegetables, increasing water intake, no skipping meals, meal planning and cooking strategies, keeping healthy foods in the home, ways to avoid boredom eating, and planning for success.  Chari has agreed to follow-up with our clinic in 2 weeks. She was informed of the importance of frequent follow-up visits to maximize her success with intensive lifestyle modifications for her multiple health conditions.   Check Fasting Labs at next OV  Objective:   Blood pressure 124/81, pulse 71, temperature 98.8 F (37.1 C), height 5' (1.524 m), weight 167 lb (75.8 kg), SpO2 99%. Body mass index is 32.61 kg/m.  General: Cooperative, alert, well developed, in no acute distress. HEENT: Conjunctivae and lids unremarkable. Cardiovascular: Regular rhythm.  Lungs: Normal work of breathing. Neurologic: No focal deficits.   Lab Results  Component Value Date   CREATININE 0.88 06/15/2024   BUN 24 (H) 06/15/2024   NA 138 06/15/2024   K 4.3 06/15/2024   CL 105 06/15/2024   CO2 25 06/15/2024   Lab Results  Component Value Date   ALT 17 02/21/2024   AST 18 02/21/2024   ALKPHOS 59 02/21/2024   BILITOT 0.4 02/21/2024   Lab Results  Component Value Date   HGBA1C 5.6 02/21/2024   HGBA1C 5.9 (H) 11/08/2023   HGBA1C 5.9 (H) 05/04/2023   HGBA1C 5.6 12/16/2022   HGBA1C 5.6 09/09/2022   Lab Results  Component Value Date   INSULIN  5.4 02/21/2024   INSULIN  14.8 11/08/2023   INSULIN  8.8 05/04/2023   INSULIN  6.5 12/16/2022   INSULIN  6.5 09/09/2022   Lab Results  Component Value Date   TSH 1.20 12/30/2023    Lab Results  Component Value Date   CHOL 259 (H) 09/09/2022   HDL 62 09/09/2022   LDLCALC 145 (H) 09/09/2022   TRIG 287 (H) 09/09/2022   CHOLHDL 4.2 09/09/2022   Lab Results  Component Value Date   VD25OH 81.6 02/21/2024   VD25OH 50.7 11/08/2023   VD25OH 40.7 05/04/2023   Lab Results  Component Value Date   WBC 7.9 06/15/2024   HGB 15.8 (H) 06/15/2024   HCT 46.2 (H) 06/15/2024   MCV 94.5 06/15/2024   PLT 368 06/15/2024   No results found for: IRON, TIBC, FERRITIN  Attestation Statements:   Reviewed by clinician on day of visit: allergies, medications, problem list, medical history, surgical history, family history, social history, and previous encounter notes.  I have reviewed the above documentation for accuracy and completeness, and I agree with the above. -  Javonna Balli d. Mavis Fichera, NP-C

## 2024-09-21 ENCOUNTER — Other Ambulatory Visit (INDEPENDENT_AMBULATORY_CARE_PROVIDER_SITE_OTHER): Payer: Self-pay

## 2024-09-21 ENCOUNTER — Ambulatory Visit (INDEPENDENT_AMBULATORY_CARE_PROVIDER_SITE_OTHER): Admitting: Physician Assistant

## 2024-09-21 DIAGNOSIS — M1712 Unilateral primary osteoarthritis, left knee: Secondary | ICD-10-CM

## 2024-09-21 DIAGNOSIS — M1711 Unilateral primary osteoarthritis, right knee: Secondary | ICD-10-CM

## 2024-09-21 DIAGNOSIS — M170 Bilateral primary osteoarthritis of knee: Secondary | ICD-10-CM | POA: Diagnosis not present

## 2024-09-21 MED ORDER — BUPIVACAINE HCL 0.25 % IJ SOLN
0.6600 mL | INTRAMUSCULAR | Status: AC | PRN
  Administered 2024-09-21: .66 mL via INTRA_ARTICULAR

## 2024-09-21 MED ORDER — LIDOCAINE HCL 1 % IJ SOLN
3.0000 mL | INTRAMUSCULAR | Status: AC | PRN
  Administered 2024-09-21: 3 mL

## 2024-09-21 MED ORDER — METHYLPREDNISOLONE ACETATE 40 MG/ML IJ SUSP
13.3300 mg | INTRAMUSCULAR | Status: AC | PRN
  Administered 2024-09-21: 13.33 mg via INTRA_ARTICULAR

## 2024-09-21 NOTE — Progress Notes (Signed)
 Office Visit Note   Patient: [name]           Date of Birth: Jul 22, 1961           MRN: [id number] Visit Date: 09/21/2024              Requested by: Levora Reyes SAUNDERS, MD [id number] [place],  [place] [address] PCP: Levora Reyes SAUNDERS, MD   Assessment & Plan: Visit Diagnoses:  1. Bilateral primary osteoarthritis of knee     Plan: Impression is bilateral knee osteoarthritis.  Today, we discussed various treatment options to include repeat cortisone injections for which she would like to proceed.  We have also discussed viscosupplementation injections.  She will follow-up as needed.  This patient is diagnosed with osteoarthritis of the knee(s).    Radiographs show evidence of joint space narrowing, osteophytes, subchondral sclerosis and/or subchondral cysts.  This patient has knee pain which interferes with functional and activities of daily living.    This patient has experienced inadequate response, adverse effects and/or intolerance with conservative treatments such as acetaminophen , NSAIDS, topical creams, physical therapy or regular exercise, knee bracing and/or weight loss.   This patient has experienced inadequate response or has a contraindication to intra articular steroid injections for at least 3 months.   This patient is not scheduled to have a total knee replacement within 6 months of starting treatment with viscosupplementation.   Follow-Up Instructions: Return if symptoms worsen or fail to improve.   Orders:  Orders Placed This Encounter  Procedures   Large Joint Inj: bilateral knee   XR KNEE 3 VIEW LEFT   XR KNEE 3 VIEW RIGHT   No orders of the defined types were placed in this encounter.     Procedures: Large Joint Inj: bilateral knee on 09/21/2024 10:25 AM Indications: pain Details: 22 G needle, anterolateral approach Medications (Right): 0.66 mL bupivacaine  0.25 %; 3 mL lidocaine  1 %; 13.33 mg methylPREDNISolone  acetate 40  MG/ML Medications (Left): 0.66 mL bupivacaine  0.25 %; 3 mL lidocaine  1 %; 13.33 mg methylPREDNISolone  acetate 40 MG/ML      Clinical Data: No additional findings.   Subjective: Chief Complaint  Patient presents with   Right Knee - Pain   Left Knee - Pain    HPI patient is a pleasant 63 year old female who comes in today with recurrent bilateral knee pain left greater than right.  History of underlying osteoarthritis which has been injected with cortisone in the past.  Last cortisone injections to both knees were in January 2025.  She is unsure how much and how long the injections helped as she was concurrently having lumbar pain and subsequently underwent lumbar spine surgery by Dr. Georgina.  The pain she is currently having is to both knees with the left 1 significantly worse than the right.  Left 1 is constant.  Pain is located the medial aspect.  Symptoms are worse with stair climbing as well as pivoting and getting in and out of the car.  Right medial knee pain primarily occurs with stair climbing.  She is not taking anything for the pain.  Requesting repeat cortisone injections today.  Review of Systems as detailed in HPI.  All others reviewed and are negative.   Objective: Vital Signs: There were no vitals taken for this visit.  Physical Exam well-developed well-nourished female in no acute distress.  Alert and oriented x 3.  Ortho Exam bilateral knee exam: Trace effusion.  Marked patellofemoral crepitus.  Range of motion 0 to 115 degrees.  Medial joint line tenderness.  She is neurovascular intact distally.  Specialty Comments:  MRI LUMBAR SPINE WITHOUT CONTRAST   TECHNIQUE: Multiplanar, multisequence MR imaging of the lumbar spine was performed. No intravenous contrast was administered.   COMPARISON:  MR L Spine 11/22/12   FINDINGS: Segmentation:  Standard.   Alignment:  Grade 1 anterolisthesis of L4 on L5.   Vertebrae: No fracture, evidence of discitis, or bone  lesion. There are fusion rods spanning T12-L3   Conus medullaris and cauda equina: Conus extends to the L1-L2 disc space level. Conus and cauda equina appear normal.   Paraspinal and other soft tissues: There is a 11 mm T2 hyperintense lesion along the posterior aspect of the left kidney with somewhat indistinct origins (series 105, image 6). This most likely represents a renal cyst, but further evaluation with a dedicated renal ultrasound is recommended for more definitive characterization.   Disc levels:   Detailed characterization of the degree of degenerative change is slightly limited due to susceptibility artifact from metallic fusion hardware   T12-L1: No evidence of spinal canal or neural foraminal narrowing. No significant disc bulge.   L1-L2: No evidence of spinal canal narrowing. No significant disc bulge. No neural foraminal narrowing.   L2-L3: Incompletely assessed due to the degree of susceptibility artifact. No evidence of neural foraminal narrowing.   L3-L4: Incompletely assessed due to the degree of susceptibility artifact.   L4-L5: Severe bilateral facet degenerative change. Circumferential disc bulge. Severe spinal canal stenosis. Moderate to severe left and moderate right neural foraminal narrowing.   L5-S1: Severe left and moderate right facet degenerative change. Eccentric right disc bulge. Ligamentum flavum hypertrophy. No significant spinal canal narrowing. Moderate to severe right and moderate left neural foraminal narrowing.   IMPRESSION: Detailed characterization of the degree of degenerative change is slightly limited due to susceptibility artifact from metallic fusion hardware   1. Severe spinal canal stenosis at L4-L5 secondary to a combination of a disc bulge and ligamentum flavum hypertrophy. This has slightly progressed compared to 11/22/12 2. Moderate to severe neural foraminal narrowing at L4-L5 (left) and L5-S1 (right). This has  progressed from 11/22/12. 3. There is an 11 mm T2 hyperintense lesion along the posterior aspect of the left kidney with somewhat indistinct origins. This most likely represents a simple renal cyst, but further evaluation with a dedicated renal ultrasound is recommended for more definitive characterization.     Electronically Signed   By: Lyndall Gore M.D.   On: 12/01/2023 07:00  Imaging: No results found.   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   Anemia    Back pain    Blood transfusion without reported diagnosis    post-operatively in 20s after L1 fracture with spinal cord injury after horseback riding.   Chronic kidney disease    Tuberous Sclerosis; [facility]er at [facility].   Edema of both  lower extremities    Hyperlipidemia    Hypertension    Hypothyroidism    Lower back pain    Osteoarthritis    bilateral knees   Pre-diabetes    Thyroid  disease    Vitamin D  deficiency     Family History  Problem Relation Age of Onset   Thyroid  disease Mother    Arthritis Mother    Asthma Mother    Migraines Mother    High blood pressure Mother    Sleep apnea Mother    Obesity Mother    Heart failure Father    Hyperlipidemia Father    Heart disease Father 71       AMI age 75; CABG at 37.   Arthritis Father    High Cholesterol Father    AAA (abdominal aortic aneurysm) Father    BRCA 1/2 Neg Hx    Breast  cancer Neg Hx     Past Surgical History:  Procedure Laterality Date   ABDOMINAL HYSTERECTOMY     DUB; ovaries remaining.   CESAREAN SECTION     DECOMPRESSIVE LUMBAR LAMINECTOMY LEVEL 1 N/A 06/27/2024   Procedure: DECOMPRESSIVE LUMBAR FOUR-LUMBAR FIVE LAMINECTOMY;  Surgeon: Georgina Ozell LABOR, MD;  Location: [facility] OR;  Service: Orthopedics;  Laterality: N/A;   MENISCUS REPAIR     Sleep Study  12/17/2010   No sleep disordered breathing or periodic limb mvmets.  Excess light sleep.   SPINE SURGERY     L1 fracture with spinal cord injury with horseback riding.   TOTAL VAGINAL HYSTERECTOMY  2008   TUBAL LIGATION     Social History   Occupational History   Occupation: Tourist Information Centre Manager  Tobacco Use   Smoking status: Never   Smokeless tobacco: Never  Vaping Use   Vaping status: Never Used  Substance and Sexual Activity   Alcohol use: Yes    Comment: occ   Drug use: No   Sexual activity: Yes    Birth control/protection: None

## 2024-09-25 ENCOUNTER — Ambulatory Visit: Admitting: Orthopedic Surgery

## 2024-09-25 ENCOUNTER — Other Ambulatory Visit (INDEPENDENT_AMBULATORY_CARE_PROVIDER_SITE_OTHER)

## 2024-09-25 DIAGNOSIS — Z9889 Other specified postprocedural states: Secondary | ICD-10-CM

## 2024-09-25 NOTE — Progress Notes (Signed)
 Orthopedic Surgery Post-operative Office Visit   Procedure: L4/5 laminectomy Date of Surgery: 06/27/2024 (~3 months post-op)   Assessment: Patient is a 63 y.o. who is doing well after surgery     Plan: -No spine specific precautions at this time -Pain management: tylenol  as needed -Return to office in 3 months, x-rays needed at next visit: none   ___________________________________________________________________________     Subjective: Patient reports that she is doing great. She is not having any back or radiating leg pain. She does have bilateral knee pain and recently got injections. She is doing well since the injections.     Objective:   General: no acute distress, appropriate affect Neurologic: alert, answering questions appropriately, following commands Respiratory: unlabored breathing on room air Skin: incision is well healed   MSK (spine):   -Strength exam                                                   Left                  Right   EHL                              5/5                  5/5 TA                                 5/5                  5/5 GSC                             5/5                  5/5 Knee extension            5/5                  5/5 Hip flexion                    5/5                  5/5   -Sensory exam                           Sensation intact to light touch in L3-S1 nerve distributions of bilateral lower extremities   Imaging: XRs of the lumbar spine from 09/25/2024 were independently reviewed and interpreted, showing grade 1 spondylolisthesis and disc height loss at L4/5. Anterior osteophyte formation seen at L3/4 and L4/5. No evidence of instability on flexion/extension views. Chronic fracture seen at L1. No other fracture seen. No dislocation seen. Harrington rods in place across the thoracolumbar junction. Laminectomy defect at L4/5.     Patient name: [name] Patient MRN: [id number] Date of visit: 09/25/24

## 2024-10-03 ENCOUNTER — Ambulatory Visit (INDEPENDENT_AMBULATORY_CARE_PROVIDER_SITE_OTHER): Payer: Self-pay | Admitting: Adult Health

## 2024-10-19 ENCOUNTER — Ambulatory Visit (INDEPENDENT_AMBULATORY_CARE_PROVIDER_SITE_OTHER): Admitting: Adult Health

## 2024-12-15 ENCOUNTER — Other Ambulatory Visit: Payer: Self-pay | Admitting: Family Medicine

## 2024-12-15 DIAGNOSIS — I10 Essential (primary) hypertension: Secondary | ICD-10-CM

## 2024-12-27 ENCOUNTER — Ambulatory Visit: Admitting: Orthopedic Surgery

## 2025-01-04 ENCOUNTER — Encounter: Admitting: Family Medicine
